# Patient Record
Sex: Female | Born: 1935 | Race: White | Hispanic: No | Smoking: Former smoker
Health system: Southern US, Community
[De-identification: ages and names within clinical notes are randomized; demographics above are authoritative.]

## PROBLEM LIST (undated history)

## (undated) DIAGNOSIS — R410 Disorientation, unspecified: Secondary | ICD-10-CM

## (undated) DIAGNOSIS — F410 Panic disorder [episodic paroxysmal anxiety] without agoraphobia: Secondary | ICD-10-CM

## (undated) DIAGNOSIS — F32A Depression, unspecified: Secondary | ICD-10-CM

## (undated) DIAGNOSIS — J449 Chronic obstructive pulmonary disease, unspecified: Secondary | ICD-10-CM

## (undated) DIAGNOSIS — M1990 Unspecified osteoarthritis, unspecified site: Secondary | ICD-10-CM

## (undated) DIAGNOSIS — N289 Disorder of kidney and ureter, unspecified: Secondary | ICD-10-CM

## (undated) DIAGNOSIS — I509 Heart failure, unspecified: Secondary | ICD-10-CM

## (undated) DIAGNOSIS — E873 Alkalosis: Secondary | ICD-10-CM

## (undated) DIAGNOSIS — F419 Anxiety disorder, unspecified: Secondary | ICD-10-CM

## (undated) DIAGNOSIS — K529 Noninfective gastroenteritis and colitis, unspecified: Secondary | ICD-10-CM

## (undated) DIAGNOSIS — M109 Gout, unspecified: Secondary | ICD-10-CM

## (undated) DIAGNOSIS — F329 Major depressive disorder, single episode, unspecified: Secondary | ICD-10-CM

## (undated) DIAGNOSIS — F0390 Unspecified dementia without behavioral disturbance: Secondary | ICD-10-CM

## (undated) DIAGNOSIS — I10 Essential (primary) hypertension: Secondary | ICD-10-CM

## (undated) HISTORY — PX: INGUINAL HERNIA REPAIR: SHX194

---

## 2003-03-04 ENCOUNTER — Emergency Department (HOSPITAL_COMMUNITY): Admission: EM | Admit: 2003-03-04 | Discharge: 2003-03-04 | Payer: Self-pay | Admitting: Emergency Medicine

## 2006-03-23 ENCOUNTER — Ambulatory Visit (HOSPITAL_COMMUNITY): Admission: RE | Admit: 2006-03-23 | Discharge: 2006-03-23 | Payer: Self-pay | Admitting: Pulmonary Disease

## 2008-06-05 ENCOUNTER — Emergency Department (HOSPITAL_COMMUNITY): Admission: EM | Admit: 2008-06-05 | Discharge: 2008-06-05 | Payer: Self-pay | Admitting: Emergency Medicine

## 2008-08-04 ENCOUNTER — Ambulatory Visit (HOSPITAL_COMMUNITY): Admission: RE | Admit: 2008-08-04 | Discharge: 2008-08-04 | Payer: Self-pay | Admitting: Pulmonary Disease

## 2008-09-04 ENCOUNTER — Ambulatory Visit: Payer: Self-pay | Admitting: Surgery

## 2008-11-06 ENCOUNTER — Ambulatory Visit (HOSPITAL_COMMUNITY): Admission: RE | Admit: 2008-11-06 | Discharge: 2008-11-06 | Payer: Self-pay | Admitting: Pulmonary Disease

## 2009-03-05 ENCOUNTER — Ambulatory Visit: Payer: Self-pay | Admitting: Surgery

## 2009-06-22 ENCOUNTER — Ambulatory Visit (HOSPITAL_COMMUNITY): Admission: RE | Admit: 2009-06-22 | Discharge: 2009-06-22 | Payer: Self-pay | Admitting: Orthopaedic Surgery

## 2009-08-30 ENCOUNTER — Emergency Department (HOSPITAL_COMMUNITY)
Admission: EM | Admit: 2009-08-30 | Discharge: 2009-08-30 | Payer: Self-pay | Source: Home / Self Care | Admitting: Emergency Medicine

## 2009-09-24 ENCOUNTER — Ambulatory Visit (HOSPITAL_COMMUNITY)
Admission: RE | Admit: 2009-09-24 | Discharge: 2009-09-24 | Payer: Self-pay | Source: Home / Self Care | Admitting: Orthopaedic Surgery

## 2010-01-17 ENCOUNTER — Ambulatory Visit (HOSPITAL_COMMUNITY)
Admission: RE | Admit: 2010-01-17 | Discharge: 2010-01-17 | Payer: Self-pay | Source: Home / Self Care | Attending: Pulmonary Disease | Admitting: Pulmonary Disease

## 2010-01-21 ENCOUNTER — Inpatient Hospital Stay (HOSPITAL_COMMUNITY)
Admission: AD | Admit: 2010-01-21 | Discharge: 2010-01-24 | Payer: Self-pay | Source: Home / Self Care | Attending: Pulmonary Disease | Admitting: Pulmonary Disease

## 2010-01-22 ENCOUNTER — Encounter (INDEPENDENT_AMBULATORY_CARE_PROVIDER_SITE_OTHER): Payer: Self-pay | Admitting: Pulmonary Disease

## 2010-01-23 LAB — COMPREHENSIVE METABOLIC PANEL
ALT: 67 U/L — ABNORMAL HIGH (ref 0–35)
AST: 54 U/L — ABNORMAL HIGH (ref 0–37)
Albumin: 3.7 g/dL (ref 3.5–5.2)
Alkaline Phosphatase: 80 U/L (ref 39–117)
BUN: 33 mg/dL — ABNORMAL HIGH (ref 6–23)
CO2: 30 mEq/L (ref 19–32)
Calcium: 9.3 mg/dL (ref 8.4–10.5)
Chloride: 87 mEq/L — ABNORMAL LOW (ref 96–112)
Creatinine, Ser: 1.11 mg/dL (ref 0.4–1.2)
GFR calc Af Amer: 57 mL/min — ABNORMAL LOW (ref >60)
GFR calc non Af Amer: 47 mL/min — ABNORMAL LOW (ref >60)
Glucose, Bld: 134 mg/dL — ABNORMAL HIGH (ref 70–99)
Potassium: 2.7 mEq/L — CL (ref 3.5–5.1)
Sodium: 130 mEq/L — ABNORMAL LOW (ref 135–145)
Total Bilirubin: 0.8 mg/dL (ref 0.3–1.2)
Total Protein: 6.6 g/dL (ref 6.0–8.3)

## 2010-01-23 LAB — BLOOD GAS, ARTERIAL
Acid-Base Excess: 3.6 mmol/L — ABNORMAL HIGH (ref 0.0–2.0)
Bicarbonate: 26.7 mEq/L — ABNORMAL HIGH (ref 20.0–24.0)
FIO2: 0.21 %
O2 Content: 21 L/min
O2 Saturation: 95.2 %
Patient temperature: 37
TCO2: 23.1 mmol/L (ref 0–100)
pCO2 arterial: 34 mmHg — ABNORMAL LOW (ref 35.0–45.0)
pH, Arterial: 7.506 — ABNORMAL HIGH (ref 7.350–7.400)
pO2, Arterial: 67.5 mmHg — ABNORMAL LOW (ref 80.0–100.0)

## 2010-01-23 LAB — URINALYSIS, ROUTINE W REFLEX MICROSCOPIC
Bilirubin Urine: NEGATIVE
Ketones, ur: NEGATIVE mg/dL
Leukocytes, UA: NEGATIVE
Nitrite: NEGATIVE
Protein, ur: 100 mg/dL — AB
Specific Gravity, Urine: 1.02 (ref 1.005–1.030)
Urine Glucose, Fasting: NEGATIVE mg/dL
Urobilinogen, UA: 0.2 mg/dL (ref 0.0–1.0)
pH: 7.5 (ref 5.0–8.0)

## 2010-01-23 LAB — BASIC METABOLIC PANEL
BUN: 30 mg/dL — ABNORMAL HIGH (ref 6–23)
CO2: 33 mEq/L — ABNORMAL HIGH (ref 19–32)
Calcium: 9.2 mg/dL (ref 8.4–10.5)
Chloride: 92 mEq/L — ABNORMAL LOW (ref 96–112)
Creatinine, Ser: 1.21 mg/dL — ABNORMAL HIGH (ref 0.4–1.2)
GFR calc Af Amer: 52 mL/min — ABNORMAL LOW (ref >60)
GFR calc non Af Amer: 43 mL/min — ABNORMAL LOW (ref >60)
Glucose, Bld: 87 mg/dL (ref 70–99)
Potassium: 2.9 mEq/L — ABNORMAL LOW (ref 3.5–5.1)
Sodium: 134 mEq/L — ABNORMAL LOW (ref 135–145)

## 2010-01-23 LAB — GLUCOSE, CAPILLARY
Glucose-Capillary: 140 mg/dL — ABNORMAL HIGH (ref 70–99)
Glucose-Capillary: 114 mg/dL — ABNORMAL HIGH (ref 70–99)
Glucose-Capillary: 96 mg/dL (ref 70–99)
Glucose-Capillary: 100 mg/dL — ABNORMAL HIGH (ref 70–99)
Glucose-Capillary: 131 mg/dL — ABNORMAL HIGH (ref 70–99)
Glucose-Capillary: 110 mg/dL — ABNORMAL HIGH (ref 70–99)

## 2010-01-23 LAB — CBC
HCT: 37 % (ref 36.0–46.0)
Hemoglobin: 12.9 g/dL (ref 12.0–15.0)
MCH: 30.1 pg (ref 26.0–34.0)
MCHC: 34.9 g/dL (ref 30.0–36.0)
MCV: 86.4 fL (ref 78.0–100.0)
Platelets: 181 10*3/uL (ref 150–400)
RBC: 4.28 MIL/uL (ref 3.87–5.11)
RDW: 14.3 % (ref 11.5–15.5)
WBC: 12.9 10*3/uL — ABNORMAL HIGH (ref 4.0–10.5)

## 2010-01-23 LAB — AMMONIA: Ammonia: 25 umol/L (ref 11–35)

## 2010-01-23 LAB — DIFFERENTIAL
Basophils Absolute: 0 10*3/uL (ref 0.0–0.1)
Basophils Relative: 0 % (ref 0–1)
Eosinophils Absolute: 0 10*3/uL (ref 0.0–0.7)
Eosinophils Relative: 0 % (ref 0–5)
Lymphocytes Relative: 6 % — ABNORMAL LOW (ref 12–46)
Lymphs Abs: 0.7 10*3/uL (ref 0.7–4.0)
Monocytes Absolute: 1.1 10*3/uL — ABNORMAL HIGH (ref 0.1–1.0)
Monocytes Relative: 8 % (ref 3–12)
Neutro Abs: 11.1 10*3/uL — ABNORMAL HIGH (ref 1.7–7.7)
Neutrophils Relative %: 86 % — ABNORMAL HIGH (ref 43–77)

## 2010-01-23 LAB — RPR: RPR Ser Ql: NONREACTIVE

## 2010-01-23 LAB — URINE MICROSCOPIC-ADD ON

## 2010-01-23 LAB — MAGNESIUM: Magnesium: 1.9 mg/dL (ref 1.5–2.5)

## 2010-01-23 LAB — TSH: TSH: 2.161 u[IU]/mL (ref 0.350–4.500)

## 2010-01-23 LAB — BRAIN NATRIURETIC PEPTIDE: Pro B Natriuretic peptide (BNP): 2350 pg/mL — ABNORMAL HIGH (ref 0.0–100.0)

## 2010-01-23 LAB — VITAMIN B12: Vitamin B-12: 934 pg/mL — ABNORMAL HIGH (ref 211–911)

## 2010-01-24 ENCOUNTER — Inpatient Hospital Stay
Admission: AD | Admit: 2010-01-24 | Discharge: 2010-03-11 | Disposition: A | Discharge status: Other Acute Inpatient Hospital | Payer: Medicare Other | Attending: Internal Medicine | Admitting: Internal Medicine

## 2010-01-28 LAB — GLUCOSE, CAPILLARY
Glucose-Capillary: 97 mg/dL (ref 70–99)
Glucose-Capillary: 128 mg/dL — ABNORMAL HIGH (ref 70–99)
Glucose-Capillary: 160 mg/dL — ABNORMAL HIGH (ref 70–99)
Glucose-Capillary: 105 mg/dL — ABNORMAL HIGH (ref 70–99)
Glucose-Capillary: 137 mg/dL — ABNORMAL HIGH (ref 70–99)

## 2010-01-28 LAB — HOMOCYSTEINE: Homocysteine: 15.4 umol/L (ref 4.0–15.4)

## 2010-01-28 LAB — BASIC METABOLIC PANEL
BUN: 22 mg/dL (ref 6–23)
BUN: 20 mg/dL (ref 6–23)
Chloride: 94 mEq/L — ABNORMAL LOW (ref 96–112)
Chloride: 96 mEq/L (ref 96–112)
GFR calc non Af Amer: 51 mL/min — ABNORMAL LOW (ref >60)
GFR calc non Af Amer: 60 mL/min — ABNORMAL LOW (ref >60)
Glucose, Bld: 86 mg/dL (ref 70–99)
Potassium: 4.2 mEq/L (ref 3.5–5.1)
Potassium: 4.5 mEq/L (ref 3.5–5.1)
Sodium: 133 mEq/L — ABNORMAL LOW (ref 135–145)
Sodium: 131 mEq/L — ABNORMAL LOW (ref 135–145)

## 2010-01-28 LAB — URINE CULTURE
Culture  Setup Time: 201201171215
Special Requests: POSITIVE

## 2010-01-28 LAB — BRAIN NATRIURETIC PEPTIDE: Pro B Natriuretic peptide (BNP): 851 pg/mL — ABNORMAL HIGH (ref 0.0–100.0)

## 2010-01-29 LAB — GLUCOSE, CAPILLARY
Glucose-Capillary: 115 mg/dL — ABNORMAL HIGH (ref 70–99)
Glucose-Capillary: 113 mg/dL — ABNORMAL HIGH (ref 70–99)
Glucose-Capillary: 107 mg/dL — ABNORMAL HIGH (ref 70–99)
Glucose-Capillary: 109 mg/dL — ABNORMAL HIGH (ref 70–99)
Glucose-Capillary: 117 mg/dL — ABNORMAL HIGH (ref 70–99)
Glucose-Capillary: 184 mg/dL — ABNORMAL HIGH (ref 70–99)
Glucose-Capillary: 109 mg/dL — ABNORMAL HIGH (ref 70–99)
Glucose-Capillary: 107 mg/dL — ABNORMAL HIGH (ref 70–99)

## 2010-01-30 LAB — GLUCOSE, CAPILLARY
Glucose-Capillary: 107 mg/dL — ABNORMAL HIGH (ref 70–99)
Glucose-Capillary: 117 mg/dL — ABNORMAL HIGH (ref 70–99)
Glucose-Capillary: 98 mg/dL (ref 70–99)
Glucose-Capillary: 106 mg/dL — ABNORMAL HIGH (ref 70–99)
Glucose-Capillary: 91 mg/dL (ref 70–99)

## 2010-01-31 LAB — GLUCOSE, CAPILLARY: Glucose-Capillary: 95 mg/dL (ref 70–99)

## 2010-02-01 LAB — GLUCOSE, CAPILLARY
Glucose-Capillary: 133 mg/dL — ABNORMAL HIGH (ref 70–99)
Glucose-Capillary: 108 mg/dL — ABNORMAL HIGH (ref 70–99)
Glucose-Capillary: 95 mg/dL (ref 70–99)

## 2010-02-02 LAB — GLUCOSE, CAPILLARY: Glucose-Capillary: 107 mg/dL — ABNORMAL HIGH (ref 70–99)
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
## 2010-02-02 NOTE — Progress Notes (Signed)
  NAMESHERITTA, Melissa [name]                ACCOUNT NO.:  [id number]  MEDICAL RECORD NO.:  [id number]          PATIENT TYPE:  INP  LOCATION:  A338                          FACILITY:  [facility]  PHYSICIAN:  Edward L. Juanetta Gosling, M.D.DATE OF BIRTH:  12-18-1925  DATE OF PROCEDURE: DATE OF DISCHARGE:                                PROGRESS NOTE   Melissa [name] was admitted yesterday with congestive heart failure.  She has got Cardiology consultation pending.  She has got a echocardiogram pending.  Dr. Gerilyn Pilgrim has seen her and his initial impression is that she may have a post-concussive confusion and that unfortunately may not resolve.  PHYSICAL EXAMINATION:  VITAL SIGNS:  Her exam now shows temperature is 98.7, pulse 84, respirations 20, blood pressure 148/84, O2 sats 96%.  She is having some difficulty with urination, so I will get a urine C and S and urinalysis and continue with all of her other treatments and follow.  I am not sure if she will be able to go home, unless she has more help than she currently does.     Edward L. Juanetta Gosling, M.D.     ELH/MEDQ  D:  01/22/2010  T:  01/22/2010  Job:  [id number]  Electronically Signed by Kari Baars M.D. on 02/02/2010 10:37:16 AM
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
## 2010-02-02 NOTE — Progress Notes (Signed)
  NAMECEYLIN, DREIBELBIS                ACCOUNT NO.:  [id number]  MEDICAL RECORD NO.:  [id number]          PATIENT TYPE:  INP  LOCATION:  A338                          FACILITY:  [facility]  PHYSICIAN:  Edward L. Juanetta Gosling, M.D.DATE OF BIRTH:  [birth year]  DATE OF PROCEDURE: DATE OF DISCHARGE:                                PROGRESS NOTE   Ms. Dandridge is admitted with confusion, congestive heart failure, and multiple other medical problems.  The help from the Cardiology and Neurology consultants is noted and appreciated.  She became more confused last night.  PHYSICAL EXAMINATION:  VITAL SIGNS:  Her exam this morning shows her temperature is 98.9, pulse 84, respirations 16, blood pressure 160/89, O2 sat 91%, weight 49.2. CHEST:  Clear. HEART:  Regular. ABDOMEN:  Soft.  She has a Foley catheter in now so that we can get accurate I and O and because she has difficulty in getting up and going to the bathroom.  My assessment then is that she is better I think.  Plan is to continue with treatments.  Her MRI did not show a stroke. She is going to have an EEG.  She is going to have continued followup. Her echocardiogram yesterday shows ejection fraction 40-45% with left ventricular hypertrophy.  This is actually somewhat better than I expected.     Edward L. Juanetta Gosling, M.D.     ELH/MEDQ  D:  01/23/2010  T:  01/24/2010  Job:  [id number]  Electronically Signed by Kari Baars M.D. on 02/02/2010 10:37:20 AM

## 2010-02-02 NOTE — Progress Notes (Signed)
  NAMESYNCERE, KAMINSKI                ACCOUNT NO.:  [id number]  MEDICAL RECORD NO.:  [id number]          PATIENT TYPE:  INP  LOCATION:  A338                          FACILITY:  [facility]  PHYSICIAN:  Niel Peretti L. Juanetta Gosling, M.D.DATE OF BIRTH:  1925-08-12  DATE OF PROCEDURE: DATE OF DISCHARGE:  01/24/2010                                PROGRESS NOTE   Ms. Laufer seems to be doing about the same.  She has remained quite confused.  She is much better as far as her congestive heart failure is concerned and her echocardiogram showed better left ventricular function than I expected.  She has no other new complaints or problems.  She says she feels some better.  She has had a EEG and we do not have the results of that back yet, but Dr. Gerilyn Pilgrim plans to read that soon.  PHYSICAL EXAMINATION:  VITAL SIGNS:  Exam today shows that her temperature is 97.4, pulse 90, respirations 20, blood pressure 152/80, O2 sats 94% on room air. CHEST:  Clearer. GENERAL:  She looks better except for her confusion.  Assessment then she has confusion which I think is multifactorial and my plan is to transfer her to skilled care facility today.  Please see discharge summary for details.     Sarea Fyfe L. Juanetta Gosling, M.D.     ELH/MEDQ  D:  01/24/2010  T:  01/25/2010  Job:  [id number]  Electronically Signed by Kari Baars M.D. on 02/02/2010 10:37:28 AM

## 2010-02-03 LAB — GLUCOSE, CAPILLARY
Glucose-Capillary: 115 mg/dL — ABNORMAL HIGH (ref 70–99)
Glucose-Capillary: 135 mg/dL — ABNORMAL HIGH (ref 70–99)

## 2010-02-04 LAB — GLUCOSE, CAPILLARY
Glucose-Capillary: 111 mg/dL — ABNORMAL HIGH (ref 70–99)
Glucose-Capillary: 120 mg/dL — ABNORMAL HIGH (ref 70–99)

## 2010-02-05 LAB — GLUCOSE, CAPILLARY
Glucose-Capillary: 95 mg/dL (ref 70–99)
Glucose-Capillary: 191 mg/dL — ABNORMAL HIGH (ref 70–99)

## 2010-02-06 LAB — GLUCOSE, CAPILLARY
Glucose-Capillary: 112 mg/dL — ABNORMAL HIGH (ref 70–99)
Glucose-Capillary: 101 mg/dL — ABNORMAL HIGH (ref 70–99)

## 2010-02-07 LAB — GLUCOSE, CAPILLARY: Glucose-Capillary: 93 mg/dL (ref 70–99)

## 2010-02-08 ENCOUNTER — Ambulatory Visit (HOSPITAL_COMMUNITY)
Admission: AD | Admit: 2010-02-08 | Discharge: 2010-02-08 | DRG: 951 | Disposition: A | Discharge status: Home / Self Care | Payer: Medicare Other | Source: Ambulatory Visit | Attending: Emergency Medicine | Admitting: Emergency Medicine

## 2010-02-08 ENCOUNTER — Emergency Department (HOSPITAL_COMMUNITY): Admission: EM | Admit: 2010-02-08 | Payer: Medicare Other | Source: Home / Self Care

## 2010-02-08 ENCOUNTER — Inpatient Hospital Stay (HOSPITAL_COMMUNITY)
Admission: AD | Admit: 2010-02-08 | Discharge: 2010-02-08 | Disposition: A | Discharge status: Home / Self Care | Payer: Medicare Other | Source: Ambulatory Visit | Attending: Emergency Medicine | Admitting: Emergency Medicine

## 2010-02-08 ENCOUNTER — Ambulatory Visit (HOSPITAL_COMMUNITY)
Admission: AD | Admit: 2010-02-08 | Discharge: 2010-02-08 | Disposition: A | Discharge status: Home / Self Care | Payer: Medicare Other | Source: Ambulatory Visit | Attending: Emergency Medicine | Admitting: Emergency Medicine

## 2010-02-08 DIAGNOSIS — S0990XA Unspecified injury of head, initial encounter: Secondary | ICD-10-CM

## 2010-02-08 DIAGNOSIS — X58XXXA Exposure to other specified factors, initial encounter: Secondary | ICD-10-CM | POA: Insufficient documentation

## 2010-02-08 DIAGNOSIS — F0390 Unspecified dementia without behavioral disturbance: Secondary | ICD-10-CM | POA: Insufficient documentation

## 2010-02-12 LAB — GLUCOSE, CAPILLARY: Glucose-Capillary: 98 mg/dL (ref 70–99)
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
## 2010-02-25 LAB — GLUCOSE, CAPILLARY: Glucose-Capillary: 115 mg/dL — ABNORMAL HIGH (ref 70–99)

## 2010-03-21 LAB — CBC
HCT: 42.8 % (ref 36.0–46.0)
Hemoglobin: 14.5 g/dL (ref 12.0–15.0)
MCHC: 34 g/dL (ref 30.0–36.0)
RBC: 4.71 MIL/uL (ref 3.87–5.11)
WBC: 7.6 10*3/uL (ref 4.0–10.5)

## 2010-03-21 LAB — DIFFERENTIAL
Basophils Relative: 1 % (ref 0–1)
Lymphocytes Relative: 16 % (ref 12–46)
Monocytes Absolute: 0.6 10*3/uL (ref 0.1–1.0)
Monocytes Relative: 8 % (ref 3–12)
Neutro Abs: 5.6 10*3/uL (ref 1.7–7.7)
Neutrophils Relative %: 74 % (ref 43–77)

## 2010-03-21 LAB — BASIC METABOLIC PANEL
Calcium: 9.1 mg/dL (ref 8.4–10.5)
GFR calc Af Amer: >60 mL/min (ref >60)
GFR calc non Af Amer: >60 mL/min (ref >60)
Glucose, Bld: 96 mg/dL (ref 70–99)
Potassium: 3.1 mEq/L — ABNORMAL LOW (ref 3.5–5.1)
Sodium: 131 mEq/L — ABNORMAL LOW (ref 135–145)
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
## 2010-05-21 NOTE — Assessment & Plan Note (Signed)
OFFICE VISIT   Melissa [name], Melissa [name]  DOB:  03-16-1925                                       09/04/2008  CHART#:[id number]   REASON FOR VISIT:  Leg pain.   HISTORY:  This is an 77 year old female seen at the request of Dr.  Juanetta Gosling for evaluation of leg pain.  The patient states that she has  been having pain in her legs for several months. She notices it when she  walks up an incline because her legs tend to give out.  The left leg is  worse than the right.  Also when she walks distances and uphill, her  legs would get tired.  These are improved with rest.  She denies  ulceration.  She denies rest pain.   The patient does have diabetes which is diet controlled.  She also  suffers from hypercholesterolemia.  Most recent LDL was 101.  She has  hypertension.  She is a smoker and continues to smoke a half pack a day.   REVIEW OF SYSTEMS:  GENERAL:  Positive for weight loss.  CARDIAC:  Negative for chest pain or chest tightness.  PULMONARY:  Negative for coughing, bronchitis or asthma.  GI:  Negative.  GU:  Positive for frequent urination.  VASCULAR:  Positive for pain in legs as well as weakness.  NEURO:  Negative.  ORTHO:  Positive for low back arthritis.  PSYCH:  Negative.  ENT:  Negative.  HEME:  Negative.   PAST MEDICAL HISTORY:  Diabetes, hypertension, hypercholesterolemia,  anxiety/depression.   PAST SURGICAL HISTORY:  Right inguinal hernia.   FAMILY HISTORY:  Negative for cardiovascular disease at an early age.   SOCIAL HISTORY:  She is a widow, retired.  She currently smokes a half  pack a day.  Does not drink alcohol.   MEDICATIONS:  Avalide, Effexor, Lopressor, Xanax and Zocor.   ALLERGIES:  None.   PHYSICAL EXAMINATION:  Vital signs:  Blood pressure 147/70, pulse is 71.  General:  She is well-appearing, in no acute distress.  HEENT:  She is  normocephalic, atraumatic.  Pupils equal.  Sclerae anicteric.  Neck:  Supple.  No JVD.  Positive  right carotid bruit.  Cardiovascular:  Regular rate and rhythm.  No murmurs.  Pulmonary:  Lungs are clear  bilaterally.  Extremities:  Warm and well-perfused.  Pedal pulses are  not palpable.  She has no tissue loss; however, there is  bruising/blistering on the left fifth, fourth and third toes, likely  secondary to her shoes.  Neurologic:  She is intact.  Psych:  She is  alert and oriented x3.  Skin:  Without rash.   DIAGNOSTIC STUDIES:  The patient comes with an ultrasound which reveals  an ankle-brachial index of 0.65 on the right and 0.44 on the left.   ASSESSMENT:  Peripheral artery disease.   PLAN:  I had a lengthy conversation with the patient regarding risk  factor modification.  We discussed the importance of foot care as well  as proper fitting shoes.  I am concerned that she already has blistering  on her left foot from poorly fitting shoes.  I have recommended she have  this addressed.  We also discussed extensively about smoking cessation  and how that will be vital to stabilizing her disease.  Her blood  pressure and cholesterol are well-controlled at  this time.  We discussed  the initiation of a walking program and what that entails and what the  activity should be like.  She understands all this.  We are going to see  how she does with smoking cessation and a formal walking program, and I  am going to see her back in 6 months.  At that time, we would consider  adding cilostazol if she is not significantly improved.  I told her that  if she developed a nonhealing wound, that she should contact me  immediately.  Since I heard a bruit on the right carotid, I am going to  send her for a carotid ultrasound today.   Jorge Ny, MD  Electronically Signed   VWB/MEDQ  D:  09/04/2008  T:  09/05/2008  Job:  [id number]   cc:   Oneal Deputy. Juanetta Gosling, M.D.

## 2010-05-21 NOTE — Procedures (Signed)
CAROTID DUPLEX EXAM   INDICATION:  Carotid bruit.   HISTORY:  Diabetes:  Yes  Cardiac:  No  Hypertension:  Yes  Smoking:  Yes  Previous Surgery:  No  CV History:  No  Amaurosis Fugax No, Paresthesias No, Hemiparesis No                                       RIGHT             LEFT  Brachial systolic pressure:         174               172  Brachial Doppler waveforms:         WNL               WNL  Vertebral direction of flow:        Antegrade         Antegrade  DUPLEX VELOCITIES (cm/sec)  CCA peak systolic                   49                64  ECA peak systolic                   175               186  ICA peak systolic                   363               102  ICA end diastolic                   91                20  PLAQUE MORPHOLOGY:                  Mixed             Calcific  PLAQUE AMOUNT:                      Moderate/severe   Mild  PLAQUE LOCATION:                    ICA/ECA/CCA       CCA/ECA/ICA   IMPRESSION:  1. Right ICA shows evidence of 60% to 79% stenosis (high end of range)      with primarily focalized homogenous stenosis in the proximal ICA.  2. Left ICA shows evidence of 20% to 39%.  3. Bilateral ECA stenosis.   ___________________________________________  V. Charlena Cross, MD   AS/MEDQ  D:  09/04/2008  T:  09/04/2008  Job:  [id number]

## 2010-05-21 NOTE — Procedures (Signed)
CAROTID DUPLEX EXAM   INDICATION:  Follow up of carotid disease.   HISTORY:  Diabetes:  Yes.  Cardiac:  No.  Hypertension:  Yes.  Smoking:  Yes.  Previous Surgery:  No.  CV History:  Asymptomatic.  Amaurosis Fugax , Paresthesias , Hemiparesis                                       RIGHT             LEFT  Brachial systolic pressure:         160               162  Brachial Doppler waveforms:         Triphasic         Triphasic  Vertebral direction of flow:        Antegrade         Antegrade  DUPLEX VELOCITIES (cm/sec)  CCA peak systolic                   59                77  ECA peak systolic                   87                123  ICA peak systolic                   312               113  ICA end diastolic                   62                33  PLAQUE MORPHOLOGY:                  Mixed             Mixed  PLAQUE AMOUNT:                      Moderate-to-severe                  Moderate  PLAQUE LOCATION:                    CCA, ICA, ECA     ICA, ECA   IMPRESSION:  1. High-grade 60-79% stenosis on the right internal carotid artery.  2. 40-59% stenosis noted in the left internal carotid artery.    ___________________________________________  V. Charlena Cross, MD   CJ/MEDQ  D:  03/05/2009  T:  03/06/2009  Job:  [id number]

## 2010-08-17 ENCOUNTER — Encounter: Payer: Self-pay | Admitting: *Deleted

## 2010-08-17 ENCOUNTER — Emergency Department (HOSPITAL_COMMUNITY)
Admission: EM | Admit: 2010-08-17 | Discharge: 2010-08-18 | Disposition: A | Discharge status: Home / Self Care | Payer: Medicare Other | Attending: Emergency Medicine | Admitting: Emergency Medicine

## 2010-08-17 DIAGNOSIS — J4489 Other specified chronic obstructive pulmonary disease: Secondary | ICD-10-CM | POA: Insufficient documentation

## 2010-08-17 DIAGNOSIS — I509 Heart failure, unspecified: Secondary | ICD-10-CM | POA: Insufficient documentation

## 2010-08-17 DIAGNOSIS — J449 Chronic obstructive pulmonary disease, unspecified: Secondary | ICD-10-CM | POA: Insufficient documentation

## 2010-08-17 DIAGNOSIS — K5289 Other specified noninfective gastroenteritis and colitis: Secondary | ICD-10-CM | POA: Insufficient documentation

## 2010-08-17 HISTORY — DX: Chronic obstructive pulmonary disease, unspecified: J44.9

## 2010-08-17 HISTORY — DX: Heart failure, unspecified: I50.9

## 2010-08-17 NOTE — ED Notes (Signed)
Diarrhea today.

## 2010-08-18 ENCOUNTER — Emergency Department (HOSPITAL_COMMUNITY): Payer: Medicare Other

## 2010-08-18 LAB — DIFFERENTIAL
Eosinophils Absolute: 0.3 10*3/uL (ref 0.0–0.7)
Lymphs Abs: 2 10*3/uL (ref 0.7–4.0)
Monocytes Absolute: 0.7 10*3/uL (ref 0.1–1.0)
Monocytes Relative: 8 % (ref 3–12)
Neutro Abs: 5.6 10*3/uL (ref 1.7–7.7)
Neutrophils Relative %: 65 % (ref 43–77)

## 2010-08-18 LAB — BASIC METABOLIC PANEL
BUN: 45 mg/dL — ABNORMAL HIGH (ref 6–23)
Chloride: 98 mEq/L (ref 96–112)
Creatinine, Ser: 1.86 mg/dL — ABNORMAL HIGH (ref 0.50–1.10)
Glucose, Bld: 100 mg/dL — ABNORMAL HIGH (ref 70–99)
Potassium: 4.8 mEq/L (ref 3.5–5.1)

## 2010-08-18 LAB — CBC
HCT: 35.6 % — ABNORMAL LOW (ref 36.0–46.0)
Hemoglobin: 11.9 g/dL — ABNORMAL LOW (ref 12.0–15.0)
MCH: 30.3 pg (ref 26.0–34.0)
RBC: 3.93 MIL/uL (ref 3.87–5.11)

## 2010-08-18 MED ORDER — SODIUM CHLORIDE 0.9 % IV BOLUS (SEPSIS)
1000.0000 mL | Freq: Once | INTRAVENOUS | Status: AC
  Administered 2010-08-18: 1000 mL via INTRAVENOUS

## 2010-08-18 MED ORDER — DIPHENOXYLATE-ATROPINE 2.5-0.025 MG PO TABS
2.0000 | ORAL_TABLET | Freq: Once | ORAL | Status: AC
  Administered 2010-08-18: 2 via ORAL
  Filled 2010-08-18: qty 2

## 2010-08-18 MED ORDER — DIPHENOXYLATE-ATROPINE 2.5-0.025 MG PO TABS: 1.0000 | ORAL_TABLET | Freq: Four times a day (QID) | ORAL | Status: AC | PRN

## 2010-08-18 NOTE — ED Provider Notes (Signed)
History     CSN: [id number] Arrival date & time: 08/17/2010 11:16 PM  Chief Complaint  Patient presents with  . Diarrhea   Patient is a 77 y.o. female presenting with diarrhea. The history is provided by the patient (The patient states she's had diarrhea all day some nausea no blood in her diarrhea she also has had abdominal cramping).  Diarrhea The primary symptoms include nausea and diarrhea. Primary symptoms do not include fever, fatigue, abdominal pain, jaundice or rash. The illness began today. The problem has not changed since onset. The illness does not include chills or back pain. Associated medical issues do not include GERD or gastric bypass.    Past Medical History  Diagnosis Date  . COPD (chronic obstructive pulmonary disease)   . CHF (congestive heart failure)     History reviewed. No pertinent past surgical history.  History reviewed. No pertinent family history.  History  Substance Use Topics  . Smoking status: Never Smoker   . Smokeless tobacco: Not on file  . Alcohol Use: No    OB History    Grav Para Term Preterm Abortions TAB SAB Ect Mult Living                  Review of Systems  Constitutional: Negative for fever, chills and fatigue.  HENT: Negative for congestion, sinus pressure and ear discharge.   Eyes: Negative for discharge.  Respiratory: Negative for cough.   Cardiovascular: Negative for chest pain.  Gastrointestinal: Positive for nausea and diarrhea. Negative for abdominal pain and jaundice.  Genitourinary: Negative for frequency and hematuria.  Musculoskeletal: Negative for back pain.  Skin: Negative for rash.  Neurological: Negative for seizures and headaches.  Hematological: Negative.   Psychiatric/Behavioral: Negative for hallucinations.    Physical Exam  BP 137/64  Pulse 61  Temp(Src) 97.8 F (36.6 C) (Oral)  Resp 18  SpO2 100%  Physical Exam  Constitutional: She is oriented to person, place, and time. She appears  well-developed.  HENT:  Head: Normocephalic and atraumatic.  Eyes: Conjunctivae and EOM are normal. No scleral icterus.  Neck: Neck supple. No thyromegaly present.  Cardiovascular: Normal rate and regular rhythm.  Exam reveals no gallop and no friction rub.   No murmur heard. Pulmonary/Chest: No stridor. She has no wheezes. She has no rales. She exhibits no tenderness.  Abdominal: She exhibits no distension. There is tenderness. There is no rebound.  Musculoskeletal: Normal range of motion. She exhibits no edema.  Lymphadenopathy:    She has no cervical adenopathy.  Neurological: She is oriented to person, place, and time. Coordination normal.  Skin: No rash noted. No erythema.  Psychiatric: She has a normal mood and affect. Her behavior is normal.    ED Course  Procedures I spoke with her doctor Dr. Juanetta [name] he agrees with followup on Monday if not improved MDM Gastroenteritis with dehydration     Results for orders placed during the hospital encounter of 08/17/10  CBC      Component Value Range   WBC 8.5  4.0 - 10.5 (K/uL)   RBC 3.93  3.87 - 5.11 (MIL/uL)   Hemoglobin 11.9 (*) 12.0 - 15.0 (g/dL)   HCT 96.2 (*) 95.2 - 46.0 (%)   MCV 90.6  78.0 - 100.0 (fL)   MCH 30.3  26.0 - 34.0 (pg)   MCHC 33.4  30.0 - 36.0 (g/dL)   RDW 84.1  32.4 - 40.1 (%)   Platelets 183  150 - 400 (K/uL)  DIFFERENTIAL      Component Value Range   Neutrophils Relative 65  43 - 77 (%)   Neutro Abs 5.6  1.7 - 7.7 (K/uL)   Lymphocytes Relative 23  12 - 46 (%)   Lymphs Abs 2.0  0.7 - 4.0 (K/uL)   Monocytes Relative 8  3 - 12 (%)   Monocytes Absolute 0.7  0.1 - 1.0 (K/uL)   Eosinophils Relative 3  0 - 5 (%)   Eosinophils Absolute 0.3  0.0 - 0.7 (K/uL)   Basophils Relative 1  0 - 1 (%)   Basophils Absolute 0.1  0.0 - 0.1 (K/uL)  BASIC METABOLIC PANEL      Component Value Range   Sodium 133 (*) 135 - 145 (mEq/L)   Potassium 4.8  3.5 - 5.1 (mEq/L)   Chloride 98  96 - 112 (mEq/L)   CO2 20  19 - 32  (mEq/L)   Glucose, Bld 100 (*) 70 - 99 (mg/dL)   BUN 45 (*) 6 - 23 (mg/dL)   Creatinine, Ser 1.61 (*) 0.50 - 1.10 (mg/dL)   Calcium 09.6  8.4 - 10.5 (mg/dL)   GFR calc non Af Amer 26 (*) >60 (mL/min)   GFR calc Af Amer 31 (*) >60 (mL/min)   Dg Abd Acute W/chest  08/18/2010  *RADIOLOGY REPORT*  Clinical Data: Pain.  Diarrhea, cramping, gas  ACUTE ABDOMEN SERIES (ABDOMEN 2 VIEW & CHEST 1 VIEW)  Comparison: Chest 01/21/2010  Findings: Emphysematous changes and scattered fibrosis in the lungs.  Normal heart size and pulmonary vascularity.  No focal airspace consolidation.  Calcified aorta.  Normal bowel gas pattern with scattered gas in nondistended small and large bowel loops.  No bowel dilatation.  No free air.  No abnormal air fluid levels.  Degenerative change and scoliosis of the lumbar spine.  Calcification and torsion of the abdominal aorta.  IMPRESSION: Emphysematous changes in the lungs.  No evidence of active pulmonary disease.  Nonobstructive bowel gas pattern.  Original Report Authenticated By: Melissa [name], M.D.          Melissa Lennert, MD 08/18/10 [phone]

## 2011-04-14 ENCOUNTER — Other Ambulatory Visit: Payer: Self-pay | Admitting: Vascular Surgery

## 2011-05-08 ENCOUNTER — Emergency Department (HOSPITAL_COMMUNITY)
Admission: EM | Admit: 2011-05-08 | Discharge: 2011-05-08 | Disposition: A | Discharge status: Home / Self Care | Payer: Medicare Other | Attending: Emergency Medicine | Admitting: Emergency Medicine

## 2011-05-08 ENCOUNTER — Encounter (HOSPITAL_COMMUNITY): Payer: Self-pay | Admitting: Emergency Medicine

## 2011-05-08 DIAGNOSIS — J449 Chronic obstructive pulmonary disease, unspecified: Secondary | ICD-10-CM | POA: Insufficient documentation

## 2011-05-08 DIAGNOSIS — I10 Essential (primary) hypertension: Secondary | ICD-10-CM | POA: Insufficient documentation

## 2011-05-08 DIAGNOSIS — F341 Dysthymic disorder: Secondary | ICD-10-CM | POA: Insufficient documentation

## 2011-05-08 DIAGNOSIS — K529 Noninfective gastroenteritis and colitis, unspecified: Secondary | ICD-10-CM

## 2011-05-08 DIAGNOSIS — I509 Heart failure, unspecified: Secondary | ICD-10-CM | POA: Insufficient documentation

## 2011-05-08 DIAGNOSIS — Z139 Encounter for screening, unspecified: Secondary | ICD-10-CM

## 2011-05-08 DIAGNOSIS — Z79899 Other long term (current) drug therapy: Secondary | ICD-10-CM | POA: Insufficient documentation

## 2011-05-08 DIAGNOSIS — E119 Type 2 diabetes mellitus without complications: Secondary | ICD-10-CM | POA: Insufficient documentation

## 2011-05-08 DIAGNOSIS — R197 Diarrhea, unspecified: Secondary | ICD-10-CM | POA: Insufficient documentation

## 2011-05-08 DIAGNOSIS — D649 Anemia, unspecified: Secondary | ICD-10-CM

## 2011-05-08 DIAGNOSIS — N289 Disorder of kidney and ureter, unspecified: Secondary | ICD-10-CM

## 2011-05-08 DIAGNOSIS — F0390 Unspecified dementia without behavioral disturbance: Secondary | ICD-10-CM | POA: Insufficient documentation

## 2011-05-08 DIAGNOSIS — J4489 Other specified chronic obstructive pulmonary disease: Secondary | ICD-10-CM | POA: Insufficient documentation

## 2011-05-08 HISTORY — DX: Anxiety disorder, unspecified: F41.9

## 2011-05-08 HISTORY — DX: Essential (primary) hypertension: I10

## 2011-05-08 HISTORY — DX: Alkalosis: E87.3

## 2011-05-08 HISTORY — DX: Noninfective gastroenteritis and colitis, unspecified: K52.9

## 2011-05-08 HISTORY — DX: Panic disorder (episodic paroxysmal anxiety): F41.0

## 2011-05-08 HISTORY — DX: Disorientation, unspecified: R41.0

## 2011-05-08 HISTORY — DX: Major depressive disorder, single episode, unspecified: F32.9

## 2011-05-08 HISTORY — DX: Depression, unspecified: F32.A

## 2011-05-08 LAB — CBC
HCT: 32.6 % — ABNORMAL LOW (ref 36.0–46.0)
Hemoglobin: 10.9 g/dL — ABNORMAL LOW (ref 12.0–15.0)
MCH: 29.8 pg (ref 26.0–34.0)
MCHC: 33.4 g/dL (ref 30.0–36.0)

## 2011-05-08 LAB — BASIC METABOLIC PANEL
CO2: 19 mEq/L (ref 19–32)
Chloride: 103 mEq/L (ref 96–112)
GFR calc non Af Amer: 17 mL/min — ABNORMAL LOW (ref >90)
Glucose, Bld: 126 mg/dL — ABNORMAL HIGH (ref 70–99)
Potassium: 5 mEq/L (ref 3.5–5.1)
Sodium: 134 mEq/L — ABNORMAL LOW (ref 135–145)

## 2011-05-08 LAB — DIFFERENTIAL
Basophils Relative: 1 % (ref 0–1)
Eosinophils Absolute: 0.4 10*3/uL (ref 0.0–0.7)
Eosinophils Relative: 5 % (ref 0–5)
Monocytes Absolute: 0.4 10*3/uL (ref 0.1–1.0)
Monocytes Relative: 6 % (ref 3–12)

## 2011-05-08 NOTE — ED Notes (Signed)
Fluids offered to assure patient tolerates.

## 2011-05-08 NOTE — ED Notes (Signed)
MD at bedside.  Rectal exam and occult blood test performed

## 2011-05-08 NOTE — ED Notes (Signed)
1610  Small amount of semiformed stool collected with sterile applicator and placed in sterile specimen container - sent to lab

## 2011-05-08 NOTE — ED Provider Notes (Signed)
History     CSN: [id number]  Arrival date & time 05/08/11  0007   First MD Initiated Contact with Patient 05/08/11 0221      Chief Complaint  Patient presents with  . Diarrhea     The history is provided by the nursing home, the EMS personnel, a relative and the patient. History Limited By: Hx dementia, poor historian.   Pt was seen at 0300.   Per NH report, family and pt, pt has hx of intermittent chronic loose stools/diarrhea, had the same symptoms per her usual yesterday at the [facility].   Pt was given Kayexalate yesterday evening for "high potassium" per PMD's order, which was followed by stooling.  Pt was given lomotil per NH orders before transport to the ED.  NH staff did not request to transport pt to the ED, pt herself requested to come to the ED to "get my diarrhea checked out."  No reported black or blood in stools, no reported N/V, no fevers.  Pt denies abd pain, no back pain, no CP/SOB.     Past Medical History  Diagnosis Date  . COPD (chronic obstructive pulmonary disease)   . CHF (congestive heart failure)   . Delirium   . Metabolic alkalosis   . Hypertension   . Diabetes mellitus   . Anxiety   . Depression   . Panic attacks   . Chronic diarrhea of unknown origin     Past Surgical History  Procedure Date  . Inguinal hernia repair     right      History  Substance Use Topics  . Smoking status: Former Games developer  . Smokeless tobacco: Not on file  . Alcohol Use: No    Review of Systems  Unable to perform ROS: Dementia     Allergies  Review of patient's allergies indicates no known allergies.  Home Medications   Current Outpatient Rx  Name Route Sig Dispense Refill  . DIPHENOXYLATE-ATROPINE 2.5-0.025 MG PO TABS Oral Take 1 tablet by mouth 4 (four) times daily as needed.    Marland Kitchen MAGNESIUM OXIDE 400 MG PO TABS Oral Take 400 mg by mouth 2 (two) times daily.    Marland Kitchen MEMANTINE HCL 5 MG PO TABS Oral Take 5 mg by mouth 2 (two) times daily.    Marland Kitchen ZOLPIDEM TARTRATE 5 MG  PO TABS Oral Take 5 mg by mouth at bedtime as needed.    Marland Kitchen AMLODIPINE BESYLATE 10 MG PO TABS Oral Take 10 mg by mouth daily.      . DONEPEZIL HCL 10 MG PO TABS Oral Take 10 mg by mouth at bedtime.      Marland Kitchen ESCITALOPRAM OXALATE 10 MG PO TABS Oral Take 10 mg by mouth daily.      . FUROSEMIDE 40 MG PO TABS Oral Take 20 mg by mouth 2 (two) times daily.     Marland Kitchen LEVETIRACETAM 250 MG PO TABS Oral Take 250 mg by mouth 2 (two) times daily.      Marland Kitchen METOPROLOL TARTRATE 50 MG PO TABS Oral Take 50 mg by mouth.      . OLMESARTAN MEDOXOMIL 40 MG PO TABS Oral Take 40 mg by mouth daily.      Marland Kitchen POTASSIUM CHLORIDE CRYS ER 20 MEQ PO TBCR Oral Take 20 mEq by mouth 2 (two) times daily.      Marland Kitchen SPIRONOLACTONE 25 MG PO TABS Oral Take 25 mg by mouth daily.      . VENLAFAXINE HCL ER 37.5 MG PO  CP24 Oral Take 37.5 mg by mouth daily.        BP 116/43  Pulse 66  Temp(Src) 97.9 F (36.6 C) (Oral)  Resp 18  Ht 5\' 1"  (1.549 m)  Wt 119 lb (53.978 kg)  BMI 22.48 kg/m2  SpO2 96%  Physical Exam 0305: Physical examination:  Nursing notes reviewed; Vital signs and O2 SAT reviewed;  Constitutional: Well developed, Well nourished, Well hydrated, In no acute distress; Head:  Normocephalic, atraumatic; Eyes: EOMI, PERRL, No scleral icterus; ENMT: Mouth and pharynx normal, Mucous membranes moist; Neck: Supple, Full range of motion, No lymphadenopathy; Cardiovascular: Regular rate and rhythm, No murmur, rub, or gallop; Respiratory: Breath sounds clear & equal bilaterally, No rales, rhonchi, wheezes, or rub, Normal respiratory effort/excursion; Chest: Nontender, Movement normal; Abdomen: Soft, Nontender, Nondistended, Normal bowel sounds; Rectal exam performed w/permission of pt and ED RN chaparone present.  Anal tone normal.  Non-tender, small amount of soft brown stool in rectal vault, heme neg.  No fissures, no external hemorrhoids, no palp masses.; Extremities: Pulses normal, No tenderness, No edema, No calf edema or asymmetry.; Neuro:  Awake, alert, poor historian, Major CN grossly intact. No facial droop, speech clear.  Moves all ext well without apparent gross focal motor deficits.; Skin: Color normal, Warm, Dry   ED Course  Procedures   0310:  ED RN spoke with staff at NH:  Pt apparently demanded NH staff transport her to the ED because she wanted to be "checked out," the NH staff stated they were not going to send pt to the ED as she was at her baseline chronic loose stooling, and there is a plan in place by PMD to recheck her labs on 5/6 at the [facility].  Pt's family at bedside, spoke with her regarding this information:  Is agreeable that we will recheck labs today and likely send back to NH.      MDM  MDM Reviewed: nursing note, previous chart and vitals Reviewed previous: labs Interpretation: labs     Results for orders placed during the hospital encounter of 05/08/11  CBC      Component Value Range   WBC 7.3  4.0 - 10.5 (K/uL)   RBC 3.66 (*) 3.87 - 5.11 (MIL/uL)   Hemoglobin 10.9 (*) 12.0 - 15.0 (g/dL)   HCT 78.2 (*) 95.6 - 46.0 (%)   MCV 89.1  78.0 - 100.0 (fL)   MCH 29.8  26.0 - 34.0 (pg)   MCHC 33.4  30.0 - 36.0 (g/dL)   RDW 21.3  08.6 - 57.8 (%)   Platelets 218  150 - 400 (K/uL)  DIFFERENTIAL      Component Value Range   Neutrophils Relative 73  43 - 77 (%)   Neutro Abs 5.3  1.7 - 7.7 (K/uL)   Lymphocytes Relative 15  12 - 46 (%)   Lymphs Abs 1.1  0.7 - 4.0 (K/uL)   Monocytes Relative 6  3 - 12 (%)   Monocytes Absolute 0.4  0.1 - 1.0 (K/uL)   Eosinophils Relative 5  0 - 5 (%)   Eosinophils Absolute 0.4  0.0 - 0.7 (K/uL)   Basophils Relative 1  0 - 1 (%)   Basophils Absolute 0.0  0.0 - 0.1 (K/uL)  BASIC METABOLIC PANEL      Component Value Range   Sodium 134 (*) 135 - 145 (mEq/L)   Potassium 5.0  3.5 - 5.1 (mEq/L)   Chloride 103  96 - 112 (mEq/L)   CO2 19  19 - 32 (mEq/L)   Glucose, Bld 126 (*) 70 - 99 (mg/dL)   BUN 54 (*) 6 - 23 (mg/dL)   Creatinine, Ser 1.47 (*) 0.50 - 1.10 (mg/dL)    Calcium 9.2  8.4 - 10.5 (mg/dL)   GFR calc non Af Amer 17 (*) >90 (mL/min)   GFR calc Af Amer 19 (*) >90 (mL/min)    Results for MARQUERITE, FORSMAN (MRN [id number]) as of 05/08/2011 05:58  Ref. Range 01/22/2010 05:32 01/23/2010 04:45 01/24/2010 04:45 08/18/2010 00:04 05/08/2011 03:31  BUN Latest Range: 6-23 mg/dL 30 (H) 22 20 45 (H) 54 (H)  Creat Latest Range: 0.50-1.10 mg/dL 8.65 (H) 7.84 6.96 2.95 (H) 2.51 (H)    Results for JALAYNA, JOSTEN (MRN [id number]) as of 05/08/2011 05:58  Ref. Range 01/21/2010 13:13 08/18/2010 00:04 05/08/2011 03:31  Hemoglobin Latest Range: 12.0-15.0 g/dL 10.2 72.5 (L) 36.6 (L)  HCT Latest Range: 36.0-46.0 % 37.0 35.6 (L) 32.6 (L)     6:18 AM:  Pt has slept most of her ED visit overnight tonight.  NAD, resps easy, abd exam remains benign.  Pt has not had BM while in the ED for the last 6 hours (only small smear and that was sent to lab for cdiff testing).  Has tol PO well without N/V, has stood and ambulated with steady gait.  Potassium normal today.  No previous labs from NH sent with pt today to know what pt's "high" potassium was or what her baseline BUN/Cr, H/H, etc is.  CO2 slightly lower today compared to previous hospital testing (previous in the ED was 20) but AG 12; likely due to chronic diarrhea and renal insuff.  BUN/Cr slightly elevated from previous, H/H also slightly lower than previous but is heme negative and without obvious outward signs of active bleeding today.  Family would like her to go back to the NH now and pt wants to go back.  Dx testing d/w pt and family.  Questions answered.  Verb understanding, agreeable to d/c home with outpt f/u.           Laray Anger, DO 05/10/11 1511

## 2011-05-08 NOTE — ED Notes (Addendum)
Discussed with Pam - nurse at [facility]. History: Saw Dr Juanetta Gosling 5/1 and labs done.  Given Kayexalate 30 gms (120 ml) at 19:00 hours 5/1 for elevated Potassium as ordered by her MD.    K-Dur RX stopped and to have K+ checked again on 5/6 per notes from Dr. Juanetta Gosling. History of chronic loose stools - has had them for more than one year.  Nurse states she did not send patient to ED tonight - the patient requested to be sent to the ED.    Given Lomotil at 11:00pm prior to transferring to the ED.

## 2011-05-08 NOTE — ED Notes (Addendum)
Patient from [facility], complaining of diarrhea off and on today. Per EMS; patient given LOMOTIL approximately 30 minutes before arrival to ED. Also complaining of lower abdominal pain.

## 2011-05-08 NOTE — ED Notes (Addendum)
Patient placed on bedpan and attempted to defecate - obvious straining in attempt to provide stool specimen - unable to provide any stool.  No diarrhea since arrival to ED.  Voided very small amount when attempting to have BM.

## 2011-05-08 NOTE — Discharge Instructions (Signed)
RESOURCE GUIDE  Dental Problems  Patients with Medicaid: [facility]                     [facility] [address]                                           [address] Phone:  [phone]                                                  Phone:  [phone]  If unable to pay or uninsured, contact:  [facility] or [facility] to become qualified for the adult dental clinic.  Chronic Pain Problems Contact [facility]  [phone] Patients need to be referred by their primary care doctor.  Insufficient Money for Medicine Contact [employer]:  call "211" or [facility] 271-5999.  No Primary Care Doctor Call [facility]  832-8000 Other agencies that provide inexpensive medical care    [facility]  [phone]    [facility]  [phone]    [facility]  [phone]    Women's Clinic  [phone]    [facility]  [phone]    [facility]  [phone]  Psychological Services [facility]  [phone] [facility]  [phone] [facility]   800 853-5163 (emergency services 641-4993)  Substance Abuse Resources [facility]  336-882-2125 [facility] 336-784-9470 [facility] 336-285-9073 [facility] 336-845-3988 Residential & Outpatient Substance Abuse Program  800-659-3381  Abuse/Neglect [place] Child Abuse Hotline (336) 641-3795 [place] Child Abuse Hotline 800-378-5315 (After Hours)  Emergency Shelter [facility] (336) 271-5985  Maternity Homes [facility] (336) 275-9566 [facility] (704) 372-4663  MRSA Hotline #:   832-7006    [place] [facility]  [facility] [place]     [employer]                          [facility] [address] [place]                       [address]      [address]  [place]                                                [place]                            [place] Phone:  [phone]                                   Phone:  [phone]                 Phone:  [phone]  [facility] Phone:  [phone]    [facility] Child Abuse Hotline 229-651-4599 213-176-6843 (After Hours)   Take your usual prescriptions as previously directed.  Your potassium level was normal today.  Call your regular medical doctor today to schedule a follow up appointment within the next 2 days to recheck your kidney functions and your hemoglobin level.  Return to the Emergency Department immediately sooner if worsening.

## 2011-05-08 NOTE — ED Notes (Addendum)
Patient had a pad in place with small amount of dried stool per tech as if perhaps had not been completely cleaned after her last BM prior to arrival in ED - dried stool on pajama's.  Cleaned and adult diaper placed on the patient.  Daughter-in-law Rafael Bihari sitting with patient.   Patient appears to be sleeping at present.

## 2011-05-08 NOTE — ED Notes (Signed)
Hemocult test NEGATIVE

## 2011-05-12 ENCOUNTER — Other Ambulatory Visit (HOSPITAL_COMMUNITY): Payer: Self-pay | Admitting: Pulmonary Disease

## 2011-05-12 DIAGNOSIS — N19 Unspecified kidney failure: Secondary | ICD-10-CM

## 2011-05-15 ENCOUNTER — Ambulatory Visit (HOSPITAL_COMMUNITY)
Admission: RE | Admit: 2011-05-15 | Discharge: 2011-05-15 | Disposition: A | Discharge status: Home / Self Care | Payer: Medicare Other | Source: Ambulatory Visit | Attending: Pulmonary Disease | Admitting: Pulmonary Disease

## 2011-05-15 DIAGNOSIS — J4489 Other specified chronic obstructive pulmonary disease: Secondary | ICD-10-CM | POA: Insufficient documentation

## 2011-05-15 DIAGNOSIS — I129 Hypertensive chronic kidney disease with stage 1 through stage 4 chronic kidney disease, or unspecified chronic kidney disease: Secondary | ICD-10-CM | POA: Insufficient documentation

## 2011-05-15 DIAGNOSIS — I714 Abdominal aortic aneurysm, without rupture, unspecified: Secondary | ICD-10-CM | POA: Insufficient documentation

## 2011-05-15 DIAGNOSIS — E119 Type 2 diabetes mellitus without complications: Secondary | ICD-10-CM | POA: Insufficient documentation

## 2011-05-15 DIAGNOSIS — N189 Chronic kidney disease, unspecified: Secondary | ICD-10-CM | POA: Insufficient documentation

## 2011-05-15 DIAGNOSIS — J449 Chronic obstructive pulmonary disease, unspecified: Secondary | ICD-10-CM | POA: Insufficient documentation

## 2011-05-15 DIAGNOSIS — I509 Heart failure, unspecified: Secondary | ICD-10-CM | POA: Insufficient documentation

## 2011-05-15 DIAGNOSIS — N19 Unspecified kidney failure: Secondary | ICD-10-CM | POA: Insufficient documentation

## 2011-06-03 ENCOUNTER — Inpatient Hospital Stay (HOSPITAL_COMMUNITY)
Admission: EM | Admit: 2011-06-03 | Discharge: 2011-06-07 | DRG: 194 | Disposition: A | Discharge status: Nursing Home (Includes ICF) | Payer: Medicare Other | Attending: Pulmonary Disease | Admitting: Pulmonary Disease

## 2011-06-03 ENCOUNTER — Emergency Department (HOSPITAL_COMMUNITY): Payer: Medicare Other

## 2011-06-03 ENCOUNTER — Encounter (HOSPITAL_COMMUNITY): Payer: Self-pay

## 2011-06-03 DIAGNOSIS — F0390 Unspecified dementia without behavioral disturbance: Secondary | ICD-10-CM | POA: Diagnosis present

## 2011-06-03 DIAGNOSIS — E119 Type 2 diabetes mellitus without complications: Secondary | ICD-10-CM | POA: Diagnosis present

## 2011-06-03 DIAGNOSIS — E876 Hypokalemia: Secondary | ICD-10-CM | POA: Diagnosis present

## 2011-06-03 DIAGNOSIS — J189 Pneumonia, unspecified organism: Principal | ICD-10-CM | POA: Diagnosis present

## 2011-06-03 DIAGNOSIS — F068 Other specified mental disorders due to known physiological condition: Secondary | ICD-10-CM | POA: Diagnosis present

## 2011-06-03 DIAGNOSIS — I5022 Chronic systolic (congestive) heart failure: Secondary | ICD-10-CM | POA: Diagnosis present

## 2011-06-03 DIAGNOSIS — I129 Hypertensive chronic kidney disease with stage 1 through stage 4 chronic kidney disease, or unspecified chronic kidney disease: Secondary | ICD-10-CM | POA: Diagnosis present

## 2011-06-03 DIAGNOSIS — I509 Heart failure, unspecified: Secondary | ICD-10-CM | POA: Diagnosis present

## 2011-06-03 DIAGNOSIS — R0902 Hypoxemia: Secondary | ICD-10-CM

## 2011-06-03 DIAGNOSIS — Z87891 Personal history of nicotine dependence: Secondary | ICD-10-CM

## 2011-06-03 DIAGNOSIS — I739 Peripheral vascular disease, unspecified: Secondary | ICD-10-CM | POA: Diagnosis present

## 2011-06-03 DIAGNOSIS — R0602 Shortness of breath: Secondary | ICD-10-CM | POA: Diagnosis present

## 2011-06-03 DIAGNOSIS — N189 Chronic kidney disease, unspecified: Secondary | ICD-10-CM | POA: Diagnosis present

## 2011-06-03 DIAGNOSIS — J4489 Other specified chronic obstructive pulmonary disease: Secondary | ICD-10-CM | POA: Diagnosis present

## 2011-06-03 DIAGNOSIS — J449 Chronic obstructive pulmonary disease, unspecified: Secondary | ICD-10-CM | POA: Diagnosis present

## 2011-06-03 DIAGNOSIS — F29 Unspecified psychosis not due to a substance or known physiological condition: Secondary | ICD-10-CM | POA: Diagnosis not present

## 2011-06-03 LAB — COMPREHENSIVE METABOLIC PANEL
Albumin: 3.7 g/dL (ref 3.5–5.2)
Alkaline Phosphatase: 163 U/L — ABNORMAL HIGH (ref 39–117)
BUN: 19 mg/dL (ref 6–23)
CO2: 24 mEq/L (ref 19–32)
Chloride: 100 mEq/L (ref 96–112)
Potassium: 3.3 mEq/L — ABNORMAL LOW (ref 3.5–5.1)
Total Bilirubin: 0.3 mg/dL (ref 0.3–1.2)

## 2011-06-03 LAB — LACTIC ACID, PLASMA: Lactic Acid, Venous: 1.2 mmol/L (ref 0.5–2.2)

## 2011-06-03 LAB — BLOOD GAS, ARTERIAL
Acid-Base Excess: 0.8 mmol/L (ref 0.0–2.0)
O2 Saturation: 92.7 %
Patient temperature: 37

## 2011-06-03 LAB — DIFFERENTIAL
Basophils Relative: 1 % (ref 0–1)
Lymphocytes Relative: 10 % — ABNORMAL LOW (ref 12–46)
Lymphs Abs: 1.2 10*3/uL (ref 0.7–4.0)
Monocytes Relative: 6 % (ref 3–12)
Neutro Abs: 10.7 10*3/uL — ABNORMAL HIGH (ref 1.7–7.7)
Neutrophils Relative %: 82 % — ABNORMAL HIGH (ref 43–77)

## 2011-06-03 LAB — URINALYSIS, ROUTINE W REFLEX MICROSCOPIC
Ketones, ur: NEGATIVE mg/dL
Leukocytes, UA: NEGATIVE
Nitrite: NEGATIVE
Protein, ur: 100 mg/dL — AB
pH: 7 (ref 5.0–8.0)

## 2011-06-03 LAB — CBC
Hemoglobin: 11.7 g/dL — ABNORMAL LOW (ref 12.0–15.0)
MCHC: 32.6 g/dL (ref 30.0–36.0)
RBC: 4.08 MIL/uL (ref 3.87–5.11)
WBC: 13 10*3/uL — ABNORMAL HIGH (ref 4.0–10.5)

## 2011-06-03 LAB — CARDIAC PANEL(CRET KIN+CKTOT+MB+TROPI)
Relative Index: 3.1 — ABNORMAL HIGH (ref 0.0–2.5)
Troponin I: <0.3 ng/mL (ref <0.30)

## 2011-06-03 LAB — D-DIMER, QUANTITATIVE: D-Dimer, Quant: 2.21 ug/mL-FEU — ABNORMAL HIGH (ref 0.00–0.48)

## 2011-06-03 LAB — URINE MICROSCOPIC-ADD ON

## 2011-06-03 LAB — PROTIME-INR: Prothrombin Time: 13.1 seconds (ref 11.6–15.2)

## 2011-06-03 MED ORDER — DIPHENOXYLATE-ATROPINE 2.5-0.025 MG PO TABS
1.0000 | ORAL_TABLET | Freq: Four times a day (QID) | ORAL | Status: DC | PRN
  Administered 2011-06-05: 1 via ORAL
  Filled 2011-06-03: qty 1

## 2011-06-03 MED ORDER — VANCOMYCIN HCL IN DEXTROSE 1-5 GM/200ML-% IV SOLN
1000.0000 mg | Freq: Once | INTRAVENOUS | Status: AC
  Administered 2011-06-03: 1000 mg via INTRAVENOUS
  Filled 2011-06-03: qty 200

## 2011-06-03 MED ORDER — LORAZEPAM 0.5 MG PO TABS
0.5000 mg | ORAL_TABLET | Freq: Four times a day (QID) | ORAL | Status: DC | PRN
  Administered 2011-06-03 – 2011-06-06 (×4): 0.5 mg via ORAL
  Filled 2011-06-03 (×4): qty 1

## 2011-06-03 MED ORDER — DONEPEZIL HCL 5 MG PO TABS
10.0000 mg | ORAL_TABLET | Freq: Every day | ORAL | Status: DC
  Administered 2011-06-03 – 2011-06-06 (×4): 10 mg via ORAL
  Filled 2011-06-03 (×4): qty 2

## 2011-06-03 MED ORDER — VANCOMYCIN HCL 500 MG IV SOLR
500.0000 mg | INTRAVENOUS | Status: DC
  Administered 2011-06-04 – 2011-06-06 (×3): 500 mg via INTRAVENOUS
  Filled 2011-06-03 (×6): qty 500

## 2011-06-03 MED ORDER — HYDROCODONE-ACETAMINOPHEN 5-325 MG PO TABS: 1.0000 | ORAL_TABLET | ORAL | Status: DC | PRN

## 2011-06-03 MED ORDER — PIPERACILLIN-TAZOBACTAM 3.375 G IVPB
3.3750 g | Freq: Once | INTRAVENOUS | Status: AC
  Administered 2011-06-03: 3.375 g via INTRAVENOUS
  Filled 2011-06-03: qty 50

## 2011-06-03 MED ORDER — IRBESARTAN 300 MG PO TABS
300.0000 mg | ORAL_TABLET | Freq: Every day | ORAL | Status: DC
  Administered 2011-06-04 – 2011-06-07 (×4): 300 mg via ORAL
  Filled 2011-06-03 (×4): qty 1

## 2011-06-03 MED ORDER — MEMANTINE HCL 10 MG PO TABS
5.0000 mg | ORAL_TABLET | Freq: Two times a day (BID) | ORAL | Status: DC
  Administered 2011-06-03 – 2011-06-07 (×9): 5 mg via ORAL
  Filled 2011-06-03 (×9): qty 1

## 2011-06-03 MED ORDER — ZOLPIDEM TARTRATE 5 MG PO TABS: 5.0000 mg | ORAL_TABLET | Freq: Every evening | ORAL | Status: DC | PRN

## 2011-06-03 MED ORDER — ACETAMINOPHEN 325 MG PO TABS: 650.0000 mg | ORAL_TABLET | Freq: Four times a day (QID) | ORAL | Status: DC | PRN

## 2011-06-03 MED ORDER — DOCUSATE SODIUM 100 MG PO CAPS
100.0000 mg | ORAL_CAPSULE | Freq: Two times a day (BID) | ORAL | Status: DC
  Administered 2011-06-03 – 2011-06-07 (×8): 100 mg via ORAL
  Filled 2011-06-03 (×8): qty 1

## 2011-06-03 MED ORDER — LEVETIRACETAM 500 MG PO TABS
250.0000 mg | ORAL_TABLET | Freq: Two times a day (BID) | ORAL | Status: DC
  Administered 2011-06-03 – 2011-06-07 (×9): 250 mg via ORAL
  Filled 2011-06-03 (×9): qty 1
  Filled 2011-06-03: qty 2

## 2011-06-03 MED ORDER — PIPERACILLIN-TAZOBACTAM 3.375 G IVPB
3.3750 g | Freq: Three times a day (TID) | INTRAVENOUS | Status: DC
  Administered 2011-06-03 – 2011-06-07 (×12): 3.375 g via INTRAVENOUS
  Filled 2011-06-03 (×18): qty 50

## 2011-06-03 MED ORDER — LEVOFLOXACIN IN D5W 500 MG/100ML IV SOLN
500.0000 mg | Freq: Once | INTRAVENOUS | Status: AC
  Administered 2011-06-03: 500 mg via INTRAVENOUS
  Filled 2011-06-03: qty 100

## 2011-06-03 MED ORDER — IPRATROPIUM BROMIDE 0.02 % IN SOLN
0.5000 mg | Freq: Four times a day (QID) | RESPIRATORY_TRACT | Status: DC
  Administered 2011-06-03 – 2011-06-07 (×15): 0.5 mg via RESPIRATORY_TRACT
  Filled 2011-06-03 (×15): qty 2.5

## 2011-06-03 MED ORDER — FUROSEMIDE 20 MG PO TABS
20.0000 mg | ORAL_TABLET | Freq: Two times a day (BID) | ORAL | Status: DC
  Administered 2011-06-03 – 2011-06-07 (×9): 20 mg via ORAL
  Filled 2011-06-03 (×9): qty 1

## 2011-06-03 MED ORDER — ENOXAPARIN SODIUM 30 MG/0.3ML ~~LOC~~ SOLN
30.0000 mg | SUBCUTANEOUS | Status: DC
  Administered 2011-06-03 – 2011-06-07 (×5): 30 mg via SUBCUTANEOUS
  Filled 2011-06-03 (×5): qty 0.3

## 2011-06-03 MED ORDER — ALBUTEROL SULFATE (5 MG/ML) 0.5% IN NEBU
2.5000 mg | INHALATION_SOLUTION | Freq: Four times a day (QID) | RESPIRATORY_TRACT | Status: DC
  Administered 2011-06-03 – 2011-06-07 (×15): 2.5 mg via RESPIRATORY_TRACT
  Filled 2011-06-03 (×15): qty 0.5

## 2011-06-03 MED ORDER — ALBUTEROL SULFATE (5 MG/ML) 0.5% IN NEBU
2.5000 mg | INHALATION_SOLUTION | Freq: Once | RESPIRATORY_TRACT | Status: AC
  Administered 2011-06-03: 2.5 mg via RESPIRATORY_TRACT
  Filled 2011-06-03: qty 0.5

## 2011-06-03 MED ORDER — METOPROLOL TARTRATE 50 MG PO TABS
50.0000 mg | ORAL_TABLET | Freq: Two times a day (BID) | ORAL | Status: DC
  Administered 2011-06-03 – 2011-06-07 (×8): 50 mg via ORAL
  Filled 2011-06-03 (×8): qty 1

## 2011-06-03 MED ORDER — ESCITALOPRAM OXALATE 10 MG PO TABS
10.0000 mg | ORAL_TABLET | Freq: Every day | ORAL | Status: DC
  Administered 2011-06-03 – 2011-06-07 (×5): 10 mg via ORAL
  Filled 2011-06-03 (×5): qty 1

## 2011-06-03 MED ORDER — ONDANSETRON HCL 4 MG PO TABS: 4.0000 mg | ORAL_TABLET | Freq: Four times a day (QID) | ORAL | Status: DC | PRN

## 2011-06-03 MED ORDER — SPIRONOLACTONE 25 MG PO TABS
25.0000 mg | ORAL_TABLET | Freq: Every day | ORAL | Status: DC
  Administered 2011-06-03 – 2011-06-04 (×2): 25 mg via ORAL
  Filled 2011-06-03 (×2): qty 1

## 2011-06-03 MED ORDER — LEVOFLOXACIN IN D5W 250 MG/50ML IV SOLN
250.0000 mg | INTRAVENOUS | Status: DC
  Administered 2011-06-04 – 2011-06-05 (×2): 250 mg via INTRAVENOUS
  Filled 2011-06-03 (×6): qty 50

## 2011-06-03 MED ORDER — ACETAMINOPHEN 650 MG RE SUPP: 650.0000 mg | Freq: Four times a day (QID) | RECTAL | Status: DC | PRN

## 2011-06-03 MED ORDER — ASPIRIN 81 MG PO CHEW
324.0000 mg | CHEWABLE_TABLET | Freq: Once | ORAL | Status: DC
  Filled 2011-06-03: qty 4

## 2011-06-03 MED ORDER — SODIUM CHLORIDE 0.9 % IV SOLN
INTRAVENOUS | Status: DC
  Administered 2011-06-03: 75 mL/h via INTRAVENOUS

## 2011-06-03 MED ORDER — ONDANSETRON HCL 4 MG/2ML IJ SOLN: 4.0000 mg | Freq: Four times a day (QID) | INTRAMUSCULAR | Status: DC | PRN

## 2011-06-03 MED ORDER — AMLODIPINE BESYLATE 5 MG PO TABS
10.0000 mg | ORAL_TABLET | Freq: Every day | ORAL | Status: DC
  Administered 2011-06-03 – 2011-06-07 (×5): 10 mg via ORAL
  Filled 2011-06-03 (×5): qty 2

## 2011-06-03 NOTE — ED Provider Notes (Signed)
History  This chart was scribed for Melissa Octave, MD by Bennett Scrape. This patient was seen in room APA01/APA01 and the patient's care was started at 7:02AM.  CSN: [id number]  Arrival date & time 06/03/11  0630   First MD Initiated Contact with Patient 06/03/11 [phone]      Chief Complaint  Patient presents with  . Chest Pain    The history is provided by the patient. No language interpreter was used.    Melissa [name] is a 78 y.o. female brought in by ambulance from [facility], who presents to the Emergency Department complaining of 2 hours of SOB with associated chest pressure and tightness that she woke up with this morning. She reports that the chest pressure is gone currently. She also states that she has had a cough and fever for a few days. Pt was treated by EMS with 1 nitro, 324 mg ASA and was given oxygen. Pt states that the oxygen improved her symptoms. She denies using oxygen at home. She denies having similar episodes of chest pressure. She denies abdominal pain, emesis and nausea as associated symptoms. She denies having sick contacts with similar symptoms. At baseline, she walks with a walker. She has a h/o COPD, CHF, HTN and diabetes. She is a former smoker but denies alcohol use.  Dr. Juanetta Gosling is PCP.    Past Medical History  Diagnosis Date  . COPD (chronic obstructive pulmonary disease)   . CHF (congestive heart failure)   . Delirium   . Metabolic alkalosis   . Hypertension   . Diabetes mellitus   . Anxiety   . Depression   . Panic attacks   . Chronic diarrhea of unknown origin     Past Surgical History  Procedure Date  . Inguinal hernia repair     right    No family history on file.  History  Substance Use Topics  . Smoking status: Former Games developer  . Smokeless tobacco: Not on file  . Alcohol Use: No     Review of Systems  A complete 10 system review of systems was obtained and all systems are negative except as noted in the HPI and PMH.     Allergies  Review of patient's allergies indicates no known allergies.  Home Medications   Current Outpatient Rx  Name Route Sig Dispense Refill  . ALUM & MAG HYDROXIDE-SIMETH 200-200-20 MG/5ML PO SUSP Oral Take 10 mLs by mouth every 4 (four) hours as needed. For indigestion    . AMLODIPINE BESYLATE 10 MG PO TABS Oral Take 10 mg by mouth daily.      Marland Kitchen DIPHENOXYLATE-ATROPINE 2.5-0.025 MG PO TABS Oral Take 1 tablet by mouth 4 (four) times daily as needed.    . DONEPEZIL HCL 10 MG PO TABS Oral Take 10 mg by mouth at bedtime.      Marland Kitchen ESCITALOPRAM OXALATE 10 MG PO TABS Oral Take 10 mg by mouth daily.      . FUROSEMIDE 20 MG PO TABS Oral Take 20 mg by mouth 2 (two) times daily.    Marland Kitchen LEVETIRACETAM 250 MG PO TABS Oral Take 250 mg by mouth 2 (two) times daily.      Marland Kitchen MAGNESIUM OXIDE 400 MG PO TABS Oral Take 400 mg by mouth 2 (two) times daily.    Marland Kitchen MEMANTINE HCL 5 MG PO TABS Oral Take 5 mg by mouth 2 (two) times daily.    Marland Kitchen METOPROLOL TARTRATE 50 MG PO TABS Oral Take 50 mg  by mouth.      . OLMESARTAN MEDOXOMIL 40 MG PO TABS Oral Take 40 mg by mouth daily.      Marland Kitchen POTASSIUM CHLORIDE CRYS ER 10 MEQ PO TBCR Oral Take 10 mEq by mouth every other day.    . SPIRONOLACTONE 25 MG PO TABS Oral Take 25 mg by mouth daily.      . VENLAFAXINE HCL ER 37.5 MG PO CP24 Oral Take 37.5 mg by mouth daily.      Marland Kitchen ZOLPIDEM TARTRATE 5 MG PO TABS Oral Take 5 mg by mouth at bedtime as needed.      Triage Vitals: BP 144/75  Temp(Src) 98.4 F (36.9 C) (Oral)  Resp 16  Ht 5\' 3"  (1.6 m)  Wt 120 lb (54.432 kg)  BMI 21.26 kg/m2  SpO2 100%  Physical Exam  Nursing note and vitals reviewed. Constitutional: She is oriented to person, place, and time. She appears well-developed and well-nourished. No distress.       Speaking in full sentences  HENT:  Head: Normocephalic and atraumatic.  Mouth/Throat: Oropharynx is clear and moist.  Eyes: Conjunctivae and EOM are normal.  Neck: Normal range of motion. Neck supple. No  tracheal deviation present.  Cardiovascular: Normal rate, regular rhythm and normal heart sounds.   Pulmonary/Chest: Effort normal. No respiratory distress. She has no wheezes.       Bibasilar rhonchi   Abdominal: Soft. She exhibits no distension.  Musculoskeletal: Normal range of motion. She exhibits no edema.  Neurological: She is alert and oriented to person, place, and time.  Skin: Skin is warm and dry.  Psychiatric: She has a normal mood and affect. Her behavior is normal.    ED Course  Procedures (including critical care time)  DIAGNOSTIC STUDIES: Oxygen Saturation is 100% on oxygen, normal by my interpretation.    COORDINATION OF CARE: 7:10AM-Discussed treatment plan which includes chest x-ray with pt and pt agreed to plan. 8:20AM-Informed pt of radiology results showing pneumonia and pt acknowledged findings.   Labs Reviewed  CBC - Abnormal; Notable for the following:    WBC 13.0 (*)    Hemoglobin 11.7 (*)    HCT 35.9 (*)    All other components within normal limits  DIFFERENTIAL - Abnormal; Notable for the following:    Neutrophils Relative 82 (*)    Neutro Abs 10.7 (*)    Lymphocytes Relative 10 (*)    All other components within normal limits  COMPREHENSIVE METABOLIC PANEL - Abnormal; Notable for the following:    Potassium 3.3 (*)    Glucose, Bld 113 (*)    Creatinine, Ser 1.29 (*)    Alkaline Phosphatase 163 (*)    GFR calc non Af Amer 37 (*)    GFR calc Af Amer 43 (*)    All other components within normal limits  CARDIAC PANEL(CRET KIN+CKTOT+MB+TROPI) - Abnormal; Notable for the following:    Relative Index 3.1 (*)    All other components within normal limits  PRO B NATRIURETIC PEPTIDE - Abnormal; Notable for the following:    Pro B Natriuretic peptide (BNP) 8515.0 (*)    All other components within normal limits  D-DIMER, QUANTITATIVE - Abnormal; Notable for the following:    D-Dimer, Quant 2.21 (*)    All other components within normal limits    URINALYSIS, ROUTINE W REFLEX MICROSCOPIC - Abnormal; Notable for the following:    Hgb urine dipstick SMALL (*)    Protein, ur 100 (*)    All  other components within normal limits  BLOOD GAS, ARTERIAL - Abnormal; Notable for the following:    pH, Arterial 7.465 (*)    pCO2 arterial 34.1 (*)    pO2, Arterial 63.2 (*)    Bicarbonate 24.2 (*)    All other components within normal limits  PROTIME-INR  CULTURE, BLOOD (ROUTINE X 2)  CULTURE, BLOOD (ROUTINE X 2)  LACTIC ACID, PLASMA  URINE MICROSCOPIC-ADD ON   Dg Chest 2 View  06/03/2011  *RADIOLOGY REPORT*  Clinical Data: Shortness of breath, chest pressure, cough and fever.  History of COPD and CHF.  CHEST - 2 VIEW  Comparison: 01/21/2010  Findings: New, asymmetric airspace opacity primarily in the right middle lobe but also likely with extension into the upper and lower lobes is suspicious for acute pneumonia.  Superimposed on this is probable mild interstitial edema with small bilateral pleural effusions.  The heart size is stable.  IMPRESSION: New right-sided pulmonary airspace disease suspicious for acute pneumonia.  Mild superimposed interstitial edema.  Original Report Authenticated By: Reola Calkins, M.D.     1. HCAP (healthcare-associated pneumonia)   2. Hypoxia       MDM  Chest pressure and shortness of breath since waking up this morning for the past 2 hours. Patient denies any heart or lung history on record review his COPD, CHF. She is not wear oxygen at home but now requires it.  HCAP with hypoxia.  Cultures obtained and broad spectrum antibiotics started.  Leukocytosis with normal lactate.  Admission discussed with Dr. Juanetta Gosling.    Date: 06/03/2011  Rate: 90  Rhythm: normal sinus rhythm and premature atrial contractions (PAC)  QRS Axis: normal  Intervals: QT prolonged  ST/T Wave abnormalities: normal  Conduction Disutrbances:none  Narrative Interpretation:   Old EKG Reviewed: unchanged     I personally  performed the services described in this documentation, which was scribed in my presence.  The recorded information has been reviewed and considered.    Melissa Octave, MD 06/03/11 [phone]

## 2011-06-03 NOTE — Progress Notes (Signed)
Patient still anxious and unable to rest. Patient is still upset about "not being able to breath this morning". Call Dr. Juanetta Gosling who ordered Ativan 0.5mg  Q6HRS PRN. Order entered. Will continue to monitor.

## 2011-06-03 NOTE — Clinical Social Work Note (Signed)
ED Mickie Kay [phone]) and Marchelle Folks from [facility] visited w patient this AM.  York Spaniel her baseline function at [facility] is up and walking w walker, occasionally using a wheelchair.  Requires assistance w showering, but can dress and eat independently.  Are willing to have patient return to their facility at discharge.   Clovis Cao Clinical Social Worker [phone])

## 2011-06-03 NOTE — ED Notes (Signed)
Attempted to call report nurse was unable to take it at this time.

## 2011-06-03 NOTE — Progress Notes (Signed)
Report Received. Patient in bed and has some anxiety. Will continue to monitor.

## 2011-06-03 NOTE — Progress Notes (Signed)
ANTIBIOTIC CONSULT NOTE - INITIAL  Pharmacy Consult for Vancomycin, Zosyn, Levaquin Indication: pneumonia  No Known Allergies  Patient Measurements: Height: 5\' 3"  (160 cm) Weight: 121 lb (54.885 kg) IBW/kg (Calculated) : 52.4   Vital Signs: Temp: 98.9 F (37.2 C) (05/28 1021) Temp src: Oral (05/28 1021) BP: 134/77 mmHg (05/28 1021) Pulse Rate: 88  (05/28 1021) Intake/Output from previous day:   Intake/Output from this shift:    Labs:  [facility] 06/03/11 0742  WBC 13.0*  HGB 11.7*  PLT 255  LABCREA --  CREATININE 1.29*   Estimated Creatinine Clearance: 27.3 ml/min (by C-G formula based on Cr of 1.29). No results found for this basename: VANCOTROUGH:2,VANCOPEAK:2,VANCORANDOM:2,GENTTROUGH:2,GENTPEAK:2,GENTRANDOM:2,TOBRATROUGH:2,TOBRAPEAK:2,TOBRARND:2,AMIKACINPEAK:2,AMIKACINTROU:2,AMIKACIN:2, in the last 72 hours   Microbiology: Recent Results (from the past 720 hour(s))  CLOSTRIDIUM DIFFICILE BY PCR     Status: Normal   Collection Time   05/08/11  6:28 AM      Component Value Range Status Comment   C difficile by pcr NEGATIVE  NEGATIVE  Final   STOOL CULTURE     Status: Normal   Collection Time   05/08/11  6:28 AM      Component Value Range Status Comment   Specimen Description STOOL   Final    Special Requests NONE   Final    Culture     Final    Value: NO SALMONELLA, SHIGELLA, CAMPYLOBACTER, OR YERSINIA ISOLATED   Report Status 05/12/2011 FINAL   Final   CULTURE, BLOOD (ROUTINE X 2)     Status: Normal (Preliminary result)   Collection Time   06/03/11  7:45 AM      Component Value Range Status Comment   Specimen Description Blood RIGHT ARM   Final    Special Requests NONE 5CC   Final    Culture NO GROWTH <24 HRS   Final    Report Status PENDING   Incomplete   CULTURE, BLOOD (ROUTINE X 2)     Status: Normal (Preliminary result)   Collection Time   06/03/11  7:46 AM      Component Value Range Status Comment   Specimen Description Blood LEFT ARM   Final    Special  Requests NONE 5CC   Final    Culture NO GROWTH <24 HRS   Final    Report Status PENDING   Incomplete     Medical History: Past Medical History  Diagnosis Date  . COPD (chronic obstructive pulmonary disease)   . CHF (congestive heart failure)   . Delirium   . Metabolic alkalosis   . Hypertension   . Diabetes mellitus   . Anxiety   . Depression   . Panic attacks   . Chronic diarrhea of unknown origin     Medications:  Scheduled:    . albuterol  2.5 mg Nebulization Once  . albuterol  2.5 mg Nebulization Q6H  . amLODipine  10 mg Oral Daily  . docusate sodium  100 mg Oral BID  . donepezil  10 mg Oral QHS  . enoxaparin  30 mg Subcutaneous Q24H  . escitalopram  10 mg Oral Daily  . furosemide  20 mg Oral BID  . ipratropium  0.5 mg Nebulization Q6H  . irbesartan  300 mg Oral Daily  . levETIRAcetam  250 mg Oral BID  . levofloxacin (LEVAQUIN) IV  250 mg Intravenous Q24H  . levofloxacin  500 mg Intravenous Once  . memantine  5 mg Oral BID  . metoprolol  50 mg Oral BID  .  piperacillin-tazobactam (ZOSYN)  IV  3.375 g Intravenous Once  . piperacillin-tazobactam (ZOSYN)  IV  3.375 g Intravenous Q8H  . spironolactone  25 mg Oral Daily  . vancomycin  500 mg Intravenous Q24H  . vancomycin  1,000 mg Intravenous Once  . DISCONTD: aspirin  324 mg Oral Once   Assessment: 78 yo F admitted from NH for HCAP. She has received initial doses of Vanc and Zosyn in ED.  Adjust medications for renal function.  Goal of Therapy:  Vancomycin trough level 15-20 mcg/ml  Plan:  1) Zosyn 3.375gm IV Q8h to be infused over 4hrs 2) Levaquin 500mg  IV x1 now, then 250mg  IV Q24h 3) Vancomycin 500mg  IV q24h 4) Check Vancomycin trough at steady state 5) Monitor renal function and cx data   Elson Clan 06/03/2011,11:14 AM

## 2011-06-03 NOTE — Plan of Care (Signed)
Problem: Phase I Progression Outcomes Goal: O2 sats > or equal 90% or at baseline Outcome: Progressing O2 sat 100% on 2L Goal: Progress activity as tolerated unless otherwise ordered Outcome: Progressing Uses walker and wheelchair at AL  Goal: Discharge plan established Outcome: Progressing Plan to d/c back to [facility]

## 2011-06-03 NOTE — ED Notes (Signed)
Pt via EMS from [facility] with complaints of chest pressure since the previous night. EMS gave 1 nitro, 324mg  ASA, and IV started 18ga left AC.
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
## 2011-06-03 NOTE — ED Notes (Signed)
Pt states EMS gave her aspirin in the ambulance on the way to the hospital. Aspirin not given.

## 2011-06-04 LAB — BASIC METABOLIC PANEL
CO2: 22 mEq/L (ref 19–32)
Calcium: 9 mg/dL (ref 8.4–10.5)
Chloride: 100 mEq/L (ref 96–112)
Creatinine, Ser: 1.43 mg/dL — ABNORMAL HIGH (ref 0.50–1.10)
Glucose, Bld: 113 mg/dL — ABNORMAL HIGH (ref 70–99)
Sodium: 136 mEq/L (ref 135–145)

## 2011-06-04 LAB — CBC
Platelets: 228 10*3/uL (ref 150–400)
RBC: 3.72 MIL/uL — ABNORMAL LOW (ref 3.87–5.11)
RDW: 13.3 % (ref 11.5–15.5)
WBC: 8.2 10*3/uL (ref 4.0–10.5)

## 2011-06-04 MED ORDER — SODIUM CHLORIDE 0.9 % IJ SOLN
INTRAMUSCULAR | Status: AC
  Administered 2011-06-04: 10 mL
  Filled 2011-06-04: qty 3

## 2011-06-04 MED ORDER — POTASSIUM CHLORIDE CRYS ER 20 MEQ PO TBCR
20.0000 meq | EXTENDED_RELEASE_TABLET | Freq: Two times a day (BID) | ORAL | Status: DC
  Administered 2011-06-04 – 2011-06-07 (×7): 20 meq via ORAL
  Filled 2011-06-04 (×7): qty 1

## 2011-06-04 MED ORDER — SODIUM CHLORIDE 0.9 % IJ SOLN
INTRAMUSCULAR | Status: AC
  Administered 2011-06-04: 13:00:00
  Filled 2011-06-04: qty 3

## 2011-06-04 MED ORDER — BIOTENE DRY MOUTH MT LIQD
15.0000 mL | Freq: Two times a day (BID) | OROMUCOSAL | Status: DC
  Administered 2011-06-04 – 2011-06-07 (×6): 15 mL via OROMUCOSAL

## 2011-06-04 NOTE — Progress Notes (Signed)
UR chart review completed.  

## 2011-06-04 NOTE — Progress Notes (Signed)
06/04/11 1216 Notified Dr Juanetta Gosling of K+ 2.8, orders received and placed in chart. Nursing to monitor.

## 2011-06-04 NOTE — Progress Notes (Signed)
Patient sats at beginning   of the shift was 83% on 2lpm [place] she c/o sob so I gave her nebulizer treatments, sats through the breathing treatment only came up to 88% so placed pt on 50% venti mask and with deep breathimg and cough her sats are now 94% Nurse was notifed of patient and changes.

## 2011-06-04 NOTE — Progress Notes (Signed)
Melissa, [name]                ACCOUNT NO.:  [id number]  MEDICAL RECORD NO.:  [id number]  LOCATION:  A304                          FACILITY:  [facility]  PHYSICIAN:  Samiya Mervin L. Juanetta Gosling, M.D.DATE OF BIRTH:  January 07, 1926  DATE OF PROCEDURE: DATE OF DISCHARGE:                                PROGRESS NOTE   SUBJECTIVE:  [name] is an 78 year old who is admitted with pneumonia.  She has dementia, congestive heart failure, chronic renal failure.  PHYSICAL EXAMINATION:  GENERAL:  She is awake and alert.  She is still mildly short of breath. HEART:  Regular. ABDOMEN:  Soft. EXTREMITIES:  No edema. CENTRAL NERVOUS SYSTEM:  She is confused, but otherwise okay. VITAL SIGNS:  Per the E-chart.  ASSESSMENT:  She has pneumonia and multiple other medical problems.  PLAN:  To continue with her current treatments including antibiotics, IV fluids, inhaled bronchodilators, etc. and see how she does.     Saiya Crist L. Juanetta Gosling, M.D.     ELH/MEDQ  D:  06/04/2011  T:  06/04/2011  Job:  [id number]

## 2011-06-04 NOTE — H&P (Signed)
Melissa [name] MRN: [id number] DOB/AGE: 02-25-1925 78 y.o. Primary Care Physician:ROBSON,MICHAEL [name], MD, MD Admit date: 06/03/2011 Chief Complaint: Shortness of breath HPI: This 78 year old Caucasian female came to the emergency because of shortness of breath. She has a long known history of COPD. She also has congestive heart failure. When she was seen in the emergency room she was hypoxic and chest x-ray revealed a pneumonia. She has been coughing some but denies any fever or chills.  Past Medical History  Diagnosis Date  . COPD (chronic obstructive pulmonary disease)   . CHF (congestive heart failure)   . Delirium   . Metabolic alkalosis   . Hypertension   . Diabetes mellitus   . Anxiety   . Depression   . Panic attacks   . Chronic diarrhea of unknown origin    Past Surgical History  Procedure Date  . Inguinal hernia repair     right        No family history on file.  Social History:  reports that she has quit smoking. She does not have any smokeless tobacco history on file. She reports that she does not drink alcohol or use illicit drugs.   Allergies: No Known Allergies  Medications Prior to Admission  Medication Sig Dispense Refill  . alum & mag hydroxide-simeth (MAALOX/MYLANTA) 200-200-20 MG/5ML suspension Take 10 mLs by mouth every 4 (four) hours as needed. For indigestion      . amLODipine (NORVASC) 10 MG tablet Take 10 mg by mouth daily.        . diphenoxylate-atropine (LOMOTIL) 2.5-0.025 MG per tablet Take 1 tablet by mouth 4 (four) times daily as needed.      . donepezil (ARICEPT) 10 MG tablet Take 10 mg by mouth at bedtime.        Marland Kitchen escitalopram (LEXAPRO) 10 MG tablet Take 10 mg by mouth daily.        . furosemide (LASIX) 20 MG tablet Take 20 mg by mouth 2 (two) times daily.      Marland Kitchen levETIRAcetam (KEPPRA) 250 MG tablet Take 250 mg by mouth 2 (two) times daily.        . magnesium oxide (MAG-OX) 400 MG tablet Take 400 mg by mouth 2 (two) times daily.      .  memantine (NAMENDA) 5 MG tablet Take 5 mg by mouth 2 (two) times daily.      . metoprolol (LOPRESSOR) 50 MG tablet Take 50 mg by mouth.        . olmesartan (BENICAR) 40 MG tablet Take 40 mg by mouth daily.        . potassium chloride (K-DUR,KLOR-CON) 10 MEQ tablet Take 10 mEq by mouth every other day.      . spironolactone (ALDACTONE) 25 MG tablet Take 25 mg by mouth daily.        Marland Kitchen venlafaxine (EFFEXOR-XR) 37.5 MG 24 hr capsule Take 37.5 mg by mouth daily.        Marland Kitchen zolpidem (AMBIEN) 5 MG tablet Take 5 mg by mouth at bedtime as needed.           XBM:WUXLK from the symptoms mentioned above,there are no other symptoms referable to all systems reviewed.  Physical Exam: Blood pressure 132/70, pulse 79, temperature 97.6 F (36.4 C), temperature source Oral, resp. rate 20, height 5\' 3"  (1.6 m), weight 61 kg (134 lb 7.7 oz), SpO2 92.00%. She is awake and alert and mildly confused. Her pupils are reactive. Her nose and throat are clear. Her  neck is supple. Her chest is with rales bilaterally more on the right than on the left. Her heart is regular without gallop. Her abdomen is soft bowel sounds are present and active she has no tenderness. Extremities showed no edema. Central nervous system examination is grossly intact but she is mildly confused. She is in mild respiratory discomfort and looks dyspneic at rest    [facility] 06/04/11 0526 06/03/11 0742  WBC 8.2 13.0*  NEUTROABS -- 10.7*  HGB 10.8* 11.7*  HCT 32.8* 35.9*  MCV 88.2 88.0  PLT 228 255    Basename 06/03/11 0742  NA 135  K 3.3*  CL 100  CO2 24  GLUCOSE 113*  BUN 19  CREATININE 1.29*  CALCIUM 9.8  MG --  lablast2(ast:2,ALT:2,alkphos:2,bilitot:2,prot:2,albumin:2)@    Recent Results (from the past 240 hour(s))  CULTURE, BLOOD (ROUTINE X 2)     Status: Normal (Preliminary result)   Collection Time   06/03/11  7:45 AM      Component Value Range Status Comment   Specimen Description Blood RIGHT ARM   Final    Special  Requests NONE 5CC   Final    Culture NO GROWTH <24 HRS   Final    Report Status PENDING   Incomplete   CULTURE, BLOOD (ROUTINE X 2)     Status: Normal (Preliminary result)   Collection Time   06/03/11  7:46 AM      Component Value Range Status Comment   Specimen Description Blood LEFT ARM   Final    Special Requests NONE 5CC   Final    Culture NO GROWTH <24 HRS   Final    Report Status PENDING   Incomplete      Dg Chest 2 View  06/03/2011  *RADIOLOGY REPORT*  Clinical Data: Shortness of breath, chest pressure, cough and fever.  History of COPD and CHF.  CHEST - 2 VIEW  Comparison: 01/21/2010  Findings: New, asymmetric airspace opacity primarily in the right middle lobe but also likely with extension into the upper and lower lobes is suspicious for acute pneumonia.  Superimposed on this is probable mild interstitial edema with small bilateral pleural effusions.  The heart size is stable.  IMPRESSION: New right-sided pulmonary airspace disease suspicious for acute pneumonia.  Mild superimposed interstitial edema.  Original Report Authenticated By: Reola Calkins, M.D.   US Renal  05/15/2011  *RADIOLOGY REPORT*  Clinical Data:  Renal failure, history diabetes, hypertension, CHF, COPD  RENAL/URINARY TRACT ULTRASOUND COMPLETE  Comparison:  None Correlation:  MRI lumbar spine 06/22/2009  Findings:  Right Kidney:  9.1 cm length.  Normal cortical thickness. Increased cortical echogenicity.  No mass, hydronephrosis, or shadowing calcification.  No perinephric fluid.  Left Kidney:  9.8 cm length.  Cortical thinning.  Increased cortical echogenicity.  No gross mass, hydronephrosis or shadowing calcification.  Left kidney is less well visualized due to its increased echogenicity, similar to that of perinephric fat.  Bladder:  Decompressed, inadequately evaluated  Other findings:  Abdominal aortic aneurysm at mid abdominal aorta, 4.1 x 4.5 cm in greatest axial dimensions extending 4.7 cm length. An additional  distal focal area of aneurysmal dilatation is seen 2.7 x 2.9 cm just above the bifurcation.  IMPRESSION: Medical renal disease changes of both kidneys. Abdominal aortic aneurysm 4.1 x 4.5 cm in size extending 4.7 cm length, with additional focal dilatation of the distal aorta 2.9 x 2.7 cm greatest diameter just above the aortic bifurcation. Abdominal aortic aneurysm has increased in  size since the prior MRI of 06/22/2009 when it measured 3.5 cm greatest diameter.  Findings called to Dr. Juanetta Gosling on 05/15/2011 at 0952 hours.  Original Report Authenticated By: Lollie Marrow, M.D.   Impression: She has pneumonia. She has COPD. She has multiple other medical problems. She has recently developed some problems with chronic renal failure and hyperkalemia her potassium level is slightly low Active Problems:  * No active hospital problems. *      Plan: She will be admitted and treated with IV antibiotics.      Dede Dobesh L Pager [phone]  06/04/2011, 8:37 AM

## 2011-06-04 NOTE — Progress Notes (Signed)
ANTIBIOTIC CONSULT NOTE   Pharmacy Consult for Vancomycin, Zosyn, Levaquin Indication: pneumonia  No Known Allergies  Patient Measurements: Height: 5\' 3"  (160 cm) Weight: 134 lb 7.7 oz (61 kg) IBW/kg (Calculated) : 52.4   Vital Signs: Temp: 97.6 F (36.4 C) (05/29 0442) Temp src: Oral (05/29 0442) BP: 132/70 mmHg (05/29 0442) Pulse Rate: 79  (05/29 0442) Intake/Output from previous day: 05/28 0701 - 05/29 0700 In: 50 [P.O.:50] Out: 351 [Urine:350; Stool:1] Intake/Output from this shift: Total I/O In: 320 [P.O.:320] Out: 200 [Urine:200]  Labs:  [facility] 06/04/11 0526 06/03/11 0742  WBC 8.2 13.0*  HGB 10.8* 11.7*  PLT 228 255  LABCREA -- --  CREATININE -- 1.29*   Estimated Creatinine Clearance: 27.3 ml/min (by C-G formula based on Cr of 1.29). No results found for this basename: VANCOTROUGH:2,VANCOPEAK:2,VANCORANDOM:2,GENTTROUGH:2,GENTPEAK:2,GENTRANDOM:2,TOBRATROUGH:2,TOBRAPEAK:2,TOBRARND:2,AMIKACINPEAK:2,AMIKACINTROU:2,AMIKACIN:2, in the last 72 hours   Microbiology: Recent Results (from the past 720 hour(s))  CLOSTRIDIUM DIFFICILE BY PCR     Status: Normal   Collection Time   05/08/11  6:28 AM      Component Value Range Status Comment   C difficile by pcr NEGATIVE  NEGATIVE  Final   STOOL CULTURE     Status: Normal   Collection Time   05/08/11  6:28 AM      Component Value Range Status Comment   Specimen Description STOOL   Final    Special Requests NONE   Final    Culture     Final    Value: NO SALMONELLA, SHIGELLA, CAMPYLOBACTER, OR YERSINIA ISOLATED   Report Status 05/12/2011 FINAL   Final   CULTURE, BLOOD (ROUTINE X 2)     Status: Normal (Preliminary result)   Collection Time   06/03/11  7:45 AM      Component Value Range Status Comment   Specimen Description BLOOD RIGHT ARM   Final    Special Requests BOTTLES DRAWN AEROBIC AND ANAEROBIC 5CC   Final    Culture NO GROWTH 1 DAY   Final    Report Status PENDING   Incomplete   CULTURE, BLOOD (ROUTINE X 2)      Status: Normal (Preliminary result)   Collection Time   06/03/11  7:46 AM      Component Value Range Status Comment   Specimen Description BLOOD LEFT ARM   Final    Special Requests BOTTLES DRAWN AEROBIC AND ANAEROBIC 5CC   Final    Culture NO GROWTH 1 DAY   Final    Report Status PENDING   Incomplete    Medical History: Past Medical History  Diagnosis Date  . COPD (chronic obstructive pulmonary disease)   . CHF (congestive heart failure)   . Delirium   . Metabolic alkalosis   . Hypertension   . Diabetes mellitus   . Anxiety   . Depression   . Panic attacks   . Chronic diarrhea of unknown origin    Medications:  Scheduled:     . albuterol  2.5 mg Nebulization Q6H  . amLODipine  10 mg Oral Daily  . antiseptic oral rinse  15 mL Mouth Rinse BID  . docusate sodium  100 mg Oral BID  . donepezil  10 mg Oral QHS  . enoxaparin  30 mg Subcutaneous Q24H  . escitalopram  10 mg Oral Daily  . furosemide  20 mg Oral BID  . ipratropium  0.5 mg Nebulization Q6H  . irbesartan  300 mg Oral Daily  . levETIRAcetam  250 mg Oral BID  .  levofloxacin (LEVAQUIN) IV  250 mg Intravenous Q24H  . levofloxacin  500 mg Intravenous Once  . memantine  5 mg Oral BID  . metoprolol  50 mg Oral BID  . piperacillin-tazobactam (ZOSYN)  IV  3.375 g Intravenous Q8H  . sodium chloride      . spironolactone  25 mg Oral Daily  . vancomycin  500 mg Intravenous Q24H   Assessment: 78 yo F admitted from NH for HCAP. She has received initial doses of Vanc and Zosyn in ED.  Adjust medications for renal function.  Goal of Therapy:  Vancomycin trough level 15-20 mcg/ml  Plan:  1) Zosyn 3.375gm IV Q8h to be infused over 4hrs 2) Levaquin 500mg  IV x1 initially, then 250mg  IV Q24h 3) Vancomycin 500mg  IV q24h 4) Check Vancomycin trough at steady state 5) Monitor renal function and cx data   Valrie Hart A 06/04/2011,10:45 AM

## 2011-06-04 NOTE — H&P (Signed)
Melissa [name], Melissa [name]                ACCOUNT NO.:  [id number]  MEDICAL RECORD NO.:  [id number]  LOCATION:                                 FACILITY:  PHYSICIAN:  Dillan Candela L. Juanetta Gosling, M.D.DATE OF BIRTH:  February 26, 1925  DATE OF ADMISSION:  06/03/2011 DATE OF DISCHARGE:  LH                             HISTORY & PHYSICAL   REASON FOR ADMISSION:  Pneumonia.  HISTORY:  [name] is an 78 year old, who came to the emergency room because of shortness of breath.  This had been going on for about 2 hours.  She has had some chest discomfort as well.  She has been very anxious.  She has been coughing some.  Her past medical history is positive for COPD, CHF, hypertension, diabetes, chronic renal failure, and peripheral vascular disease.  Her social history, she has had about an 80-pack year smoking history, but stopped about a year ago.  She lives at an assisted living center.  Her family history is negative for any sort of lung disease.  Her review of systems except as mentioned is negative.  PHYSICAL EXAMINATION:  GENERAL:  Shows a well-developed, well-nourished female who is in no acute distress. HEENT:  Her pupils are reactive.  Nose and throat are clear.  Mucous membranes are moist. NECK:  Supple without masses. CHEST:  Shows rhonchi bilaterally. HEART:  Regular. ABDOMEN:  Soft. EXTREMITIES:  Showed no edema. CENTRAL NERVOUS SYSTEM:  Grossly intact except she does have some dementia.  Assessment is that she has multiple medical problems, has pneumonia by chest x-ray.  She has COPD.  She has some dementia.  She has chronic renal failure, peripheral arterial disease, and has had some problems with her potassium level.  She has diabetes.  Plan is for her to be admitted, started on IV antibiotics.  Continue with all of the other treatments and follow.     Greta Yung L. Juanetta Gosling, M.D.     ELH/MEDQ  D:  06/03/2011  T:  06/03/2011  Job:  [id number]

## 2011-06-04 NOTE — Care Management Note (Unsigned)
    Page 1 of 1   06/04/2011     11:01:21 AM   CARE MANAGEMENT NOTE 06/04/2011  Patient:  RYLEI, MASELLA   Account Number:  [id number]  Date Initiated:  06/04/2011  Documentation initiated by:  Sharrie Rothman  Subjective/Objective Assessment:   Pt admitted from [facility] with pneumonia and will return at discharge. Pt has a son who is very active in pts daily life.     Action/Plan:   No CM needs noted at this time. Will continue to monitor. CSW is aware of pts admission and will arrange discharge to facility when pt is medically stable.   Anticipated DC Date:  06/10/2011   Anticipated DC Plan:  ASSISTED LIVING / REST HOME  In-house referral  Clinical Social Worker      DC Planning Services  CM consult      Choice offered to / List presented to:             Status of service:  In process, will continue to follow Medicare Important Message given?   (If response is "NO", the following Medicare IM given date fields will be blank) Date Medicare IM given:   Date Additional Medicare IM given:    Discharge Disposition:    Per UR Regulation:    If discussed at Long Length of Stay Meetings, dates discussed:    Comments:  06/04/11 1100 Arlyss Queen, RN BSN CM

## 2011-06-05 DIAGNOSIS — J189 Pneumonia, unspecified organism: Secondary | ICD-10-CM | POA: Diagnosis present

## 2011-06-05 DIAGNOSIS — J449 Chronic obstructive pulmonary disease, unspecified: Secondary | ICD-10-CM | POA: Diagnosis present

## 2011-06-05 DIAGNOSIS — I5022 Chronic systolic (congestive) heart failure: Secondary | ICD-10-CM | POA: Diagnosis present

## 2011-06-05 DIAGNOSIS — F0390 Unspecified dementia without behavioral disturbance: Secondary | ICD-10-CM | POA: Diagnosis present

## 2011-06-05 DIAGNOSIS — E876 Hypokalemia: Secondary | ICD-10-CM | POA: Diagnosis present

## 2011-06-05 DIAGNOSIS — N189 Chronic kidney disease, unspecified: Secondary | ICD-10-CM | POA: Diagnosis present

## 2011-06-05 DIAGNOSIS — I739 Peripheral vascular disease, unspecified: Secondary | ICD-10-CM | POA: Diagnosis present

## 2011-06-05 LAB — BASIC METABOLIC PANEL
CO2: 23 mEq/L (ref 19–32)
Chloride: 103 mEq/L (ref 96–112)
GFR calc Af Amer: 37 mL/min — ABNORMAL LOW (ref >90)
Sodium: 135 mEq/L (ref 135–145)

## 2011-06-05 LAB — CLOSTRIDIUM DIFFICILE BY PCR: Toxigenic C. Difficile by PCR: NEGATIVE

## 2011-06-05 NOTE — Progress Notes (Signed)
06/05/11 1526 Patient having some soft stool frequently with urination, stated discussed with dr Juanetta Gosling. Colace held this morning. Patient states continues to have frequent stool with urination this afternoon, no foul odor and not loose at this time. Notified Dr Juanetta Gosling, orders received to check c-diff PCR. Nursing to monitor.

## 2011-06-05 NOTE — Evaluation (Signed)
Physical Therapy Evaluation Patient Details Name: Melissa [name] MRN: [id number] DOB: 01-11-1925 Today's Date: 06/05/2011 Time: 1110-1201 PT Time Calculation (min): 51 min  PT Assessment / Plan / Recommendation Clinical Impression  Pt is extremely pleasant and cooperative, but easily agitated.  Although her respiratory status is quite compromised, functionally she is probably close to her baseline.  I am sure that she should be able to transition beck to ACLF at d.c    PT Assessment  Patient needs continued PT services    Follow Up Recommendations  Home health PT    Barriers to Discharge None      lEquipment Recommendations  None recommended by PT    Recommendations for Other Services     Frequency Min 3X/week    Precautions / Restrictions Precautions Precautions: Fall Restrictions Weight Bearing Restrictions: No   Pertinent Vitals/Pain       Mobility  Bed Mobility Bed Mobility: Supine to Sit;Sit to Supine Supine to Sit: 6: Modified independent (Device/Increase time) Sit to Supine: 6: Modified independent (Device/Increase time) Transfers Transfers: Sit to Stand;Stand to [employer] Transfers Sit to Stand: 5: Supervision;With upper extremity assist;From bed;From chair/3-in-1 Stand to Sit: 5: Supervision;With upper extremity assist;To chair/3-in-1 Stand Pivot Transfers: 5: Supervision;With armrests Ambulation/Gait Ambulation/Gait Assistance: 5: Supervision Ambulation Distance (Feet): 150 Feet Assistive device: Rolling walker Gait Pattern: Trunk flexed Gait velocity: WNL, but needed to stop and rest 3 times General Gait Details: posture tends to be kyphotic, but is able to correct for this with v.c. Stairs: No Wheelchair Mobility Wheelchair Mobility: No    Exercises     PT Diagnosis: Difficulty walking  PT Problem List: Decreased activity tolerance;Decreased mobility;Cardiopulmonary status limiting activity PT Treatment Interventions: Gait  training;Therapeutic activities   PT Goals Acute Rehab PT Goals PT Goal Formulation: With patient Time For Goal Achievement: 06/12/11 Potential to Achieve Goals: Good Pt will Ambulate: >150 feet;with modified independence;with rolling walker PT Goal: Ambulate - Progress: Goal set today  Visit Information  Last PT Received On: 06/05/11    Subjective Data  Subjective: I don't know how I got this pneumonia Patient Stated Goal: none stated   Prior Functioning  Home Living Available Help at Discharge: Personal care attendant Type of Home: Assisted living Home Access: Level entry Home Layout: One level Home Adaptive Equipment: Walker - rolling;Wheelchair - manual Additional Comments: uses the w/c frequently, per pt Prior Function Level of Independence: Needs assistance Able to Take Stairs?: No Driving: No Vocation: Retired Musician: No difficulties    Cognition  Overall Cognitive Status: History of cognitive impairments - at baseline Arousal/Alertness: Awake/alert Orientation Level: Appears intact for tasks assessed Behavior During Session: [facility] for tasks performed    Extremity/Trunk Assessment Right Upper Extremity Assessment RUE ROM/Strength/Tone: WFL for tasks assessed RUE Sensation: WFL - Light Touch;WFL - Proprioception RUE Coordination: WFL - gross motor Left Upper Extremity Assessment LUE ROM/Strength/Tone: WFL for tasks assessed LUE Sensation: WFL - Light Touch;WFL - Proprioception LUE Coordination: WFL - gross motor Right Lower Extremity Assessment RLE ROM/Strength/Tone: WFL for tasks assessed RLE Sensation: WFL - Proprioception;WFL - Light Touch RLE Coordination: WFL - gross motor Left Lower Extremity Assessment LLE ROM/Strength/Tone: WFL for tasks assessed LLE Sensation: WFL - Light Touch;WFL - Proprioception LLE Coordination: WFL - gross motor Trunk Assessment Trunk Assessment: Normal   Balance Balance Balance Assessed: No  End of  Session PT - End of Session Equipment Utilized During Treatment: Gait belt Activity Tolerance: Patient tolerated treatment well Patient left: in chair;with call  bell/phone within reach;with chair alarm set Nurse Communication: Mobility status   Konrad Penta 06/05/2011, 12:10 PM

## 2011-06-05 NOTE — Progress Notes (Signed)
ANTIBIOTIC CONSULT NOTE   Pharmacy Consult for Vancomycin, Zosyn, Levaquin Indication: pneumonia  No Known Allergies  Patient Measurements: Height: 5\' 3"  (160 cm) Weight: 139 lb 8.8 oz (63.3 kg) IBW/kg (Calculated) : 52.4   Vital Signs: Temp: 98.1 F (36.7 C) (05/30 8657) Temp src: Oral (05/30 0637) BP: 129/75 mmHg (05/30 0637) Pulse Rate: 100  (05/30 0637) Intake/Output from previous day: 05/29 0701 - 05/30 0700 In: 1035 [P.O.:560; I.V.:225; IV Piggyback:250] Out: 1350 [Urine:1350] Intake/Output from this shift: Total I/O In: 450 [P.O.:200; I.V.:100; IV Piggyback:150] Out: 450 [Urine:450]  Labs:  Basename 06/05/11 0535 06/04/11 0526 06/03/11 0742  WBC -- 8.2 13.0*  HGB -- 10.8* 11.7*  PLT -- 228 255  LABCREA -- -- --  CREATININE 1.46* 1.43* 1.29*   Estimated Creatinine Clearance: 26.2 ml/min (by C-G formula based on Cr of 1.46). No results found for this basename: VANCOTROUGH:2,VANCOPEAK:2,VANCORANDOM:2,GENTTROUGH:2,GENTPEAK:2,GENTRANDOM:2,TOBRATROUGH:2,TOBRAPEAK:2,TOBRARND:2,AMIKACINPEAK:2,AMIKACINTROU:2,AMIKACIN:2, in the last 72 hours   Microbiology: Recent Results (from the past 720 hour(s))  CLOSTRIDIUM DIFFICILE BY PCR     Status: Normal   Collection Time   05/08/11  6:28 AM      Component Value Range Status Comment   C difficile by pcr NEGATIVE  NEGATIVE  Final   STOOL CULTURE     Status: Normal   Collection Time   05/08/11  6:28 AM      Component Value Range Status Comment   Specimen Description STOOL   Final    Special Requests NONE   Final    Culture     Final    Value: NO SALMONELLA, SHIGELLA, CAMPYLOBACTER, OR YERSINIA ISOLATED   Report Status 05/12/2011 FINAL   Final   CULTURE, BLOOD (ROUTINE X 2)     Status: Normal (Preliminary result)   Collection Time   06/03/11  7:45 AM      Component Value Range Status Comment   Specimen Description BLOOD RIGHT ARM   Final    Special Requests BOTTLES DRAWN AEROBIC AND ANAEROBIC 5CC   Final    Culture NO  GROWTH 1 DAY   Final    Report Status PENDING   Incomplete   CULTURE, BLOOD (ROUTINE X 2)     Status: Normal (Preliminary result)   Collection Time   06/03/11  7:46 AM      Component Value Range Status Comment   Specimen Description BLOOD LEFT ARM   Final    Special Requests BOTTLES DRAWN AEROBIC AND ANAEROBIC 5CC   Final    Culture NO GROWTH 1 DAY   Final    Report Status PENDING   Incomplete    Medical History: Past Medical History  Diagnosis Date  . COPD (chronic obstructive pulmonary disease)   . CHF (congestive heart failure)   . Delirium   . Metabolic alkalosis   . Hypertension   . Diabetes mellitus   . Anxiety   . Depression   . Panic attacks   . Chronic diarrhea of unknown origin    Medications:  Scheduled:     . albuterol  2.5 mg Nebulization Q6H  . amLODipine  10 mg Oral Daily  . antiseptic oral rinse  15 mL Mouth Rinse BID  . docusate sodium  100 mg Oral BID  . donepezil  10 mg Oral QHS  . enoxaparin  30 mg Subcutaneous Q24H  . escitalopram  10 mg Oral Daily  . furosemide  20 mg Oral BID  . ipratropium  0.5 mg Nebulization Q6H  . irbesartan  300 mg  Oral Daily  . levETIRAcetam  250 mg Oral BID  . levofloxacin (LEVAQUIN) IV  250 mg Intravenous Q24H  . memantine  5 mg Oral BID  . metoprolol  50 mg Oral BID  . piperacillin-tazobactam (ZOSYN)  IV  3.375 g Intravenous Q8H  . potassium chloride  20 mEq Oral BID  . sodium chloride      . vancomycin  500 mg Intravenous Q24H  . DISCONTD: spironolactone  25 mg Oral Daily   Assessment: 78 yo F admitted from NH for HCAP. She has received initial doses of Vanc and Zosyn in ED.  Adjust medications for renal function.  Goal of Therapy:  Vancomycin trough level 15-20 mcg/ml  Plan:  1) Zosyn 3.375gm IV Q8h to be infused over 4hrs 2) Levaquin 500mg  IV x1 initially, then 250mg  IV Q24h 3) Vancomycin 500mg  IV q24h 4) Check Vancomycin trough tomorrow 5) Monitor renal function and cx data   Valrie Hart  A 06/05/2011,10:22 AM

## 2011-06-05 NOTE — Progress Notes (Signed)
06/05/11 1845 C-diff PCR results negative, notified Dr Juanetta Gosling this evening, stated okay to d/c enteric precautions.

## 2011-06-05 NOTE — Progress Notes (Signed)
Subjective: She is overall about the same. She is still somewhat short of breath. She says she's urinating too much. She has no other new complaints. She's mildly confused.  Objective: Vital signs in last 24 hours: Temp:  [97.8 F (36.6 C)-98.9 F (37.2 C)] 98.1 F (36.7 C) (05/30 0637) Pulse Rate:  [77-103] 100  (05/30 0637) Resp:  [18-24] 20  (05/30 0637) BP: (91-129)/(54-75) 129/75 mmHg (05/30 0637) SpO2:  [84 %-91 %] 90 % (05/30 0745) Weight:  [63.3 kg (139 lb 8.8 oz)] 63.3 kg (139 lb 8.8 oz) (05/30 5621) Weight change: 8.415 kg (18 lb 8.8 oz) Last BM Date: 06/04/11  Intake/Output from previous day: 05/29 0701 - 05/30 0700 In: 1035 [P.O.:560; I.V.:225; IV Piggyback:250] Out: 1350 [Urine:1350]  PHYSICAL EXAM General appearance: alert, cooperative and mild distress Resp: rhonchi bilaterally Cardio: regular rate and rhythm, S1, S2 normal, no murmur, click, rub or gallop GI: soft, non-tender; bowel sounds normal; no masses,  no organomegaly Extremities: extremities normal, atraumatic, no cyanosis or edema  Lab Results:    Basic Metabolic Panel:  Basename 06/05/11 0535 06/04/11 0526  NA 135 136  K 3.3* 2.8*  CL 103 100  CO2 23 22  GLUCOSE 108* 113*  BUN 18 16  CREATININE 1.46* 1.43*  CALCIUM 9.0 9.0  MG -- --  PHOS -- --   Liver Function Tests:  Basename 06/03/11 0742  AST 26  ALT 22  ALKPHOS 163*  BILITOT 0.3  PROT 8.0  ALBUMIN 3.7   No results found for this basename: LIPASE:2,AMYLASE:2 in the last 72 hours No results found for this basename: AMMONIA:2 in the last 72 hours CBC:  Basename 06/04/11 0526 06/03/11 0742  WBC 8.2 13.0*  NEUTROABS -- 10.7*  HGB 10.8* 11.7*  HCT 32.8* 35.9*  MCV 88.2 88.0  PLT 228 255   Cardiac Enzymes:  Basename 06/03/11 0742  CKTOTAL 102  CKMB 3.2  CKMBINDEX --  TROPONINI <0.30   BNP:  Basename 06/03/11 0742  PROBNP 8515.0*   D-Dimer:  Alvira Philips 06/03/11 0742  DDIMER 2.21*   CBG: No results found for  this basename: GLUCAP:6 in the last 72 hours Hemoglobin A1C: No results found for this basename: HGBA1C in the last 72 hours Fasting Lipid Panel: No results found for this basename: CHOL,HDL,LDLCALC,TRIG,CHOLHDL,LDLDIRECT in the last 72 hours Thyroid Function Tests: No results found for this basename: TSH,T4TOTAL,FREET4,T3FREE,THYROIDAB in the last 72 hours Anemia Panel: No results found for this basename: VITAMINB12,FOLATE,FERRITIN,TIBC,IRON,RETICCTPCT in the last 72 hours Coagulation:  Basename 06/03/11 0742  LABPROT 13.1  INR 0.97   Urine Drug Screen: Drugs of Abuse  No results found for this basename: labopia, cocainscrnur, labbenz, amphetmu, thcu, labbarb    Alcohol Level: No results found for this basename: ETH:2 in the last 72 hours Urinalysis:  Basename 06/03/11 0747  COLORURINE YELLOW  LABSPEC 1.020  PHURINE 7.0  GLUCOSEU NEGATIVE  HGBUR SMALL*  BILIRUBINUR NEGATIVE  KETONESUR NEGATIVE  PROTEINUR 100*  UROBILINOGEN 0.2  NITRITE NEGATIVE  LEUKOCYTESUR NEGATIVE   Misc. Labs:  ABGS  Basename 06/03/11 0721  PHART 7.465*  PO2ART 63.2*  TCO2 20.3  HCO3 24.2*   CULTURES Recent Results (from the past 240 hour(s))  CULTURE, BLOOD (ROUTINE X 2)     Status: Normal (Preliminary result)   Collection Time   06/03/11  7:45 AM      Component Value Range Status Comment   Specimen Description BLOOD RIGHT ARM   Final    Special Requests BOTTLES DRAWN AEROBIC AND  ANAEROBIC 5CC   Final    Culture NO GROWTH 1 DAY   Final    Report Status PENDING   Incomplete   CULTURE, BLOOD (ROUTINE X 2)     Status: Normal (Preliminary result)   Collection Time   06/03/11  7:46 AM      Component Value Range Status Comment   Specimen Description BLOOD LEFT ARM   Final    Special Requests BOTTLES DRAWN AEROBIC AND ANAEROBIC 5CC   Final    Culture NO GROWTH 1 DAY   Final    Report Status PENDING   Incomplete    Studies/Results: No results found.  Medications:  Prior to Admission:    Prescriptions prior to admission  Medication Sig Dispense Refill  . alum & mag hydroxide-simeth (MAALOX/MYLANTA) 200-200-20 MG/5ML suspension Take 10 mLs by mouth every 4 (four) hours as needed. For indigestion      . amLODipine (NORVASC) 10 MG tablet Take 10 mg by mouth daily.        . diphenoxylate-atropine (LOMOTIL) 2.5-0.025 MG per tablet Take 1 tablet by mouth 4 (four) times daily as needed.      . donepezil (ARICEPT) 10 MG tablet Take 10 mg by mouth at bedtime.        Marland Kitchen escitalopram (LEXAPRO) 10 MG tablet Take 10 mg by mouth daily.        . furosemide (LASIX) 20 MG tablet Take 20 mg by mouth 2 (two) times daily.      Marland Kitchen levETIRAcetam (KEPPRA) 250 MG tablet Take 250 mg by mouth 2 (two) times daily.        . magnesium oxide (MAG-OX) 400 MG tablet Take 400 mg by mouth 2 (two) times daily.      . memantine (NAMENDA) 5 MG tablet Take 5 mg by mouth 2 (two) times daily.      . metoprolol (LOPRESSOR) 50 MG tablet Take 50 mg by mouth.        . olmesartan (BENICAR) 40 MG tablet Take 40 mg by mouth daily.        . potassium chloride (K-DUR,KLOR-CON) 10 MEQ tablet Take 10 mEq by mouth every other day.      . spironolactone (ALDACTONE) 25 MG tablet Take 25 mg by mouth daily.        Marland Kitchen venlafaxine (EFFEXOR-XR) 37.5 MG 24 hr capsule Take 37.5 mg by mouth daily.        Marland Kitchen zolpidem (AMBIEN) 5 MG tablet Take 5 mg by mouth at bedtime as needed.       Scheduled:   . albuterol  2.5 mg Nebulization Q6H  . amLODipine  10 mg Oral Daily  . antiseptic oral rinse  15 mL Mouth Rinse BID  . docusate sodium  100 mg Oral BID  . donepezil  10 mg Oral QHS  . enoxaparin  30 mg Subcutaneous Q24H  . escitalopram  10 mg Oral Daily  . furosemide  20 mg Oral BID  . ipratropium  0.5 mg Nebulization Q6H  . irbesartan  300 mg Oral Daily  . levETIRAcetam  250 mg Oral BID  . levofloxacin (LEVAQUIN) IV  250 mg Intravenous Q24H  . memantine  5 mg Oral BID  . metoprolol  50 mg Oral BID  . piperacillin-tazobactam (ZOSYN)  IV   3.375 g Intravenous Q8H  . potassium chloride  20 mEq Oral BID  . sodium chloride      . sodium chloride      . vancomycin  500 mg Intravenous Q24H  . DISCONTD: spironolactone  25 mg Oral Daily   Continuous:   . sodium chloride 20 mL/hr at 06/05/11 0830   IRJ:[id number], acetaminophen, diphenoxylate-atropine, HYDROcodone-acetaminophen, LORazepam, ondansetron (ZOFRAN) IV, ondansetron, zolpidem  Assesment: She has multiple medical problems. She was admitted with pneumonia. She has dementia. She has some congestive heart failure. She has chronic renal failure. She's had problems with her and potassium levels. She may have some element of overactive bladder but she has been receiving IV fluids and she is on 2 diuretics. Active Problems:  * No active hospital problems. *     Plan: I decreased her IV fluids and stop Aldactone for the moment.    LOS: 2 days   Zyshonne Malecha L 06/05/2011, 8:33 AM

## 2011-06-06 LAB — CBC
Hemoglobin: 11.4 g/dL — ABNORMAL LOW (ref 12.0–15.0)
MCHC: 32.6 g/dL (ref 30.0–36.0)
Platelets: 265 10*3/uL (ref 150–400)
RBC: 3.98 MIL/uL (ref 3.87–5.11)

## 2011-06-06 LAB — DIFFERENTIAL
Basophils Relative: 1 % (ref 0–1)
Eosinophils Absolute: 0.5 10*3/uL (ref 0.0–0.7)
Lymphs Abs: 1.3 10*3/uL (ref 0.7–4.0)
Monocytes Relative: 12 % (ref 3–12)
Neutro Abs: 5 10*3/uL (ref 1.7–7.7)
Neutrophils Relative %: 65 % (ref 43–77)

## 2011-06-06 LAB — VANCOMYCIN, TROUGH: Vancomycin Tr: 9.8 ug/mL — ABNORMAL LOW (ref 10.0–20.0)

## 2011-06-06 MED ORDER — VANCOMYCIN HCL 1000 MG IV SOLR
750.0000 mg | INTRAVENOUS | Status: DC
  Administered 2011-06-07: 750 mg via INTRAVENOUS
  Filled 2011-06-06 (×3): qty 750

## 2011-06-06 MED ORDER — LEVOFLOXACIN 250 MG PO TABS
250.0000 mg | ORAL_TABLET | Freq: Every day | ORAL | Status: DC
  Administered 2011-06-06: 250 mg via ORAL
  Filled 2011-06-06: qty 1

## 2011-06-06 NOTE — Progress Notes (Signed)
Subjective: She seems about the same. She still very concerned about the fact that she's urinating frequently. She also had frequent bowel movements so I had her get a C. difficile PCR but it was negative. She seems to be breathing better. She is still somewhat agitated which I think is a combination of her dementia and being out of her normal environment  Objective: Vital signs in last 24 hours: Temp:  [98.1 F (36.7 C)-98.9 F (37.2 C)] 98.7 F (37.1 C) (05/31 0605) Pulse Rate:  [76-94] 91  (05/31 0605) Resp:  [18-20] 20  (05/31 0605) BP: (95-130)/(59-70) 130/70 mmHg (05/31 0605) SpO2:  [90 %-94 %] 94 % (05/31 0650) Weight:  [63 kg (138 lb 14.2 oz)] 63 kg (138 lb 14.2 oz) (05/31 0605) Weight change: -0.3 kg (-10.6 oz) Last BM Date: 06/05/11  Intake/Output from previous day: 05/30 0701 - 05/31 0700 In: 730 [P.O.:380; I.V.:100; IV Piggyback:250] Out: 850 [Urine:850]  PHYSICAL EXAM General appearance: alert, cooperative and Confused and somewhat agitated Resp: clear to auscultation bilaterally Cardio: irregularly irregular rhythm GI: soft, non-tender; bowel sounds normal; no masses,  no organomegaly Extremities: extremities normal, atraumatic, no cyanosis or edema  Lab Results:    Basic Metabolic Panel:  Basename 06/05/11 0535 06/04/11 0526  NA 135 136  K 3.3* 2.8*  CL 103 100  CO2 23 22  GLUCOSE 108* 113*  BUN 18 16  CREATININE 1.46* 1.43*  CALCIUM 9.0 9.0  MG -- --  PHOS -- --   Liver Function Tests: No results found for this basename: AST:2,ALT:2,ALKPHOS:2,BILITOT:2,PROT:2,ALBUMIN:2 in the last 72 hours No results found for this basename: LIPASE:2,AMYLASE:2 in the last 72 hours No results found for this basename: AMMONIA:2 in the last 72 hours CBC:  Basename 06/06/11 0627 06/04/11 0526  WBC 7.8 8.2  NEUTROABS 5.0 --  HGB 11.4* 10.8*  HCT 35.0* 32.8*  MCV 87.9 88.2  PLT 265 228   Cardiac Enzymes: No results found for this basename:  CKTOTAL:3,CKMB:3,CKMBINDEX:3,TROPONINI:3 in the last 72 hours BNP: No results found for this basename: PROBNP:3 in the last 72 hours D-Dimer: No results found for this basename: DDIMER:2 in the last 72 hours CBG: No results found for this basename: GLUCAP:6 in the last 72 hours Hemoglobin A1C: No results found for this basename: HGBA1C in the last 72 hours Fasting Lipid Panel: No results found for this basename: CHOL,HDL,LDLCALC,TRIG,CHOLHDL,LDLDIRECT in the last 72 hours Thyroid Function Tests: No results found for this basename: TSH,T4TOTAL,FREET4,T3FREE,THYROIDAB in the last 72 hours Anemia Panel: No results found for this basename: VITAMINB12,FOLATE,FERRITIN,TIBC,IRON,RETICCTPCT in the last 72 hours Coagulation: No results found for this basename: LABPROT:2,INR:2 in the last 72 hours Urine Drug Screen: Drugs of Abuse  No results found for this basename: labopia, cocainscrnur, labbenz, amphetmu, thcu, labbarb    Alcohol Level: No results found for this basename: ETH:2 in the last 72 hours Urinalysis: No results found for this basename: COLORURINE:2,APPERANCEUR:2,LABSPEC:2,PHURINE:2,GLUCOSEU:2,HGBUR:2,BILIRUBINUR:2,KETONESUR:2,PROTEINUR:2,UROBILINOGEN:2,NITRITE:2,LEUKOCYTESUR:2 in the last 72 hours Misc. Labs:  ABGS No results found for this basename: PHART,PCO2,PO2ART,TCO2,HCO3 in the last 72 hours CULTURES Recent Results (from the past 240 hour(s))  CULTURE, BLOOD (ROUTINE X 2)     Status: Normal (Preliminary result)   Collection Time   06/03/11  7:45 AM      Component Value Range Status Comment   Specimen Description BLOOD RIGHT ARM   Final    Special Requests BOTTLES DRAWN AEROBIC AND ANAEROBIC 5CC   Final    Culture NO GROWTH 3 DAYS   Final  Report Status PENDING   Incomplete   CULTURE, BLOOD (ROUTINE X 2)     Status: Normal (Preliminary result)   Collection Time   06/03/11  7:46 AM      Component Value Range Status Comment   Specimen Description BLOOD LEFT ARM    Final    Special Requests BOTTLES DRAWN AEROBIC AND ANAEROBIC 5CC   Final    Culture NO GROWTH 3 DAYS   Final    Report Status PENDING   Incomplete   CLOSTRIDIUM DIFFICILE BY PCR     Status: Normal   Collection Time   06/05/11  4:06 PM      Component Value Range Status Comment   C difficile by pcr NEGATIVE  NEGATIVE  Final    Studies/Results: No results found.  Medications:  Scheduled:   . albuterol  2.5 mg Nebulization Q6H  . amLODipine  10 mg Oral Daily  . antiseptic oral rinse  15 mL Mouth Rinse BID  . docusate sodium  100 mg Oral BID  . donepezil  10 mg Oral QHS  . enoxaparin  30 mg Subcutaneous Q24H  . escitalopram  10 mg Oral Daily  . furosemide  20 mg Oral BID  . ipratropium  0.5 mg Nebulization Q6H  . irbesartan  300 mg Oral Daily  . levETIRAcetam  250 mg Oral BID  . levofloxacin (LEVAQUIN) IV  250 mg Intravenous Q24H  . memantine  5 mg Oral BID  . metoprolol  50 mg Oral BID  . piperacillin-tazobactam (ZOSYN)  IV  3.375 g Intravenous Q8H  . potassium chloride  20 mEq Oral BID  . vancomycin  500 mg Intravenous Q24H   Continuous:   . sodium chloride 20 mL/hr at 06/05/11 0830   WUJ:[id number], acetaminophen, diphenoxylate-atropine, HYDROcodone-acetaminophen, LORazepam, ondansetron (ZOFRAN) IV, ondansetron, zolpidem  Assesment: She has pneumonia and is getting better. She has dementia which is about the same. She's been hypokalemic and that is being replaced. She has had excess urination but I think that's related to her diuretics. Principal Problem:  *Healthcare-associated pneumonia Active Problems:  COPD (chronic obstructive pulmonary disease)  Chronic systolic congestive heart failure  Peripheral arterial disease  Chronic renal failure  Dementia  Hypokalemia    Plan: No change in her medicines. She will have laboratory work in the morning and may be able to be discharged tomorrow    LOS: 3 days   Parlee Amescua L 06/06/2011, 8:43 AM

## 2011-06-06 NOTE — Progress Notes (Addendum)
ANTIBIOTIC CONSULT NOTE   Pharmacy Consult for Vancomycin, Zosyn, Levaquin Indication: pneumonia  No Known Allergies  Patient Measurements: Height: 5\' 3"  (160 cm) Weight: 138 lb 14.2 oz (63 kg) IBW/kg (Calculated) : 52.4   Vital Signs: Temp: 98.7 F (37.1 C) (05/31 0605) Temp src: Oral (05/31 0605) BP: 130/70 mmHg (05/31 0605) Pulse Rate: 91  (05/31 0605) Intake/Output from previous day: 05/30 0701 - 05/31 0700 In: 730 [P.O.:380; I.V.:100; IV Piggyback:250] Out: 850 [Urine:850] Intake/Output from this shift: Total I/O In: 300 [P.O.:300] Out: 301 [Urine:300; Stool:1]  Labs:  [facility] 06/06/11 0627 06/05/11 0535 06/04/11 0526  WBC 7.8 -- 8.2  HGB 11.4* -- 10.8*  PLT 265 -- 228  LABCREA -- -- --  CREATININE -- 1.46* 1.43*   Estimated Creatinine Clearance: 26.1 ml/min (by C-G formula based on Cr of 1.46).  Basename 06/06/11 0627  VANCOTROUGH 9.8*  VANCOPEAK --  Drue Dun --  GENTTROUGH --  GENTPEAK --  GENTRANDOM --  TOBRATROUGH --  TOBRAPEAK --  TOBRARND --  AMIKACINPEAK --  AMIKACINTROU --  AMIKACIN --     Microbiology: Recent Results (from the past 720 hour(s))  CLOSTRIDIUM DIFFICILE BY PCR     Status: Normal   Collection Time   05/08/11  6:28 AM      Component Value Range Status Comment   C difficile by pcr NEGATIVE  NEGATIVE  Final   STOOL CULTURE     Status: Normal   Collection Time   05/08/11  6:28 AM      Component Value Range Status Comment   Specimen Description STOOL   Final    Special Requests NONE   Final    Culture     Final    Value: NO SALMONELLA, SHIGELLA, CAMPYLOBACTER, OR YERSINIA ISOLATED   Report Status 05/12/2011 FINAL   Final   CULTURE, BLOOD (ROUTINE X 2)     Status: Normal (Preliminary result)   Collection Time   06/03/11  7:45 AM      Component Value Range Status Comment   Specimen Description BLOOD RIGHT ARM   Final    Special Requests BOTTLES DRAWN AEROBIC AND ANAEROBIC 5CC   Final    Culture NO GROWTH 3 DAYS   Final    Report Status PENDING   Incomplete   CULTURE, BLOOD (ROUTINE X 2)     Status: Normal (Preliminary result)   Collection Time   06/03/11  7:46 AM      Component Value Range Status Comment   Specimen Description BLOOD LEFT ARM   Final    Special Requests BOTTLES DRAWN AEROBIC AND ANAEROBIC 5CC   Final    Culture NO GROWTH 3 DAYS   Final    Report Status PENDING   Incomplete   CLOSTRIDIUM DIFFICILE BY PCR     Status: Normal   Collection Time   06/05/11  4:06 PM      Component Value Range Status Comment   C difficile by pcr NEGATIVE  NEGATIVE  Final    Medical History: Past Medical History  Diagnosis Date  . COPD (chronic obstructive pulmonary disease)   . CHF (congestive heart failure)   . Delirium   . Metabolic alkalosis   . Hypertension   . Diabetes mellitus   . Anxiety   . Depression   . Panic attacks   . Chronic diarrhea of unknown origin    Medications:  Scheduled:     . albuterol  2.5 mg Nebulization Q6H  . amLODipine  10 mg Oral Daily  . antiseptic oral rinse  15 mL Mouth Rinse BID  . docusate sodium  100 mg Oral BID  . donepezil  10 mg Oral QHS  . enoxaparin  30 mg Subcutaneous Q24H  . escitalopram  10 mg Oral Daily  . furosemide  20 mg Oral BID  . ipratropium  0.5 mg Nebulization Q6H  . irbesartan  300 mg Oral Daily  . levETIRAcetam  250 mg Oral BID  . levofloxacin (LEVAQUIN) IV  250 mg Intravenous Q24H  . memantine  5 mg Oral BID  . metoprolol  50 mg Oral BID  . piperacillin-tazobactam (ZOSYN)  IV  3.375 g Intravenous Q8H  . potassium chloride  20 mEq Oral BID  . vancomycin  750 mg Intravenous Q24H  . DISCONTD: vancomycin  500 mg Intravenous Q24H   Assessment: 78 yo F admitted from NH for HCAP. She is currently on day#4 Vancomcyin, Zosyn, Levaquin.  Vancomycin trough level was below goal range this morning. Increase in Scr since admission. Cx data negative to date.   Goal of Therapy:  Vancomycin trough level 15-20 mcg/ml  Plan:  1) Continue Zosyn  3.375gm IV Q8h to be infused over 4hrs 2) Change Levaquin 250mg  po Q24h since she meets criteria per P&T policy 3) Increase Vancomycin 750mg  IV q24h 4) Weekly Vancomycin trough while on medication 5) Monitor renal function and cx data   Elson Clan 06/06/2011,11:21 AM

## 2011-06-06 NOTE — Clinical Social Work Note (Signed)
Spoke w son, Tequita Marrs, informed him that anticipated discharge date is tomorrow.  Son willing to transport to [place].  [employer] knows she is set to come on Saturday.  RN CM notified.   Clovis Cao Clinical Social Worker [phone])

## 2011-06-06 NOTE — Progress Notes (Signed)
Oxygen off assist pt to the commode had lg bm then assist pt to chair set up for lunch checked oxygen sats ORA after 30 mins sats 91% ORA

## 2011-06-06 NOTE — Progress Notes (Signed)
Physical Therapy Treatment Patient Details Name: Melissa [name] MRN: [id number] DOB: 10-25-1925 Today's Date: 06/06/2011 Time: 4098-1191 PT Time Calculation (min): 39 min  PT Assessment / Plan / Recommendation Comments on Treatment Session  Pt still somewhat anxious.  Functionally is doing well...able to ambulate 150' with walker and no instability.  O2 sat on RA with gait =94%.    Follow Up Recommendations       Barriers to Discharge        Equipment Recommendations       Recommendations for Other Services    Frequency     Plan      Precautions / Restrictions     Pertinent Vitals/Pain     Mobility  Transfers Sit to Stand: 6: Modified independent (Device/Increase time) Stand to Sit: 6: Modified independent (Device/Increase time) Stand Pivot Transfers: 6: Modified independent (Device/Increase time) Ambulation/Gait Ambulation/Gait Assistance: 6: Modified independent (Device/Increase time) Ambulation Distance (Feet): 150 Feet Assistive device: Rolling walker Gait Pattern: Trunk flexed    Exercises     PT Diagnosis:    PT Problem List:   PT Treatment Interventions:     PT Goals Acute Rehab PT Goals PT Goal: Ambulate - Progress: Progressing toward goal  Visit Information  Last PT Received On: 06/06/11    Subjective Data  Subjective: feeling better   Cognition       Balance     End of Session PT - End of Session Equipment Utilized During Treatment: Gait belt Activity Tolerance: Patient tolerated treatment well Patient left: in bed;with call bell/phone within reach;with bed alarm set Nurse Communication: Mobility status    Konrad Penta 06/06/2011, 1:42 PM

## 2011-06-07 LAB — BASIC METABOLIC PANEL
BUN: 17 mg/dL (ref 6–23)
Chloride: 103 mEq/L (ref 96–112)
GFR calc Af Amer: 34 mL/min — ABNORMAL LOW (ref >90)
GFR calc non Af Amer: 29 mL/min — ABNORMAL LOW (ref >90)
Potassium: 4 mEq/L (ref 3.5–5.1)
Sodium: 135 mEq/L (ref 135–145)

## 2011-06-07 MED ORDER — ALBUTEROL SULFATE HFA 108 (90 BASE) MCG/ACT IN AERS: 2.0000 | INHALATION_SPRAY | Freq: Four times a day (QID) | RESPIRATORY_TRACT | Status: DC | PRN

## 2011-06-07 MED ORDER — AMOXICILLIN-POT CLAVULANATE 875-125 MG PO TABS: 1.0000 | ORAL_TABLET | Freq: Two times a day (BID) | ORAL | Status: AC

## 2011-06-07 NOTE — Progress Notes (Signed)
Weekend Clinical Social Worker (CSW) informed that pt is ready for dc back to [facility]. CSW contacted Marchelle Folks [phone] from [facility] and confirmed that pt is okay for dc, and only needs the FL2. CSW prepared dc packet to include FL2, dc summary & AVS. CSW also submitted AVS and DC summary via [facility]. CSW visited pt room and spoke with pt and pt daughter in law Zella Ball who were both aware of pt dc. Zella Ball will provide transportation for pt. CSW provided Robin with dc packet, pt did not have any questions for CSW. CSW signing off.  Theresia Bough, MSW, Theresia Majors [phone]

## 2011-06-07 NOTE — Discharge Summary (Signed)
Physician Discharge Summary  Patient ID: Melissa [name] MRN: [id number] DOB/AGE: 04/23/1925 78 y.o. Primary Care Physician:ROBSON,MICHAEL [name], MD, MD Admit date: 06/03/2011 Discharge date: 06/07/2011    Discharge Diagnoses:   Principal Problem:  *Healthcare-associated pneumonia Active Problems:  COPD (chronic obstructive pulmonary disease)  Chronic systolic congestive heart failure  Peripheral arterial disease  Chronic renal failure  Dementia  Hypokalemia   Medication List  As of 06/07/2011 10:21 AM   TAKE these medications         albuterol 108 (90 BASE) MCG/ACT inhaler   Commonly known as: PROVENTIL HFA;VENTOLIN HFA   Inhale 2 puffs into the lungs every 6 (six) hours as needed for wheezing.      alum & mag hydroxide-simeth 200-200-20 MG/5ML suspension   Commonly known as: MAALOX/MYLANTA   Take 10 mLs by mouth every 4 (four) hours as needed. For indigestion      amLODipine 10 MG tablet   Commonly known as: NORVASC   Take 10 mg by mouth daily.      amoxicillin-clavulanate 875-125 MG per tablet   Commonly known as: AUGMENTIN   Take 1 tablet by mouth 2 (two) times daily.      diphenoxylate-atropine 2.5-0.025 MG per tablet   Commonly known as: LOMOTIL   Take 1 tablet by mouth 4 (four) times daily as needed.      donepezil 10 MG tablet   Commonly known as: ARICEPT   Take 10 mg by mouth at bedtime.      escitalopram 10 MG tablet   Commonly known as: LEXAPRO   Take 10 mg by mouth daily.      furosemide 20 MG tablet   Commonly known as: LASIX   Take 20 mg by mouth 2 (two) times daily.      levETIRAcetam 250 MG tablet   Commonly known as: KEPPRA   Take 250 mg by mouth 2 (two) times daily.      magnesium oxide 400 MG tablet   Commonly known as: MAG-OX   Take 400 mg by mouth 2 (two) times daily.      memantine 5 MG tablet   Commonly known as: NAMENDA   Take 5 mg by mouth 2 (two) times daily.      metoprolol 50 MG tablet   Commonly known as: LOPRESSOR   Take  50 mg by mouth.      olmesartan 40 MG tablet   Commonly known as: BENICAR   Take 40 mg by mouth daily.      potassium chloride 10 MEQ tablet   Commonly known as: K-DUR,KLOR-CON   Take 10 mEq by mouth every other day.      spironolactone 25 MG tablet   Commonly known as: ALDACTONE   Take 25 mg by mouth daily.      venlafaxine XR 37.5 MG 24 hr capsule   Commonly known as: EFFEXOR-XR   Take 37.5 mg by mouth daily.      zolpidem 5 MG tablet   Commonly known as: AMBIEN   Take 5 mg by mouth at bedtime as needed.            Discharged Condition: Improved    Consults: None  Significant Diagnostic Studies: Dg Chest 2 View  06/03/2011  *RADIOLOGY REPORT*  Clinical Data: Shortness of breath, chest pressure, cough and fever.  History of COPD and CHF.  CHEST - 2 VIEW  Comparison: 01/21/2010  Findings: New, asymmetric airspace opacity primarily in the right middle lobe but also  likely with extension into the upper and lower lobes is suspicious for acute pneumonia.  Superimposed on this is probable mild interstitial edema with small bilateral pleural effusions.  The heart size is stable.  IMPRESSION: New right-sided pulmonary airspace disease suspicious for acute pneumonia.  Mild superimposed interstitial edema.  Original Report Authenticated By: Reola Calkins, M.D.   US Renal  05/15/2011  *RADIOLOGY REPORT*  Clinical Data:  Renal failure, history diabetes, hypertension, CHF, COPD  RENAL/URINARY TRACT ULTRASOUND COMPLETE  Comparison:  None Correlation:  MRI lumbar spine 06/22/2009  Findings:  Right Kidney:  9.1 cm length.  Normal cortical thickness. Increased cortical echogenicity.  No mass, hydronephrosis, or shadowing calcification.  No perinephric fluid.  Left Kidney:  9.8 cm length.  Cortical thinning.  Increased cortical echogenicity.  No gross mass, hydronephrosis or shadowing calcification.  Left kidney is less well visualized due to its increased echogenicity, similar to that of  perinephric fat.  Bladder:  Decompressed, inadequately evaluated  Other findings:  Abdominal aortic aneurysm at mid abdominal aorta, 4.1 x 4.5 cm in greatest axial dimensions extending 4.7 cm length. An additional distal focal area of aneurysmal dilatation is seen 2.7 x 2.9 cm just above the bifurcation.  IMPRESSION: Medical renal disease changes of both kidneys. Abdominal aortic aneurysm 4.1 x 4.5 cm in size extending 4.7 cm length, with additional focal dilatation of the distal aorta 2.9 x 2.7 cm greatest diameter just above the aortic bifurcation. Abdominal aortic aneurysm has increased in size since the prior MRI of 06/22/2009 when it measured 3.5 cm greatest diameter.  Findings called to Dr. Juanetta Gosling on 05/15/2011 at 0952 hours.  Original Report Authenticated By: Lollie Marrow, M.D.    Lab Results: Basic Metabolic Panel:  Basename 06/07/11 0615 06/05/11 0535  NA 135 135  K 4.0 3.3*  CL 103 103  CO2 24 23  GLUCOSE 99 108*  BUN 17 18  CREATININE 1.58* 1.46*  CALCIUM 9.4 9.0  MG -- --  PHOS -- --   Liver Function Tests: No results found for this basename: AST:2,ALT:2,ALKPHOS:2,BILITOT:2,PROT:2,ALBUMIN:2 in the last 72 hours   CBC:  Basename 06/06/11 0627  WBC 7.8  NEUTROABS 5.0  HGB 11.4*  HCT 35.0*  MCV 87.9  PLT 265    Recent Results (from the past 240 hour(s))  CULTURE, BLOOD (ROUTINE X 2)     Status: Normal (Preliminary result)   Collection Time   06/03/11  7:45 AM      Component Value Range Status Comment   Specimen Description BLOOD RIGHT ARM   Final    Special Requests BOTTLES DRAWN AEROBIC AND ANAEROBIC 5CC   Final    Culture NO GROWTH 3 DAYS   Final    Report Status PENDING   Incomplete   CULTURE, BLOOD (ROUTINE X 2)     Status: Normal (Preliminary result)   Collection Time   06/03/11  7:46 AM      Component Value Range Status Comment   Specimen Description BLOOD LEFT ARM   Final    Special Requests BOTTLES DRAWN AEROBIC AND ANAEROBIC 5CC   Final    Culture NO  GROWTH 3 DAYS   Final    Report Status PENDING   Incomplete   CLOSTRIDIUM DIFFICILE BY PCR     Status: Normal   Collection Time   06/05/11  4:06 PM      Component Value Range Status Comment   C difficile by pcr NEGATIVE  NEGATIVE  Final  Hospital Course: She was admitted with pneumonia. She was started on IV antibiotics. She also has multiple other medical problems including dementia and she was quite agitated during most of her hospitalization I believe from her dementia. She continued to improve over the next several days to the point that she was ready for discharge and was discharged back to her assisted living facility. She had complaints of increased urination but I think that was related to her diuretics. She was mildly hypokalemic and that was replaced.  Discharge Exam: Blood pressure 116/64, pulse 80, temperature 98.6 F (37 C), temperature source Oral, resp. rate 18, height 5\' 3"  (1.6 m), weight 62 kg (136 lb 11 oz), SpO2 97.00%. She is awake and alert. Her chest is much clearer. Her heart is regular.  Disposition: Discharge to her assisted living facility  Discharge Orders    Future Orders Please Complete By Expires   Discharge patient           Signed: Fredirick Maudlin Pager [phone]  06/07/2011, 10:21 AM

## 2011-06-07 NOTE — Progress Notes (Signed)
Patient discharged  to [facility] in stable condition

## 2011-06-07 NOTE — Progress Notes (Signed)
Subjective: She feels better and has no new complaints. She is anxious. She is able to ambulate well. She does not qualify for oxygen.  Objective: Vital signs in last 24 hours: Temp:  [98.3 F (36.8 C)-98.6 F (37 C)] 98.6 F (37 C) (06/01 0504) Pulse Rate:  [80-95] 80  (06/01 0504) Resp:  [18] 18  (06/01 0504) BP: (93-123)/(53-66) 116/64 mmHg (06/01 0504) SpO2:  [87 %-97 %] 97 % (06/01 0704) Weight:  [62 kg (136 lb 11 oz)] 62 kg (136 lb 11 oz) (06/01 0504) Weight change: -1 kg (-2 lb 3.3 oz) Last BM Date: 06/06/11  Intake/Output from previous day: 05/31 0701 - 06/01 0700 In: 790 [P.O.:790] Out: 1952 [Urine:1950; Stool:2]  PHYSICAL EXAM General appearance: alert, cooperative and mild distress Resp: rhonchi bilaterally Cardio: irregularly irregular rhythm GI: soft, non-tender; bowel sounds normal; no masses,  no organomegaly Extremities: extremities normal, atraumatic, no cyanosis or edema  Lab Results:    Basic Metabolic Panel:  Basename 06/07/11 0615 06/05/11 0535  NA 135 135  K 4.0 3.3*  CL 103 103  CO2 24 23  GLUCOSE 99 108*  BUN 17 18  CREATININE 1.58* 1.46*  CALCIUM 9.4 9.0  MG -- --  PHOS -- --   Liver Function Tests: No results found for this basename: AST:2,ALT:2,ALKPHOS:2,BILITOT:2,PROT:2,ALBUMIN:2 in the last 72 hours No results found for this basename: LIPASE:2,AMYLASE:2 in the last 72 hours No results found for this basename: AMMONIA:2 in the last 72 hours CBC:  Basename 06/06/11 0627  WBC 7.8  NEUTROABS 5.0  HGB 11.4*  HCT 35.0*  MCV 87.9  PLT 265   Cardiac Enzymes: No results found for this basename: CKTOTAL:3,CKMB:3,CKMBINDEX:3,TROPONINI:3 in the last 72 hours BNP: No results found for this basename: PROBNP:3 in the last 72 hours D-Dimer: No results found for this basename: DDIMER:2 in the last 72 hours CBG: No results found for this basename: GLUCAP:6 in the last 72 hours Hemoglobin A1C: No results found for this basename: HGBA1C  in the last 72 hours Fasting Lipid Panel: No results found for this basename: CHOL,HDL,LDLCALC,TRIG,CHOLHDL,LDLDIRECT in the last 72 hours Thyroid Function Tests: No results found for this basename: TSH,T4TOTAL,FREET4,T3FREE,THYROIDAB in the last 72 hours Anemia Panel: No results found for this basename: VITAMINB12,FOLATE,FERRITIN,TIBC,IRON,RETICCTPCT in the last 72 hours Coagulation: No results found for this basename: LABPROT:2,INR:2 in the last 72 hours Urine Drug Screen: Drugs of Abuse  No results found for this basename: labopia, cocainscrnur, labbenz, amphetmu, thcu, labbarb    Alcohol Level: No results found for this basename: ETH:2 in the last 72 hours Urinalysis: No results found for this basename: COLORURINE:2,APPERANCEUR:2,LABSPEC:2,PHURINE:2,GLUCOSEU:2,HGBUR:2,BILIRUBINUR:2,KETONESUR:2,PROTEINUR:2,UROBILINOGEN:2,NITRITE:2,LEUKOCYTESUR:2 in the last 72 hours Misc. Labs:  ABGS No results found for this basename: PHART,PCO2,PO2ART,TCO2,HCO3 in the last 72 hours CULTURES Recent Results (from the past 240 hour(s))  CULTURE, BLOOD (ROUTINE X 2)     Status: Normal (Preliminary result)   Collection Time   06/03/11  7:45 AM      Component Value Range Status Comment   Specimen Description BLOOD RIGHT ARM   Final    Special Requests BOTTLES DRAWN AEROBIC AND ANAEROBIC 5CC   Final    Culture NO GROWTH 3 DAYS   Final    Report Status PENDING   Incomplete   CULTURE, BLOOD (ROUTINE X 2)     Status: Normal (Preliminary result)   Collection Time   06/03/11  7:46 AM      Component Value Range Status Comment   Specimen Description BLOOD LEFT ARM   Final  Special Requests BOTTLES DRAWN AEROBIC AND ANAEROBIC 5CC   Final    Culture NO GROWTH 3 DAYS   Final    Report Status PENDING   Incomplete   CLOSTRIDIUM DIFFICILE BY PCR     Status: Normal   Collection Time   06/05/11  4:06 PM      Component Value Range Status Comment   C difficile by pcr NEGATIVE  NEGATIVE  Final     Studies/Results: No results found.  Medications:  Prior to Admission:  Prescriptions prior to admission  Medication Sig Dispense Refill  . alum & mag hydroxide-simeth (MAALOX/MYLANTA) 200-200-20 MG/5ML suspension Take 10 mLs by mouth every 4 (four) hours as needed. For indigestion      . amLODipine (NORVASC) 10 MG tablet Take 10 mg by mouth daily.        . diphenoxylate-atropine (LOMOTIL) 2.5-0.025 MG per tablet Take 1 tablet by mouth 4 (four) times daily as needed.      . donepezil (ARICEPT) 10 MG tablet Take 10 mg by mouth at bedtime.        Marland Kitchen escitalopram (LEXAPRO) 10 MG tablet Take 10 mg by mouth daily.        . furosemide (LASIX) 20 MG tablet Take 20 mg by mouth 2 (two) times daily.      Marland Kitchen levETIRAcetam (KEPPRA) 250 MG tablet Take 250 mg by mouth 2 (two) times daily.        . magnesium oxide (MAG-OX) 400 MG tablet Take 400 mg by mouth 2 (two) times daily.      . memantine (NAMENDA) 5 MG tablet Take 5 mg by mouth 2 (two) times daily.      . metoprolol (LOPRESSOR) 50 MG tablet Take 50 mg by mouth.        . olmesartan (BENICAR) 40 MG tablet Take 40 mg by mouth daily.        . potassium chloride (K-DUR,KLOR-CON) 10 MEQ tablet Take 10 mEq by mouth every other day.      . spironolactone (ALDACTONE) 25 MG tablet Take 25 mg by mouth daily.        Marland Kitchen venlafaxine (EFFEXOR-XR) 37.5 MG 24 hr capsule Take 37.5 mg by mouth daily.        Marland Kitchen zolpidem (AMBIEN) 5 MG tablet Take 5 mg by mouth at bedtime as needed.       Scheduled:   . albuterol  2.5 mg Nebulization Q6H  . amLODipine  10 mg Oral Daily  . antiseptic oral rinse  15 mL Mouth Rinse BID  . docusate sodium  100 mg Oral BID  . donepezil  10 mg Oral QHS  . enoxaparin  30 mg Subcutaneous Q24H  . escitalopram  10 mg Oral Daily  . furosemide  20 mg Oral BID  . ipratropium  0.5 mg Nebulization Q6H  . irbesartan  300 mg Oral Daily  . levETIRAcetam  250 mg Oral BID  . levofloxacin  250 mg Oral Q1200  . memantine  5 mg Oral BID  .  metoprolol  50 mg Oral BID  . piperacillin-tazobactam (ZOSYN)  IV  3.375 g Intravenous Q8H  . potassium chloride  20 mEq Oral BID  . vancomycin  750 mg Intravenous Q24H  . DISCONTD: levofloxacin (LEVAQUIN) IV  250 mg Intravenous Q24H  . DISCONTD: vancomycin  500 mg Intravenous Q24H   Continuous:   . sodium chloride 20 mL/hr at 06/05/11 0830   ZOX:[id number], acetaminophen, diphenoxylate-atropine, HYDROcodone-acetaminophen, LORazepam, ondansetron (ZOFRAN) IV, ondansetron, zolpidem  Assesment: She was admitted with pneumonia. She has multiple other medical problems including dementia and COPD. She does have chronic systolic congestive heart failure which is been well-controlled during her hospitalization. I believe she is at maximum hospital benefit now. Principal Problem:  *Healthcare-associated pneumonia Active Problems:  COPD (chronic obstructive pulmonary disease)  Chronic systolic congestive heart failure  Peripheral arterial disease  Chronic renal failure  Dementia  Hypokalemia    Plan: She will transfer back to her assisted living facility    LOS: 4 days   Mekai Wilkinson L 06/07/2011, 10:07 AM

## 2011-06-08 LAB — CULTURE, BLOOD (ROUTINE X 2)

## 2011-06-15 ENCOUNTER — Emergency Department (HOSPITAL_COMMUNITY): Payer: Medicare Other

## 2011-06-15 ENCOUNTER — Encounter (HOSPITAL_COMMUNITY): Payer: Self-pay | Admitting: Emergency Medicine

## 2011-06-15 ENCOUNTER — Inpatient Hospital Stay (HOSPITAL_COMMUNITY)
Admission: EM | Admit: 2011-06-15 | Discharge: 2011-06-20 | DRG: 292 | Disposition: A | Discharge status: Skilled Nursing Facility | Payer: Medicare Other | Attending: Pulmonary Disease | Admitting: Pulmonary Disease

## 2011-06-15 DIAGNOSIS — F341 Dysthymic disorder: Secondary | ICD-10-CM | POA: Diagnosis present

## 2011-06-15 DIAGNOSIS — E871 Hypo-osmolality and hyponatremia: Secondary | ICD-10-CM | POA: Diagnosis present

## 2011-06-15 DIAGNOSIS — Z8701 Personal history of pneumonia (recurrent): Secondary | ICD-10-CM

## 2011-06-15 DIAGNOSIS — J4489 Other specified chronic obstructive pulmonary disease: Secondary | ICD-10-CM | POA: Diagnosis present

## 2011-06-15 DIAGNOSIS — D649 Anemia, unspecified: Secondary | ICD-10-CM | POA: Diagnosis present

## 2011-06-15 DIAGNOSIS — I5023 Acute on chronic systolic (congestive) heart failure: Principal | ICD-10-CM | POA: Diagnosis present

## 2011-06-15 DIAGNOSIS — I059 Rheumatic mitral valve disease, unspecified: Secondary | ICD-10-CM | POA: Diagnosis present

## 2011-06-15 DIAGNOSIS — I2510 Atherosclerotic heart disease of native coronary artery without angina pectoris: Secondary | ICD-10-CM | POA: Diagnosis present

## 2011-06-15 DIAGNOSIS — Z87891 Personal history of nicotine dependence: Secondary | ICD-10-CM

## 2011-06-15 DIAGNOSIS — Z79899 Other long term (current) drug therapy: Secondary | ICD-10-CM

## 2011-06-15 DIAGNOSIS — N189 Chronic kidney disease, unspecified: Secondary | ICD-10-CM | POA: Diagnosis present

## 2011-06-15 DIAGNOSIS — I739 Peripheral vascular disease, unspecified: Secondary | ICD-10-CM | POA: Diagnosis present

## 2011-06-15 DIAGNOSIS — I509 Heart failure, unspecified: Secondary | ICD-10-CM | POA: Diagnosis present

## 2011-06-15 DIAGNOSIS — Z91013 Allergy to seafood: Secondary | ICD-10-CM

## 2011-06-15 DIAGNOSIS — I129 Hypertensive chronic kidney disease with stage 1 through stage 4 chronic kidney disease, or unspecified chronic kidney disease: Secondary | ICD-10-CM | POA: Diagnosis present

## 2011-06-15 DIAGNOSIS — J449 Chronic obstructive pulmonary disease, unspecified: Secondary | ICD-10-CM | POA: Diagnosis present

## 2011-06-15 DIAGNOSIS — G40909 Epilepsy, unspecified, not intractable, without status epilepticus: Secondary | ICD-10-CM | POA: Diagnosis present

## 2011-06-15 DIAGNOSIS — E876 Hypokalemia: Secondary | ICD-10-CM | POA: Diagnosis present

## 2011-06-15 DIAGNOSIS — F0390 Unspecified dementia without behavioral disturbance: Secondary | ICD-10-CM | POA: Diagnosis present

## 2011-06-15 DIAGNOSIS — I5022 Chronic systolic (congestive) heart failure: Secondary | ICD-10-CM

## 2011-06-15 DIAGNOSIS — I2589 Other forms of chronic ischemic heart disease: Secondary | ICD-10-CM | POA: Diagnosis present

## 2011-06-15 LAB — BLOOD GAS, ARTERIAL
Acid-Base Excess: 2 mmol/L (ref 0.0–2.0)
Delivery systems: POSITIVE
FIO2: 1 %
Inspiratory PAP: 10
O2 Saturation: 99.3 %
TCO2: 22.1 mmol/L (ref 0–100)

## 2011-06-15 LAB — DIFFERENTIAL
Basophils Relative: 1 % (ref 0–1)
Lymphocytes Relative: 8 % — ABNORMAL LOW (ref 12–46)
Lymphs Abs: 0.9 10*3/uL (ref 0.7–4.0)
Monocytes Absolute: 0.6 10*3/uL (ref 0.1–1.0)
Monocytes Relative: 5 % (ref 3–12)
Neutro Abs: 9.4 10*3/uL — ABNORMAL HIGH (ref 1.7–7.7)
Neutrophils Relative %: 86 % — ABNORMAL HIGH (ref 43–77)

## 2011-06-15 LAB — CBC
HCT: 31.7 % — ABNORMAL LOW (ref 36.0–46.0)
Hemoglobin: 10.4 g/dL — ABNORMAL LOW (ref 12.0–15.0)
MCH: 28.4 pg (ref 26.0–34.0)
MCHC: 32.8 g/dL (ref 30.0–36.0)
MCV: 86.6 fL (ref 78.0–100.0)
Platelets: 280 10*3/uL (ref 150–400)
RBC: 3.66 MIL/uL — ABNORMAL LOW (ref 3.87–5.11)
RDW: 13.8 % (ref 11.5–15.5)
WBC: 11 10*3/uL — ABNORMAL HIGH (ref 4.0–10.5)

## 2011-06-15 LAB — BASIC METABOLIC PANEL
BUN: 21 mg/dL (ref 6–23)
CO2: 21 mEq/L (ref 19–32)
Chloride: 94 mEq/L — ABNORMAL LOW (ref 96–112)
Creatinine, Ser: 1.36 mg/dL — ABNORMAL HIGH (ref 0.50–1.10)
GFR calc Af Amer: 40 mL/min — ABNORMAL LOW (ref >90)
Glucose, Bld: 124 mg/dL — ABNORMAL HIGH (ref 70–99)
Potassium: 3.4 mEq/L — ABNORMAL LOW (ref 3.5–5.1)

## 2011-06-15 LAB — TROPONIN I: Troponin I: <0.3 ng/mL

## 2011-06-15 MED ORDER — FUROSEMIDE 10 MG/ML IJ SOLN
40.0000 mg | Freq: Three times a day (TID) | INTRAMUSCULAR | Status: DC
  Administered 2011-06-15 – 2011-06-18 (×8): 40 mg via INTRAVENOUS
  Filled 2011-06-15 (×9): qty 4

## 2011-06-15 MED ORDER — SODIUM CHLORIDE 0.9 % IJ SOLN
3.0000 mL | INTRAMUSCULAR | Status: DC | PRN
  Filled 2011-06-15: qty 3

## 2011-06-15 MED ORDER — ALBUTEROL SULFATE (5 MG/ML) 0.5% IN NEBU
INHALATION_SOLUTION | RESPIRATORY_TRACT | Status: AC
  Filled 2011-06-15: qty 0.5

## 2011-06-15 MED ORDER — ONDANSETRON HCL 4 MG PO TABS: 4.0000 mg | ORAL_TABLET | Freq: Four times a day (QID) | ORAL | Status: DC | PRN

## 2011-06-15 MED ORDER — ALBUTEROL SULFATE (5 MG/ML) 0.5% IN NEBU: 2.5000 mg | INHALATION_SOLUTION | Freq: Four times a day (QID) | RESPIRATORY_TRACT | Status: DC | PRN

## 2011-06-15 MED ORDER — SODIUM CHLORIDE 0.9 % IJ SOLN
INTRAMUSCULAR | Status: AC
  Filled 2011-06-15: qty 3

## 2011-06-15 MED ORDER — METOPROLOL TARTRATE 50 MG PO TABS
50.0000 mg | ORAL_TABLET | Freq: Two times a day (BID) | ORAL | Status: DC
  Administered 2011-06-15 – 2011-06-16 (×3): 50 mg via ORAL
  Filled 2011-06-15 (×3): qty 1

## 2011-06-15 MED ORDER — FUROSEMIDE 10 MG/ML IJ SOLN
40.0000 mg | Freq: Once | INTRAMUSCULAR | Status: AC
  Administered 2011-06-15: 40 mg via INTRAVENOUS
  Filled 2011-06-15: qty 4

## 2011-06-15 MED ORDER — AMLODIPINE BESYLATE 5 MG PO TABS
10.0000 mg | ORAL_TABLET | Freq: Every day | ORAL | Status: DC
  Administered 2011-06-16: 10 mg via ORAL
  Filled 2011-06-15: qty 2

## 2011-06-15 MED ORDER — ALBUTEROL SULFATE HFA 108 (90 BASE) MCG/ACT IN AERS
2.0000 | INHALATION_SPRAY | Freq: Four times a day (QID) | RESPIRATORY_TRACT | Status: DC | PRN
  Filled 2011-06-15: qty 6.7

## 2011-06-15 MED ORDER — ESCITALOPRAM OXALATE 10 MG PO TABS
10.0000 mg | ORAL_TABLET | Freq: Every day | ORAL | Status: DC
  Administered 2011-06-16 – 2011-06-20 (×5): 10 mg via ORAL
  Filled 2011-06-15 (×7): qty 1

## 2011-06-15 MED ORDER — LORAZEPAM 0.5 MG PO TABS
0.5000 mg | ORAL_TABLET | Freq: Two times a day (BID) | ORAL | Status: DC | PRN
  Administered 2011-06-15 (×2): 0.5 mg via ORAL
  Filled 2011-06-15 (×2): qty 1

## 2011-06-15 MED ORDER — ACETAMINOPHEN 650 MG RE SUPP: 650.0000 mg | Freq: Four times a day (QID) | RECTAL | Status: DC | PRN

## 2011-06-15 MED ORDER — IRBESARTAN 150 MG PO TABS
150.0000 mg | ORAL_TABLET | Freq: Every day | ORAL | Status: DC
  Administered 2011-06-16 – 2011-06-20 (×5): 150 mg via ORAL
  Filled 2011-06-15 (×7): qty 1

## 2011-06-15 MED ORDER — POTASSIUM CHLORIDE CRYS ER 20 MEQ PO TBCR
40.0000 meq | EXTENDED_RELEASE_TABLET | Freq: Three times a day (TID) | ORAL | Status: AC
  Administered 2011-06-16 – 2011-06-17 (×4): 40 meq via ORAL
  Filled 2011-06-15 (×4): qty 2
  Filled 2011-06-15: qty 1
  Filled 2011-06-15 (×2): qty 2

## 2011-06-15 MED ORDER — ENOXAPARIN SODIUM 30 MG/0.3ML ~~LOC~~ SOLN
30.0000 mg | Freq: Every day | SUBCUTANEOUS | Status: DC
  Administered 2011-06-15 – 2011-06-20 (×6): 30 mg via SUBCUTANEOUS
  Filled 2011-06-15 (×6): qty 0.3

## 2011-06-15 MED ORDER — ONDANSETRON HCL 4 MG/2ML IJ SOLN: 4.0000 mg | Freq: Four times a day (QID) | INTRAMUSCULAR | Status: DC | PRN

## 2011-06-15 MED ORDER — VENLAFAXINE HCL ER 37.5 MG PO CP24
37.5000 mg | ORAL_CAPSULE | Freq: Every day | ORAL | Status: DC
  Administered 2011-06-16 – 2011-06-20 (×5): 37.5 mg via ORAL
  Filled 2011-06-15 (×8): qty 1

## 2011-06-15 MED ORDER — AMOXICILLIN-POT CLAVULANATE 875-125 MG PO TABS
1.0000 | ORAL_TABLET | Freq: Two times a day (BID) | ORAL | Status: DC
  Administered 2011-06-15 – 2011-06-20 (×10): 1 via ORAL
  Filled 2011-06-15 (×13): qty 1

## 2011-06-15 MED ORDER — ACETAMINOPHEN 325 MG PO TABS
650.0000 mg | ORAL_TABLET | Freq: Four times a day (QID) | ORAL | Status: DC | PRN
  Administered 2011-06-17: 650 mg via ORAL
  Filled 2011-06-15: qty 2

## 2011-06-15 MED ORDER — ALUM & MAG HYDROXIDE-SIMETH 200-200-20 MG/5ML PO SUSP: 10.0000 mL | ORAL | Status: DC | PRN

## 2011-06-15 MED ORDER — LEVETIRACETAM 500 MG PO TABS
250.0000 mg | ORAL_TABLET | Freq: Two times a day (BID) | ORAL | Status: DC
  Administered 2011-06-15 – 2011-06-20 (×10): 250 mg via ORAL
  Filled 2011-06-15 (×12): qty 1
  Filled 2011-06-15: qty 2

## 2011-06-15 MED ORDER — SODIUM CHLORIDE 0.9 % IJ SOLN
3.0000 mL | Freq: Two times a day (BID) | INTRAMUSCULAR | Status: DC
  Administered 2011-06-15 – 2011-06-17 (×4): 3 mL via INTRAVENOUS
  Filled 2011-06-15 (×3): qty 3

## 2011-06-15 MED ORDER — ALUM & MAG HYDROXIDE-SIMETH 200-200-20 MG/5ML PO SUSP: 30.0000 mL | Freq: Four times a day (QID) | ORAL | Status: DC | PRN

## 2011-06-15 MED ORDER — SODIUM CHLORIDE 0.9 % IV SOLN
250.0000 mL | INTRAVENOUS | Status: DC | PRN
  Administered 2011-06-15: 250 mL via INTRAVENOUS

## 2011-06-15 MED ORDER — MEMANTINE HCL 10 MG PO TABS
10.0000 mg | ORAL_TABLET | Freq: Two times a day (BID) | ORAL | Status: DC
  Administered 2011-06-15 – 2011-06-20 (×10): 10 mg via ORAL
  Filled 2011-06-15 (×13): qty 1

## 2011-06-15 NOTE — ED Notes (Signed)
Pt c/o sob that started yesterday. Worse today. Iv en route. 02 en route. Was 80% ra with EMS. Upper 90s at 5L. No resp distress noted. Took alb inhaler last night with no relief.  Alert/oriented to most. Denies pain. Slight difficulty finishing sentences due to sob. Very little accessory muscle use noted. C/o yellow productive cough x 3 days.

## 2011-06-15 NOTE — Progress Notes (Signed)
Paged Dr. Ouida Sills.  Pt received prn Ativan 0.5 mg but states that she "feels like she could get up and run around the room...she feels jittery."

## 2011-06-15 NOTE — ED Notes (Signed)
Dr Ouida Sills at bedside.

## 2011-06-15 NOTE — ED Provider Notes (Signed)
History   This chart was scribed for Donnetta Hutching, MD by Toya Smothers. The patient was seen in room APA07/APA07. Patient's care was started at [phone].  CSN: [id number]  Arrival date & time 06/15/11  1191   First MD Initiated Contact with Patient 06/15/11 [phone]     Chief Complaint  Patient presents with  . Shortness of Breath   Patient is a 78 y.o. female presenting with shortness of breath. The history is provided by the patient. No language interpreter was used.  Shortness of Breath     Melissa [name] is a 78 y.o. female who presents to the Emergency Department complaining of sudden onset moderate severe constant SOB. denies chest pain. Pt states that she was admitted to the [facility] for pneumonia last Tuesday and has been having constant SOB. Pt also reports that she walks with the assistance of a walker and has not walked lately.level V caveat for urgent need for intervention.  Pulse ox initially in the low 80% range  Pt list PCP as Dr. Juanetta Gosling  Past Medical History  Diagnosis Date  . COPD (chronic obstructive pulmonary disease)   . CHF (congestive heart failure)   . Delirium   . Metabolic alkalosis   . Hypertension   . Diabetes mellitus   . Anxiety   . Depression   . Panic attacks   . Chronic diarrhea of unknown origin     Past Surgical History  Procedure Date  . Inguinal hernia repair     right    History reviewed. No pertinent family history.  History  Substance Use Topics  . Smoking status: Former Games developer  . Smokeless tobacco: Not on file  . Alcohol Use: No   Review of Systems  Unable to perform ROS: Dementia    Allergies  Review of patient's allergies indicates no known allergies.  Home Medications   Current Outpatient Rx  Name Route Sig Dispense Refill  . ALBUTEROL SULFATE HFA 108 (90 BASE) MCG/ACT IN AERS Inhalation Inhale 2 puffs into the lungs every 6 (six) hours as needed for wheezing. 1 Inhaler 2  . AMLODIPINE BESYLATE 10 MG PO TABS Oral  Take 10 mg by mouth daily.      . AMOXICILLIN-POT CLAVULANATE 875-125 MG PO TABS Oral Take 1 tablet by mouth 2 (two) times daily. 10 tablet 1  . ESCITALOPRAM OXALATE 10 MG PO TABS Oral Take 10 mg by mouth daily.      . FUROSEMIDE 20 MG PO TABS Oral Take 20 mg by mouth 2 (two) times daily.    Marland Kitchen LEVETIRACETAM 250 MG PO TABS Oral Take 250 mg by mouth 2 (two) times daily.      Marland Kitchen LORAZEPAM 0.5 MG PO TABS Oral Take 0.5 mg by mouth 2 (two) times daily as needed. For anxiety    . MAGNESIUM OXIDE 400 MG PO TABS Oral Take 400 mg by mouth 2 (two) times daily.    Marland Kitchen MEMANTINE HCL 10 MG PO TABS Oral Take 10 mg by mouth 2 (two) times daily.    Marland Kitchen METOPROLOL TARTRATE 50 MG PO TABS Oral Take 50 mg by mouth 2 (two) times daily.     . VENLAFAXINE HCL ER 37.5 MG PO CP24 Oral Take 37.5 mg by mouth daily.      Marland Kitchen ZOLPIDEM TARTRATE 5 MG PO TABS Oral Take 5 mg by mouth at bedtime as needed. For sleep    . ALUM & MAG HYDROXIDE-SIMETH 200-200-20 MG/5ML PO SUSP Oral Take  10 mLs by mouth every 4 (four) hours as needed. For indigestion    . DIPHENOXYLATE-ATROPINE 2.5-0.025 MG PO TABS Oral Take 1 tablet by mouth 4 (four) times daily as needed. For diarrhea    . DONEPEZIL HCL 10 MG PO TABS Oral Take 10 mg by mouth at bedtime.      Marland Kitchen OLMESARTAN MEDOXOMIL 40 MG PO TABS Oral Take 40 mg by mouth daily.      Marland Kitchen POTASSIUM CHLORIDE CRYS ER 10 MEQ PO TBCR Oral Take 10 mEq by mouth every other day.      BP 134/98  Pulse 89  Temp(Src) 98.6 F (37 C) (Oral)  Resp 22  SpO2 90%  Physical Exam  Nursing note and vitals reviewed. Constitutional: She is oriented to person, place, and time. She appears well-developed and well-nourished.       Slightly dehydrated.  HENT:  Head: Normocephalic and atraumatic.  Eyes: Conjunctivae and EOM are normal. Pupils are equal, round, and reactive to light.  Neck: Normal range of motion. Neck supple.  Cardiovascular: Normal rate and regular rhythm.   Pulmonary/Chest: Effort normal. She has wheezes.   Abdominal: Soft. Bowel sounds are normal.  Musculoskeletal: Normal range of motion.  Neurological: She is alert and oriented to person, place, and time.  Skin: Skin is warm and dry.       No peripheral erythema.  Psychiatric: She has a normal mood and affect.       Slight dementia.    ED Course  Procedures (including critical care time) DIAGNOSTIC STUDIES: Oxygen Saturation is 90% on [place], low by my interpretation.    COORDINATION OF CARE: 10:07AM- Evaluated state of Pt's present illness. Ordered chest x-ray and breathing treatment.     Labs Reviewed  CBC - Abnormal; Notable for the following:    WBC 11.0 (*)    RBC 3.66 (*)    Hemoglobin 10.4 (*)    HCT 31.7 (*)    All other components within normal limits  DIFFERENTIAL - Abnormal; Notable for the following:    Neutrophils Relative 86 (*)    Neutro Abs 9.4 (*)    Lymphocytes Relative 8 (*)    All other components within normal limits  BASIC METABOLIC PANEL - Abnormal; Notable for the following:    Sodium 129 (*)    Potassium 3.4 (*)    Chloride 94 (*)    Glucose, Bld 124 (*)    Creatinine, Ser 1.36 (*)    GFR calc non Af Amer 35 (*)    GFR calc Af Amer 40 (*)    All other components within normal limits  PRO B NATRIURETIC PEPTIDE - Abnormal; Notable for the following:    Pro B Natriuretic peptide (BNP) 19603.0 (*)    All other components within normal limits  TROPONIN I   Dg Chest Portable 1 View  06/15/2011  *RADIOLOGY REPORT*  Clinical Data: Shortness of breath.  PORTABLE CHEST - 1 VIEW  Comparison: 06/03/2011 the  Findings: Cardiomegaly.  Bilateral lower lobe pulmonary opacities with effusion are consistent with congestive heart failure.  Acute worsening since the previous most recent film.  Calcified tortuous aorta.  No pneumothorax.  Skeletal osteopenia.  IMPRESSION: Cardiomegaly with bilateral lower lobe opacities and bilateral effusions consistent with congestive heart failure.  Original Report Authenticated By:  Elsie Stain, M.D.    No diagnosis found.   Date: 06/15/2011  Rate:88  Rhythm: normal sinus rhythm  QRS Axis: normal  Intervals: QT prolonged  ST/T  Wave abnormalities: normal  Conduction Disutrbances:none  Narrative Interpretation:   Old EKG Reviewed: changes noted      MDM  Patient is hypoxemic. Chest x-ray suggests congestive heart failure. iv Lasix. Admit.    I personally performed the services described in this documentation, which was scribed in my presence. The recorded information has been reviewed and considered.        Donnetta Hutching, MD 06/15/11 1357

## 2011-06-16 ENCOUNTER — Encounter (HOSPITAL_COMMUNITY): Payer: Self-pay | Admitting: Adult Health

## 2011-06-16 DIAGNOSIS — I059 Rheumatic mitral valve disease, unspecified: Secondary | ICD-10-CM

## 2011-06-16 DIAGNOSIS — I509 Heart failure, unspecified: Secondary | ICD-10-CM

## 2011-06-16 DIAGNOSIS — N189 Chronic kidney disease, unspecified: Secondary | ICD-10-CM

## 2011-06-16 DIAGNOSIS — I5023 Acute on chronic systolic (congestive) heart failure: Principal | ICD-10-CM

## 2011-06-16 LAB — CBC
HCT: 33.4 % — ABNORMAL LOW (ref 36.0–46.0)
Hemoglobin: 10.6 g/dL — ABNORMAL LOW (ref 12.0–15.0)
MCHC: 31.7 g/dL (ref 30.0–36.0)
MCV: 88.1 fL (ref 78.0–100.0)
RDW: 13.9 % (ref 11.5–15.5)

## 2011-06-16 LAB — BASIC METABOLIC PANEL
GFR calc Af Amer: 38 mL/min — ABNORMAL LOW (ref >90)
GFR calc non Af Amer: 33 mL/min — ABNORMAL LOW (ref >90)
Potassium: 3.4 mEq/L — ABNORMAL LOW (ref 3.5–5.1)
Sodium: 137 mEq/L (ref 135–145)

## 2011-06-16 LAB — DIFFERENTIAL
Basophils Absolute: 0.1 10*3/uL (ref 0.0–0.1)
Eosinophils Relative: 1 % (ref 0–5)
Lymphocytes Relative: 11 % — ABNORMAL LOW (ref 12–46)
Monocytes Absolute: 0.7 10*3/uL (ref 0.1–1.0)
Monocytes Relative: 9 % (ref 3–12)

## 2011-06-16 MED ORDER — SPIRONOLACTONE 25 MG PO TABS
25.0000 mg | ORAL_TABLET | Freq: Every day | ORAL | Status: DC
  Administered 2011-06-17 – 2011-06-20 (×4): 25 mg via ORAL
  Filled 2011-06-16 (×4): qty 1

## 2011-06-16 MED ORDER — AMLODIPINE BESYLATE 5 MG PO TABS
5.0000 mg | ORAL_TABLET | Freq: Every day | ORAL | Status: DC
  Administered 2011-06-17 – 2011-06-18 (×2): 5 mg via ORAL
  Filled 2011-06-16 (×2): qty 1

## 2011-06-16 MED ORDER — NYSTATIN 100000 UNIT/GM EX POWD
Freq: Three times a day (TID) | CUTANEOUS | Status: DC
  Administered 2011-06-16 – 2011-06-20 (×12): via TOPICAL
  Filled 2011-06-16: qty 15

## 2011-06-16 MED ORDER — LORAZEPAM 0.5 MG PO TABS
0.5000 mg | ORAL_TABLET | ORAL | Status: DC | PRN
  Administered 2011-06-16 – 2011-06-19 (×7): 0.5 mg via ORAL
  Filled 2011-06-16 (×7): qty 1
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
## 2011-06-16 NOTE — H&P (Signed)
[name], [name]                ACCOUNT NO.:  [id number]  MEDICAL RECORD NO.:  [id number]  LOCATION:  IC02                          FACILITY:  [facility]  PHYSICIAN:  Melissa [name]. Melissa Sills, MD       DATE OF BIRTH:  Mar 18, 1925  DATE OF ADMISSION:  06/15/2011 DATE OF DISCHARGE:  LH                             HISTORY & PHYSICAL   CHIEF COMPLAINT:  Shortness of breath.  HISTORY OF PRESENT ILLNESS:  This patient is an 78 year old patient of Dr. Juanetta [name], who presented to the emergency room after developing increasing shortness of breath over the last several days.  She has had orthopnea.  She denies leg swelling or weight gain.  She has been too dyspneic to sleep the night before admission.  She has increased dyspnea on exertion.  She is a resident of [address]  She has recently been discharged from the hospital last week after treatment of pneumonia.  She remains on antibiotic therapy in the form of Augmentin. On evaluation in the emergency room, she was found to be in congestive heart failure.  She does have a history of congestive heart failure and COPD.  She was treated with IV Lasix and is diuresed and is felt less short of breath.  PAST MEDICAL HISTORY: 1. CHF. 2. COPD. 3. Dementia. 4. Hypertension. 5. Depression and anxiety. 6. Status post right inguinal hernia repair. 7. Seizure disorder.  MEDICATIONS: 1. Lasix 20 mg b.i.d. 2. Albuterol p.r.n. 3. Amlodipine 10 mg daily. 4. Augmentin 875 b.i.d. 5. Lexapro 10 mg daily. 6. Keppra 250 mg b.i.d. 7. Lorazepam 0.5 mg b.i.d. p.r.n. 8. Magnesium oxide 400 mg b.i.d. 9. Namenda 10 mg b.i.d. 10.Lopressor 50 mg b.i.d. 11.Effexor XR 37.5 mg daily. 12.Ambien 5 mg at bedtime p.r.n. 13.Lomotil p.r.n. 14.Mylanta p.r.n. 15.Aricept 10 mg daily as listed on her medication sheet, but she     tells me she has stopped taking it because of nightmares. 16.Benicar 40 mg daily. 17.Potassium 10 mEq every other day.  ALLERGIES:  SHELL  FISH.  SOCIAL HISTORY:  She is a former smoker.  She does not use alcohol.  FAMILY HISTORY:  Her mother died of an aneurysm in the brain, her father died of a stroke.  REVIEW OF SYSTEMS:  No chest pain, syncope, vomiting, fever, or difficulty voiding.  PHYSICAL EXAM:  VITAL SIGNS: Temperature 97.5, pulse 76, respirations 16, blood pressure 118/63. GENERAL: Alert and dyspneic appearing. HEENT: Eyes appear normal.  Nose and oropharynx are unremarkable. NECK: Supple with no JVD or thyromegaly. LUNGS: Bibasilar rales. HEART: Regular with no murmurs or gallops. ABDOMEN: Soft and nontender with no hepatosplenomegaly. EXTREMITIES: Reveal no cyanosis, clubbing, or edema.  No calf tenderness or swelling. NEURO:  No focal weakness. LYMPH NODES: No cervical or supraclavicular enlargement. SKIN: Warm and dry.  LABORATORY DATA:  White count 11.0, hemoglobin 10.4, platelets 280,000, sodium 129, potassium 3.4, bicarb 21, BUN 21, creatinine 1.36, calcium 9.1, troponin less than 0.30.  BNP 19,603, and glucose 93.  His chest x- ray is consistent with CHF.  IMPRESSION/PLAN: 1. Congestive heart failure.  She will require admission and treatment     with IV Lasix.  She is  already diuresing well in the emergency     room. 2. Recent pneumonia and COPD, continue Augmentin and p.r.n. albuterol. 3. Hypokalemia, will replace orally. 4. Hyponatremia. 5. Renal insufficiency, stable. 6. Anemia. 7. Dementia, will continue Namenda and hold Aricept. 8. Depression and anxiety.     Melissa [name]. Melissa Sills, MD     ROF/MEDQ  D:  06/16/2011  T:  06/16/2011  Job:  [id number]

## 2011-06-16 NOTE — Progress Notes (Addendum)
At ~1910 went into patients room with Shon Hale, RN to get report. Noted patient to be very restless, moving constantly, and stating that she could not breath and was anxious. Repositioned patient. O2 saturation taken was 84% on 50% venturi mask. 100%NRB applied and RT called to bedside stat. Oxygen saturation only up to 90%. BiPap applied per RT with saturations in the high 90's. Dr. Ouida Sills notified at [phone]. Orders given to continue BiPap, transfer patient to ICU, and obtain ABGs. MD did not want to increase anxiety or diuretic medication at this time. Patients son, Aujanae Mccullum, notified of transfer at 1940 with no questions. Thanked nurse for the update. Patient transferred to ICU at approximately 2020 by RN, NT, and RT and noted to be much less restless. Report given to Liborio Nixon, [place].

## 2011-06-16 NOTE — Progress Notes (Signed)
Subjective: She was admitted yesterday with CHF. She got worse as the day went on and then apparently transferred to the intensive care unit and started on BiPAP. This morning she looks pretty comfortable. Her oxygenation has been excellent On BiPAP. She denies any chest pain.  Objective: Vital signs in last 24 hours: Temp:  [97.5 F (36.4 C)-98.6 F (37 C)] 98.5 F (36.9 C) (06/10 0430) Pulse Rate:  [76-106] 82  (06/10 0600) Resp:  [16-28] 23  (06/10 0600) BP: (118-153)/(63-98) 121/64 mmHg (06/10 0600) SpO2:  [82 %-100 %] 97 % (06/10 0657) FiO2 (%):  [50 %-100 %] 100 % (06/09 1929) Weight:  [65.3 kg (143 lb 15.4 oz)] 65.3 kg (143 lb 15.4 oz) (06/10 0430) Weight change:  Last BM Date: 06/15/11  Intake/Output from previous day: 06/09 0701 - 06/10 0700 In: 321.3 [P.O.:240; I.V.:73.3; IV Piggyback:8] Out: 3050 [Urine:3050]  PHYSICAL EXAM General appearance: alert, mild distress and Confused Resp: rales bibasilar Cardio: regular rate and rhythm, S1, S2 normal, no murmur, click, rub or gallop GI: soft, non-tender; bowel sounds normal; no masses,  no organomegaly Extremities: extremities normal, atraumatic, no cyanosis or edema  Lab Results:    Basic Metabolic Panel:  Basename 06/16/11 0532 06/15/11 1008  NA 137 129*  K 3.4* 3.4*  CL 100 94*  CO2 26 21  GLUCOSE 93 124*  BUN 19 21  CREATININE 1.44* 1.36*  CALCIUM 9.2 9.1  MG -- --  PHOS -- --   Liver Function Tests: No results found for this basename: AST:2,ALT:2,ALKPHOS:2,BILITOT:2,PROT:2,ALBUMIN:2 in the last 72 hours No results found for this basename: LIPASE:2,AMYLASE:2 in the last 72 hours No results found for this basename: AMMONIA:2 in the last 72 hours CBC:  Basename 06/15/11 1008  WBC 11.0*  NEUTROABS 9.4*  HGB 10.4*  HCT 31.7*  MCV 86.6  PLT 280   Cardiac Enzymes:  Basename 06/15/11 1000  CKTOTAL --  CKMB --  CKMBINDEX --  TROPONINI <0.30   BNP:  Basename 06/15/11 1008  PROBNP 19603.0*    D-Dimer: No results found for this basename: DDIMER:2 in the last 72 hours CBG:  Basename 06/15/11 1359  GLUCAP 101*   Hemoglobin A1C: No results found for this basename: HGBA1C in the last 72 hours Fasting Lipid Panel: No results found for this basename: CHOL,HDL,LDLCALC,TRIG,CHOLHDL,LDLDIRECT in the last 72 hours Thyroid Function Tests: No results found for this basename: TSH,T4TOTAL,FREET4,T3FREE,THYROIDAB in the last 72 hours Anemia Panel: No results found for this basename: VITAMINB12,FOLATE,FERRITIN,TIBC,IRON,RETICCTPCT in the last 72 hours Coagulation: No results found for this basename: LABPROT:2,INR:2 in the last 72 hours Urine Drug Screen: Drugs of Abuse  No results found for this basename: labopia, cocainscrnur, labbenz, amphetmu, thcu, labbarb    Alcohol Level: No results found for this basename: ETH:2 in the last 72 hours Urinalysis: No results found for this basename: COLORURINE:2,APPERANCEUR:2,LABSPEC:2,PHURINE:2,GLUCOSEU:2,HGBUR:2,BILIRUBINUR:2,KETONESUR:2,PROTEINUR:2,UROBILINOGEN:2,NITRITE:2,LEUKOCYTESUR:2 in the last 72 hours Misc. Labs:  ABGS  Basename 06/15/11 1955  PHART 7.501*  PO2ART 157.0*  TCO2 22.1  HCO3 25.0*   CULTURES Recent Results (from the past 240 hour(s))  MRSA PCR SCREENING     Status: Normal   Collection Time   06/16/11  4:00 AM      Component Value Range Status Comment   MRSA by PCR NEGATIVE  NEGATIVE  Final    Studies/Results: Dg Chest Portable 1 View  06/15/2011  *RADIOLOGY REPORT*  Clinical Data: Shortness of breath.  PORTABLE CHEST - 1 VIEW  Comparison: 06/03/2011 the  Findings: Cardiomegaly.  Bilateral lower lobe pulmonary  opacities with effusion are consistent with congestive heart failure.  Acute worsening since the previous most recent film.  Calcified tortuous aorta.  No pneumothorax.  Skeletal osteopenia.  IMPRESSION: Cardiomegaly with bilateral lower lobe opacities and bilateral effusions consistent with congestive heart  failure.  Original Report Authenticated By: Elsie Stain, M.D.    Medications:  Prior to Admission:  Prescriptions prior to admission  Medication Sig Dispense Refill  . albuterol (PROVENTIL HFA;VENTOLIN HFA) 108 (90 BASE) MCG/ACT inhaler Inhale 2 puffs into the lungs every 6 (six) hours as needed for wheezing.  1 Inhaler  2  . amLODipine (NORVASC) 10 MG tablet Take 10 mg by mouth daily.        Marland Kitchen amoxicillin-clavulanate (AUGMENTIN) 875-125 MG per tablet Take 1 tablet by mouth 2 (two) times daily.  10 tablet  1  . escitalopram (LEXAPRO) 10 MG tablet Take 10 mg by mouth daily.        . furosemide (LASIX) 20 MG tablet Take 20 mg by mouth 2 (two) times daily.      Marland Kitchen levETIRAcetam (KEPPRA) 250 MG tablet Take 250 mg by mouth 2 (two) times daily.        Marland Kitchen LORazepam (ATIVAN) 0.5 MG tablet Take 0.5 mg by mouth 2 (two) times daily as needed. For anxiety      . magnesium oxide (MAG-OX) 400 MG tablet Take 400 mg by mouth 2 (two) times daily.      . memantine (NAMENDA) 10 MG tablet Take 10 mg by mouth 2 (two) times daily.      . metoprolol (LOPRESSOR) 50 MG tablet Take 50 mg by mouth 2 (two) times daily.       Marland Kitchen venlafaxine (EFFEXOR-XR) 37.5 MG 24 hr capsule Take 37.5 mg by mouth daily.        Marland Kitchen zolpidem (AMBIEN) 5 MG tablet Take 5 mg by mouth at bedtime as needed. For sleep      . alum & mag hydroxide-simeth (MAALOX/MYLANTA) 200-200-20 MG/5ML suspension Take 10 mLs by mouth every 4 (four) hours as needed. For indigestion      . diphenoxylate-atropine (LOMOTIL) 2.5-0.025 MG per tablet Take 1 tablet by mouth 4 (four) times daily as needed. For diarrhea      . donepezil (ARICEPT) 10 MG tablet Take 10 mg by mouth at bedtime.        Marland Kitchen olmesartan (BENICAR) 40 MG tablet Take 40 mg by mouth daily.        . potassium chloride (K-DUR,KLOR-CON) 10 MEQ tablet Take 10 mEq by mouth every other day.       Scheduled:   . albuterol      . amLODipine  10 mg Oral Daily  . amoxicillin-clavulanate  1 tablet Oral BID    . enoxaparin  30 mg Subcutaneous Daily  . escitalopram  10 mg Oral Daily  . furosemide  40 mg Intravenous Once  . furosemide  40 mg Intravenous Q8H  . irbesartan  150 mg Oral Daily  . levETIRAcetam  250 mg Oral BID  . memantine  10 mg Oral BID  . metoprolol  50 mg Oral BID  . potassium chloride  40 mEq Oral TID  . sodium chloride  3 mL Intravenous Q12H  . sodium chloride      . venlafaxine XR  37.5 mg Oral Daily   Continuous:  ZOX:WRUEAV chloride, acetaminophen, acetaminophen, albuterol, albuterol, alum & mag hydroxide-simeth, LORazepam, ondansetron (ZOFRAN) IV, ondansetron, sodium chloride, DISCONTD: alum & mag hydroxide-simeth  Assesment:  She has CHF. She's been treated for that is improving. She was recently in the hospital with pneumonia. She has multiple other medical problems including Active Problems:  * No active hospital problems. *     Plan: Continue current treatments. I'm going to see if she can be off BiPAP and use nasal oxygen.    LOS: 1 day   Derika Eckles L 06/16/2011, 7:45 AM

## 2011-06-16 NOTE — Progress Notes (Signed)
UR Chart Review Completed  

## 2011-06-16 NOTE — Progress Notes (Signed)
*  PRELIMINARY RESULTS* Echocardiogram 2D Echocardiogram has been performed.  Melissa [name] 06/16/2011, 1:14 PM

## 2011-06-16 NOTE — Consult Note (Signed)
CARDIOLOGY CONSULT NOTE  Patient ID: Melissa [name] MRN: [id number] DOB/AGE: [birth year] 78 y.o.  Admit date: 06/15/2011 Referring Physician: Juanetta Gosling Primary Jacelyn Grip, MD, MD Primary Cardiologist: Bernadene Bell Reason for Consultation: CHF Principal Problem:  *Acute on chronic systolic congestive heart failure Active Problems:  COPD (chronic obstructive pulmonary disease)  Peripheral arterial disease  Chronic renal failure  Dementia  HPI: Melissa [name] is a 78 y/o woman well known to our practice with history of hypertension, COPD, carotid artery disease, PAD, CHF, and mitral valve prolapse.  She was recently discharged from [facility] 2 weeks ago with admission for pneumonia. She was sent to [facility] on discharge. She began to have increased work of breathing a few days after admission there. She was readmitted with increasing shortness of breath, diagnosed with CHF with pro-BNP of 19,603. She was found to be hyponatremic at 129, hypokalemic 3.4. Chest X-ray demonstrated bilateral effusions consistent with CHF. She has been started on IV lasix, and antibiotics. She began to have restlessness and hypoxia shortly after admisson, and was transferred to ICU. She has diuresed approx 2000 cc since admssion. Review of records has her dry wt of 136 lbs. She is currently at 143 lbs. Currently she is describing weakness and feeling of choking at times. Denies frank chest pain or significant dyspnea presently.   Other history includes:  Most recent carotid Doppler ultrasound in February 07, 2009, with   high-grade stenosis in the internal carotid artery on the right at 60 to   79% with stenosis and the left ICA at 40 to 59%.  .Abnormal ABIs with an ABI completed in August 2010 revealing 0.65 on   the right and 0.44 on the left knee.   AAA with a size of 3.5 x 3.3 at its longest diameter with some   noncalcified left upper lung pulmonary nodule at 5 mm in 2011.  Review of systems complete  and found to be negative unless listed above   Past Medical History  Diagnosis Date  . COPD (chronic obstructive pulmonary disease)   . CHF (congestive heart failure)   . Delirium   . Metabolic alkalosis   . Hypertension   . Diabetes mellitus   . Anxiety   . Depression   . Panic attacks   . Chronic diarrhea of unknown origin     Family History; Patient does not remember on questioning. History   Social History  . Marital Status: Widowed    Spouse Name: N/A    Number of Children: N/A  . Years of Education: N/A   Occupational History  . Not on file.   Social History Main Topics  . Smoking status: Former Games developer  . Smokeless tobacco: Not on file  . Alcohol Use: No  . Drug Use: No  . Sexually Active: Not Currently   Other Topics Concern  . Not on file   Social History Narrative   Lives at [facility], prior to that she lived alone.     Past Surgical History  Procedure Date  . Inguinal hernia repair     right     Prescriptions prior to admission  Medication Sig Dispense Refill  . albuterol (PROVENTIL HFA;VENTOLIN HFA) 108 (90 BASE) MCG/ACT inhaler Inhale 2 puffs into the lungs every 6 (six) hours as needed for wheezing.  1 Inhaler  2  . amLODipine (NORVASC) 10 MG tablet Take 10 mg by mouth daily.        Marland Kitchen amoxicillin-clavulanate (AUGMENTIN) 875-125 MG per tablet Take  1 tablet by mouth 2 (two) times daily.  10 tablet  1  . escitalopram (LEXAPRO) 10 MG tablet Take 10 mg by mouth daily.        . furosemide (LASIX) 20 MG tablet Take 20 mg by mouth 2 (two) times daily.      Marland Kitchen levETIRAcetam (KEPPRA) 250 MG tablet Take 250 mg by mouth 2 (two) times daily.        Marland Kitchen LORazepam (ATIVAN) 0.5 MG tablet Take 0.5 mg by mouth 2 (two) times daily as needed. For anxiety      . magnesium oxide (MAG-OX) 400 MG tablet Take 400 mg by mouth 2 (two) times daily.      . memantine (NAMENDA) 10 MG tablet Take 10 mg by mouth 2 (two) times daily.      . metoprolol (LOPRESSOR) 50 MG tablet  Take 50 mg by mouth 2 (two) times daily.       Marland Kitchen venlafaxine (EFFEXOR-XR) 37.5 MG 24 hr capsule Take 37.5 mg by mouth daily.        Marland Kitchen zolpidem (AMBIEN) 5 MG tablet Take 5 mg by mouth at bedtime as needed. For sleep      . alum & mag hydroxide-simeth (MAALOX/MYLANTA) 200-200-20 MG/5ML suspension Take 10 mLs by mouth every 4 (four) hours as needed. For indigestion      . diphenoxylate-atropine (LOMOTIL) 2.5-0.025 MG per tablet Take 1 tablet by mouth 4 (four) times daily as needed. For diarrhea      . donepezil (ARICEPT) 10 MG tablet Take 10 mg by mouth at bedtime.        Marland Kitchen olmesartan (BENICAR) 40 MG tablet Take 40 mg by mouth daily.        . potassium chloride (K-DUR,KLOR-CON) 10 MEQ tablet Take 10 mEq by mouth every other day.        Physical Exam: Blood pressure 150/61, pulse 102, temperature 98.6 F (37 C), temperature source Oral, resp. rate 26, height 5\' 3"  (1.6 m), weight 143 lb 15.4 oz (65.3 kg), SpO2 90.00%.  weight has increased 25 pounds since 04/2011 General: Well developed, well nourished, in no acute distress, frail. Head: Eyes PERRLA, No xanthomas.   Normal cephalic and atramatic  Lungs: Diminished in the bases, poor inspiratory effort.Some mild inspiratory crackles. Heart: HRRR S1 S2, without MRG.  Pulses are 2+ & equal.  S3 present           Bilateral carotid bruit. No JVD.  No abdominal bruits. No femoral bruits. Abdomen: Bowel sounds are positive, abdomen soft and non-tender without masses or                  Hernia's noted. Msk:  Back normal, normal gait. Normal strength and tone for age. Extremities: No clubbing, cyanosis or edema.diminhsed Neuro: Alert and oriented X 3. Psych:  Good affect, responds appropriately  Total I&O since admission: -0.5 L   Echocardiogram: 01/22/2010 Left ventricle: The cavity size was normal. Wall thickness was increased in a pattern of mild to moderate LVH with disproportionate septal thickening. Systolic function was mildly to moderately  reduced. The estimated ejection fraction was in the range of 40% to 45%. Akinesis of the inferolateral myocardium; consistent with infarction. Mild to moderate hypokinesis of the inferior wall. - Aortic valve: Mildly calcified annulus. - Mitral valve: Moderate regurgitation. - Left atrium: The atrium was moderately dilated. - Right atrium: The atrium was mildly to moderately dilated. - Atrial septum: No defect or patent foramen ovale was identified. -  Tricuspid valve: Moderate regurgitation. - Pulmonary arteries: Systolic pressure was mildly increased. PA peak pressure: 52mm Hg (S). - Pericardium, extracardiac: There was a moderate-sized left pleural Effusion.  Repeat echocardiogram on 6/10: No significant change    Labs:   Lab Results  Component Value Date   WBC 11.0* 06/15/2011   HGB 10.4* 06/15/2011   HCT 31.7* 06/15/2011   MCV 86.6 06/15/2011   PLT 280 06/15/2011     Lab 06/16/11 0532  NA 137  K 3.4*  CL 100  CO2 26  BUN 19  CREATININE 1.44*  CALCIUM 9.2  PROT --  BILITOT --  ALKPHOS --  ALT --  AST --  GLUCOSE 93   Lab Results  Component Value Date   CKTOTAL 102 06/03/2011   CKMB 3.2 06/03/2011   TROPONINI <0.30 06/15/2011        Radiology: Dg Chest Portable 1 View  06/15/2011  *RADIOLOGY REPORT*  Clinical Data: Shortness of breath.  PORTABLE CHEST - 1 VIEW  Comparison: 06/03/2011 the  Findings: Cardiomegaly.  Bilateral lower lobe pulmonary opacities with effusion are consistent with congestive heart failure.  Acute worsening since the previous most recent film.  Calcified tortuous aorta.  No pneumothorax.  Skeletal osteopenia.  IMPRESSION: Cardiomegaly with bilateral lower lobe opacities and bilateral effusions consistent with congestive heart failure.  Original Report Authenticated By: Elsie Stain, M.D.   EKG: Normal sinus rhythm Nonspecific ST abnormality   ASSESSMENT AND PLAN:   1. Acute on Chronic CHF: She had gained approximately 10 lbs since discharge on  06/07/2011 for pneumonia. She is diuresing on lasix 40 mg every 8 hours, blood pressure is stable and moderately elevated. Will repeat echo for LV fx in the setting of recurrent CHF. Continue metoprolol 50 mg BID for heart rate control. May need to manipulate medications if echo demonstrates worsening Lv fx. Hypokalemic. Consider adding low dose spironolactone. Creatinine 1.44.  2. COPD: Recent admission for pneumonia within the last 2 weeks. She continues on antibiotics and breathing treatments.  3. History of AAA: No follow up CT scan has been completed since 2011. Can consider repeating this, but at present, no symptoms to suggest dissection.   4. Anemia: Hbg avg 10.8-11.4 over the last month.   Bettey Mare. Lyman Bishop NP [facility] 06/16/2011, 9:19 AM  Cardiology Attending Patient interviewed and examined. Discussed with Joni Reining, NP.  Above note annotated and modified based upon my findings.  Patient presents with progressive weight gain and bilateral pleural effusions. She has a known ischemic cardiomyopathy with fairly well preserved left ventricular systolic function.  There is some impairment of renal function, which may be contributing to fluid retention. She will require diuresis of at least 15 pounds in hospital, but is dramatically improved after initial dosing with furosemide.  Appropriate ARB and beta blocker therapy is already in progress. Spironolactone will be added to her diuretic regime.  Super[place] Bing, MD 06/16/2011, 5:36 PM

## 2011-06-16 NOTE — Care Management Note (Signed)
    Page 1 of 1   06/20/2011     2:39:15 PM   CARE MANAGEMENT NOTE 06/20/2011  Patient:  BELKIS, NORBECK   Account Number:  [id number]  Date Initiated:  06/16/2011  Documentation initiated by:  Sharrie Rothman  Subjective/Objective Assessment:   Pt admitted from [facility] with CHF. Pt will return to [facility] at discharge.     Action/Plan:   CSw is aware of pts admission and will arrange discharge to facility when pt is medically stable. CM will continue to monitor for any DME needs at discharge.   Anticipated DC Date:  06/20/2011   Anticipated DC Plan:  ASSISTED LIVING / REST HOME  In-house referral  Clinical Social Worker      DC Planning Services  CM consult      Choice offered to / List presented to:             Status of service:  Completed, signed off Medicare Important Message given?   (If response is "NO", the following Medicare IM given date fields will be blank) Date Medicare IM given:   Date Additional Medicare IM given:    Discharge Disposition:  ASSISTED LIVING  Per UR Regulation:    If discussed at Long Length of Stay Meetings, dates discussed:    Comments:  06/20/11 1437 Arlyss Queen, RN BSN CM Pt discharged back to [facility] today. No HH or DME needs noted. CSW will arrange discharge to facility.  06/16/11 1509 Arlyss Queen, RN BSN CM

## 2011-06-17 LAB — BASIC METABOLIC PANEL
BUN: 23 mg/dL (ref 6–23)
CO2: 25 mEq/L (ref 19–32)
Calcium: 9.5 mg/dL (ref 8.4–10.5)
Creatinine, Ser: 1.52 mg/dL — ABNORMAL HIGH (ref 0.50–1.10)

## 2011-06-17 MED ORDER — CARVEDILOL 12.5 MG PO TABS
12.5000 mg | ORAL_TABLET | Freq: Two times a day (BID) | ORAL | Status: DC
  Administered 2011-06-17 – 2011-06-20 (×5): 12.5 mg via ORAL
  Filled 2011-06-17 (×5): qty 1

## 2011-06-17 NOTE — Clinical Social Work Note (Signed)
Called Woodroe Chen, ED at [facility], confirmed that facility is willing to accept patient back when ready for discharge.  Left message w son's office staff asking that he call w any concerns, otherwise plan was to work towards patient return to [facility] when ready for discharge.  Sallee Lange, LCSW Clinical Social Worker

## 2011-06-17 NOTE — Progress Notes (Signed)
Report called to C. Raul Del, RN on Dept 300.  Pt will transfer to room 332 via wheelchair and be placed on telemetry.  Pt is A&O, VSS.  Pt's belongings taken with pt to new room.

## 2011-06-17 NOTE — Progress Notes (Addendum)
SUBJECTIVE:Feeling about the same. Still some shortness of breath. No chest pain.  Principal Problem:  *Acute on chronic systolic congestive heart failure Active Problems:  COPD (chronic obstructive pulmonary disease)  Peripheral arterial disease  Chronic renal failure  Dementia  LABS: Basic Metabolic Panel:  Basename 06/17/11 0431 06/16/11 0532  NA 139 137  K 4.2 3.4*  CL 102 100  CO2 25 26  GLUCOSE 94 93  BUN 23 19  CREATININE 1.52* 1.44*  CALCIUM 9.5 9.2  MG -- --  PHOS -- --   CBC:  Basename 06/16/11 0920 06/15/11 1008  WBC 8.1 11.0*  NEUTROABS 6.4 9.4*  HGB 10.6* 10.4*  HCT 33.4* 31.7*  MCV 88.1 86.6  PLT 304 280   Cardiac Enzymes:  Basename 06/15/11 1000  CKTOTAL --  CKMB --  CKMBINDEX --  TROPONINI <0.30   RADIOLOGY: Dg Chest Portable 1 View  06/15/2011  *RADIOLOGY REPORT*  Clinical Data: Shortness of breath.  PORTABLE CHEST - 1 VIEW  Comparison: 06/03/2011 the  Findings: Cardiomegaly.  Bilateral lower lobe pulmonary opacities with effusion are consistent with congestive heart failure.  Acute worsening since the previous most recent film.  Calcified tortuous aorta.  No pneumothorax.  Skeletal osteopenia.  IMPRESSION: Cardiomegaly with bilateral lower lobe opacities and bilateral effusions consistent with congestive heart failure.  Original Report Authenticated By: Elsie Stain, M.D.   Echocardiogram 06/16/2011 Left ventricle: The cavity size was normal. There was mild focal basal hypertrophy of the septum. Systolic function was mildly reduced. The estimated ejection fraction was 45%. Akinesis of the basal-midinferolateral, inferior, and inferoseptal myocardium. - Aortic valve: Mildly to moderately calcified annulus. Mildly calcified leaflets. - Aorta: The aorta was moderately calcified. - Mitral valve: Calcified annulus. Mildly thickened, mildly calcified leaflets . Mild to moderate regurgitation. - Left atrium: The atrium was mildly to moderately  dilated. - Atrial septum: No defect or patent foramen ovale was identified. - Pulmonary arteries: Systolic pressure was mildly increased. - Pericardium, extracardiac: There was a moderate-sized right pleural effusion. There was a moderate-sized left pleural effusion.  I&O:  -1.2 L past 36 hrs & -4 L since admit;  Weight:  Decreased 1 kg   PHYSICAL EXAM BP 133/76  Pulse 81  Temp(Src) 98.6 F (37 C) (Oral)  Resp 24  Ht 5\' 3"  (1.6 m)  Wt 141 lb 1.5 oz (64 kg)  BMI 24.99 kg/m2  SpO2 97%Wt loss 2 lbs. General: Well developed, well nourished, in no acute distress Head: Eyes PERRLA, No xanthomas.   Normal cephalic and atramatic  Lungs: Clear bilaterally to auscultation and percussion-exam does not suggest sizable effusions Heart: HRRR S1 S2, No MRG .  Pulses are 2+ & equal.            No carotid bruit. + JVD at 10 cm,  HJR. Marland Kitchen  No abdominal bruits. No femoral bruits. Abdomen: Bowel sounds are positive, abdomen soft and non-tender without masses or                  Hernia's noted. Msk:  Back normal, normal gait.Diminished  strength and tone for age. Extremities: No clubbing, cyanosis or edema.  DP +1 Neuro: Alert and oriented X 3. Psych:  Good affect, responds appropriately  TELEMETRY: Reviewed telemetry pt in SR rate in the 80's.  ASSESSMENT AND PLAN:  1. Acute on Chronic CHF: EF of 45%. Has only diuresed 2 lbs since admission with positive I/O over last 24 hours, after initial diureses of 2,700 cc. Continues  to receive lasix 40 mg IV Q 8 hours. Now has addition of spironolactone 25 mg daily. May need to consider increasing lasix dose, or adding metolazone for better diureses if I/O remains positive. Will change metoprolol to coreg.   2. COPD: Stable, but continues to have dyspnea. Followed by Dr. Juanetta Gosling.  3. Anemia:  Continue to monitor.  Can contribute to CHF if remains low or decreases.   Bettey Mare. Lyman Bishop NP [facility] 06/17/2011, 8:45 AM  Cardiology  Attending Patient interviewed and examined. Discussed with Joni Reining, NP.  Above note annotated and modified based upon my findings.  Continues to diuresis and improve without deterioration in renal function. Exam suggests improvement in pleural effusions-chest x-ray will be obtained.  [place] Bing, MD 06/17/2011, 5:32 PM

## 2011-06-17 NOTE — Progress Notes (Signed)
4am cbg 47. Hypoglycemia protocol followed amp of D50 given. Recheck in 15 mins.

## 2011-06-17 NOTE — Progress Notes (Signed)
Subjective: She says she feels better. She has no new complaints. Her breathing is much improved. She did have some episodes of hypoglycemia last night.  Objective: Vital signs in last 24 hours: Temp:  [97.4 F (36.3 C)-99 F (37.2 C)] 98.6 F (37 C) (06/11 0400) Pulse Rate:  [78-102] 81  (06/11 0600) Resp:  [20-31] 24  (06/11 0600) BP: (97-150)/(50-76) 133/76 mmHg (06/11 0600) SpO2:  [88 %-99 %] 97 % (06/11 0600) Weight:  [64 kg (141 lb 1.5 oz)] 64 kg (141 lb 1.5 oz) (06/11 0500) Weight change: -1.3 kg (-2 lb 13.9 oz) Last BM Date: 06/15/11  Intake/Output from previous day: 06/10 0701 - 06/11 0700 In: 2404 [P.O.:2400; IV Piggyback:4] Out: 3350 [Urine:3350]  PHYSICAL EXAM General appearance: alert, cooperative and no distress Resp: clear to auscultation bilaterally Cardio: irregularly irregular rhythm GI: soft, non-tender; bowel sounds normal; no masses,  no organomegaly Extremities: extremities normal, atraumatic, no cyanosis or edema  Lab Results:    Basic Metabolic Panel:  Basename 06/17/11 0431 06/16/11 0532  NA 139 137  K 4.2 3.4*  CL 102 100  CO2 25 26  GLUCOSE 94 93  BUN 23 19  CREATININE 1.52* 1.44*  CALCIUM 9.5 9.2  MG -- --  PHOS -- --   Liver Function Tests: No results found for this basename: AST:2,ALT:2,ALKPHOS:2,BILITOT:2,PROT:2,ALBUMIN:2 in the last 72 hours No results found for this basename: LIPASE:2,AMYLASE:2 in the last 72 hours No results found for this basename: AMMONIA:2 in the last 72 hours CBC:  Basename 06/16/11 0920 06/15/11 1008  WBC 8.1 11.0*  NEUTROABS 6.4 9.4*  HGB 10.6* 10.4*  HCT 33.4* 31.7*  MCV 88.1 86.6  PLT 304 280   Cardiac Enzymes:  Basename 06/15/11 1000  CKTOTAL --  CKMB --  CKMBINDEX --  TROPONINI <0.30   BNP:  Basename 06/15/11 1008  PROBNP 19603.0*   D-Dimer: No results found for this basename: DDIMER:2 in the last 72 hours CBG:  Basename 06/15/11 1359  GLUCAP 101*   Hemoglobin A1C: No results  found for this basename: HGBA1C in the last 72 hours Fasting Lipid Panel: No results found for this basename: CHOL,HDL,LDLCALC,TRIG,CHOLHDL,LDLDIRECT in the last 72 hours Thyroid Function Tests: No results found for this basename: TSH,T4TOTAL,FREET4,T3FREE,THYROIDAB in the last 72 hours Anemia Panel: No results found for this basename: VITAMINB12,FOLATE,FERRITIN,TIBC,IRON,RETICCTPCT in the last 72 hours Coagulation: No results found for this basename: LABPROT:2,INR:2 in the last 72 hours Urine Drug Screen: Drugs of Abuse  No results found for this basename: labopia, cocainscrnur, labbenz, amphetmu, thcu, labbarb    Alcohol Level: No results found for this basename: ETH:2 in the last 72 hours Urinalysis: No results found for this basename: COLORURINE:2,APPERANCEUR:2,LABSPEC:2,PHURINE:2,GLUCOSEU:2,HGBUR:2,BILIRUBINUR:2,KETONESUR:2,PROTEINUR:2,UROBILINOGEN:2,NITRITE:2,LEUKOCYTESUR:2 in the last 72 hours Misc. Labs:  ABGS  Basename 06/15/11 1955  PHART 7.501*  PO2ART 157.0*  TCO2 22.1  HCO3 25.0*   CULTURES Recent Results (from the past 240 hour(s))  MRSA PCR SCREENING     Status: Normal   Collection Time   06/16/11  4:00 AM      Component Value Range Status Comment   MRSA by PCR NEGATIVE  NEGATIVE  Final    Studies/Results: Dg Chest Portable 1 View  06/15/2011  *RADIOLOGY REPORT*  Clinical Data: Shortness of breath.  PORTABLE CHEST - 1 VIEW  Comparison: 06/03/2011 the  Findings: Cardiomegaly.  Bilateral lower lobe pulmonary opacities with effusion are consistent with congestive heart failure.  Acute worsening since the previous most recent film.  Calcified tortuous aorta.  No pneumothorax.  Skeletal  osteopenia.  IMPRESSION: Cardiomegaly with bilateral lower lobe opacities and bilateral effusions consistent with congestive heart failure.  Original Report Authenticated By: Elsie Stain, M.D.    Medications:  Scheduled:   . amLODipine  5 mg Oral Daily  . amoxicillin-clavulanate   1 tablet Oral BID  . enoxaparin  30 mg Subcutaneous Daily  . escitalopram  10 mg Oral Daily  . furosemide  40 mg Intravenous Q8H  . irbesartan  150 mg Oral Daily  . levETIRAcetam  250 mg Oral BID  . memantine  10 mg Oral BID  . metoprolol  50 mg Oral BID  . nystatin   Topical TID  . potassium chloride  40 mEq Oral TID  . sodium chloride  3 mL Intravenous Q12H  . sodium chloride      . spironolactone  25 mg Oral Daily  . venlafaxine XR  37.5 mg Oral Daily  . DISCONTD: amLODipine  10 mg Oral Daily   Continuous:  WJX:BJYNWG chloride, acetaminophen, acetaminophen, albuterol, albuterol, alum & mag hydroxide-simeth, LORazepam, ondansetron (ZOFRAN) IV, ondansetron, sodium chloride  Assesment: She was admitted with acute systolic congestive heart failure. She has multiple other medical problems. She is much improved after diuresis. Principal Problem:  *Acute on chronic systolic congestive heart failure Active Problems:  COPD (chronic obstructive pulmonary disease)  Peripheral arterial disease  Chronic renal failure  Dementia    Plan: She will be transferred from the ICU today.    LOS: 2 days   Melissa [name] 06/17/2011, 7:54 AM

## 2011-06-18 ENCOUNTER — Inpatient Hospital Stay (HOSPITAL_COMMUNITY): Payer: Medicare Other

## 2011-06-18 LAB — BASIC METABOLIC PANEL
BUN: 29 mg/dL — ABNORMAL HIGH (ref 6–23)
CO2: 25 mEq/L (ref 19–32)
Calcium: 9.1 mg/dL (ref 8.4–10.5)
Creatinine, Ser: 1.66 mg/dL — ABNORMAL HIGH (ref 0.50–1.10)
GFR calc non Af Amer: 27 mL/min — ABNORMAL LOW (ref >90)
Glucose, Bld: 94 mg/dL (ref 70–99)
Sodium: 136 mEq/L (ref 135–145)

## 2011-06-18 MED ORDER — FUROSEMIDE 40 MG PO TABS
40.0000 mg | ORAL_TABLET | Freq: Two times a day (BID) | ORAL | Status: DC
  Administered 2011-06-18 – 2011-06-20 (×3): 40 mg via ORAL
  Filled 2011-06-18 (×2): qty 1

## 2011-06-18 NOTE — Progress Notes (Signed)
[name], [name]                ACCOUNT NO.:  [id number]  MEDICAL RECORD NO.:  [id number]  LOCATION:  A332                          FACILITY:  [facility]  PHYSICIAN:  Melissa [name], M.D.DATE OF BIRTH:  09-22-1925  DATE OF PROCEDURE: DATE OF DISCHARGE:                                PROGRESS NOTE   Melissa [name] was admitted with congestive heart failure.  She seems to be doing better.  She says that she was able to lie flat to breathe last night.  She has no other new complaints.  Her physical examination shows that she is awake and alert.  She is mildly confused.  Her chest is clearer than it has been.  Her heart is irregular.  Her abdomen is soft. She has less edema.  Her temperature is 97.3, pulse 88, respirations 20, blood pressure 127/82, and O2 sats 95%.  Her BUN is up to 29, creatinine up to 1.66.  She is listed as being -952 I and O, I am not sure for getting accurate ins and outs because her weight shows that she is down by several pounds.  Plan then is to continue with current treatments. She has a chest x-ray pending.  She seems to be having better diuresis at least based on the basis of her weight and symptomatically she is improved.     Melissa [name], M.D.     ELH/MEDQ  D:  06/18/2011  T:  06/18/2011  Job:  [id number]

## 2011-06-18 NOTE — Progress Notes (Addendum)
SUBJECTIVE:Feeling better. Breathing improved. Occasional nonproductive coughing.  Principal Problem:  *Acute on chronic systolic congestive heart failure Active Problems:  COPD (chronic obstructive pulmonary disease)  Peripheral arterial disease  Chronic renal failure  Dementia  LABS: Basic Metabolic Panel:  Basename 06/18/11 0553 06/17/11 0431  NA 136 139  K 3.7 4.2  CL 100 102  CO2 25 25  GLUCOSE 94 94  BUN 29* 23  CREATININE 1.66* 1.52*  CALCIUM 9.1 9.5  MG -- --  PHOS -- --   CBC:  Basename 06/16/11 0920 06/15/11 1008  WBC 8.1 11.0*  NEUTROABS 6.4 9.4*  HGB 10.6* 10.4*  HCT 33.4* 31.7*  MCV 88.1 86.6  PLT 304 280   PHYSICAL EXAM BP 127/82  Pulse 88  Temp 97.3 F (36.3 C) (Oral)  Resp 20  Ht 5\' 3"  (1.6 m)  Wt 132 lb 7.9 oz (60.1 kg)  BMI 23.47 kg/m2  SpO2 95%Wt loss 9 lbs General: Well developed, well nourished, in no acute distress Head: Eyes PERRLA, No xanthomas.   Normal cephalic and atramatic  Lungs: Mild expiratory wheezes. Coughing with deep inspiration. Heart: HRRR S1 S2, No MRG .  Pulses are 2+ & equal.            No carotid bruit. No JVD.  No abdominal bruits. No femoral bruits. Abdomen: Bowel sounds are positive, abdomen soft and non-tender without masses or                  Hernia's noted. Msk:  Back normal, Diminished strength and tone for age. Extremities: No clubbing, cyanosis or edema.  DP +1 Neuro: Alert and oriented X 3. Psych:  Good affect, responds appropriately  TELEMETRY: Reviewed telemetry pt in SR in the 70's 80's.   ASSESSMENT AND PLAN:  1. Acute on Chronic Systolic CHF: She has had a total diuresis of 9 pounds since admission on IV Lasix. I will DC the IV dosing and change to by mouth twice a day 40 mg. She is been changed to Coreg 12.5 mg twice a day with good blood pressure and heart rate response. I will DC Foley catheter, check BMET in am. Current creatinine is 1.66 up from 1.52 and 1.44 respectively. Chest x-ray ordered  by Dr. Dietrich Pates is pending.  2. COPD: Stable. Activity has been minimal precluding assessment of possible DOE.  I have asked that she walk in the hallway with assistance.  Bettey Mare. Lyman Bishop NP [facility] 06/18/2011, 9:11 AM  Cardiology Attending Patient interviewed and examined. Discussed with Joni Reining, NP.  Above note annotated and modified based upon my findings.  CXR reveals surprisingly modest improvement. BNP is pending. Renal function has deteriorated slightly. Current treatment is with a modest dose of oral furosemide, which should be appropriate for management of CHF. She can be discharged when physically able to return home, and we will follow her closely as an outpatient.  Baseline weight prior to this month suggests that she may have another 10 pounds of fluid to diuresis.  [employer] [facility], MD 06/18/2011, 11:31 AM

## 2011-06-18 NOTE — Progress Notes (Signed)
Pt refuses PT eval, stating "I'm too tired".  Will try to push a little harder tomorrow.

## 2011-06-19 LAB — BASIC METABOLIC PANEL
BUN: 29 mg/dL — ABNORMAL HIGH (ref 6–23)
Calcium: 9 mg/dL (ref 8.4–10.5)
Creatinine, Ser: 1.44 mg/dL — ABNORMAL HIGH (ref 0.50–1.10)
GFR calc Af Amer: 38 mL/min — ABNORMAL LOW (ref >90)
GFR calc non Af Amer: 33 mL/min — ABNORMAL LOW (ref >90)
Glucose, Bld: 96 mg/dL (ref 70–99)
Potassium: 3.8 mEq/L (ref 3.5–5.1)

## 2011-06-19 MED ORDER — METOLAZONE 5 MG PO TABS
2.5000 mg | ORAL_TABLET | Freq: Once | ORAL | Status: AC
  Administered 2011-06-19: 2.5 mg via ORAL
  Filled 2011-06-19: qty 1

## 2011-06-19 NOTE — Progress Notes (Signed)
Pt oxygen saturation was 91% on room air; notified dr. Juanetta Gosling

## 2011-06-19 NOTE — Progress Notes (Addendum)
SUBJECTIVE:Feeling better. Pleasantly confused.  Basic Metabolic Panel:  Basename 06/19/11 0610 06/18/11 0553  NA 134* 136  K 3.8 3.7  CL 99 100  CO2 23 25  GLUCOSE 96 94  BUN 29* 29*  CREATININE 1.44* 1.66*  CALCIUM 9.0 9.1  MG -- --  PHOS -- --   PHYSICAL EXAM BP 117/72  Pulse 83  Temp 98.6 F (37 C) (Oral)  Resp 20  Ht 5\' 3"  (1.6 m)  Wt 126 lb 12.8 oz (57.516 kg)  BMI 22.46 kg/m2  SpO2 92%WT Loss: 18 lbs General: Well developed, well nourished, in no acute distress Head: Eyes PERRLA, No xanthomas.   Normal cephalic and atramatic  Lungs:Mild crackles in the bases. Heart: HRRR S1 S2, 1/6 systolic murmur..  Pulses are 2+ & equal.            No carotid bruit. No JVD.  No abdominal bruits. No femoral bruits. Abdomen: Bowel sounds are positive, abdomen soft and non-tender without masses or                  Hernia's noted. Msk:  Back normal, normal gait. Normal strength and tone for age. Extremities: No clubbing, cyanosis or edema.  DP +1 Neuro: Alert and oriented X 3. Psych:  Good affect, responds appropriately  TELEMETRY: Reviewed telemetry pt in SR in the 70's.  I&O:  -700 cc yesterday.   Weight: decreased 2.5 Kg  ASSESSMENT AND PLAN:  1. Acute on Chronic CHF: EF 45%. Total diureses of 18 lbs since discharge. She is now on po lasix 40 mg BID along with spironolactone 25 mg daily. Creatinine 1.44. Chest X-ray is unchanged with mild vascular congestion and mild perihilar interstitial edema. Stable bilateral small pleural effusions. Plans for discharge tomorrow. Will give one dose of metolazone this am to be completely euvolemic prior to discharge with BMET in am.   Bettey Mare. Lyman Bishop NP [facility] 06/19/2011, 9:24 AM  Cardiology Attending Patient interviewed and examined. Discussed with Joni Reining, NP.  Above note annotated and modified based upon my findings.  Continues to diurese and improve.  Renal function stable to improved with treatment of fluid  overload.  Current medical regime appears to be effective and well-tolerated.  [place] Bing, MD 06/19/2011, 6:02 PM

## 2011-06-19 NOTE — Clinical Social Work Note (Signed)
Woodroe Chen, ED, notified that PT is concerned about patient's ability to ambulate independently.  Told facility that PT did not think patient should walk independently at this point until she regains her strength.  Facility agreeable, and stated patient will be in wheelchair when not in bed until stronger.  [facility] willing to accept patient when ready to discharge.  Asked that we call Whitney at facility w any other concerns or needs today as ED and other staff are out of facility all day.  Sallee Lange, [place] Clinical Social [place] [phone])

## 2011-06-19 NOTE — Progress Notes (Signed)
Subjective: She says she feels better. She did not want to do physical therapy yesterday. Her chest x-ray is essentially unchanged but she seems much more comfortable.  Objective: Vital signs in last 24 hours: Temp:  [97.6 F (36.4 C)-98.6 F (37 C)] 98.6 F (37 C) (06/13 0553) Pulse Rate:  [81-96] 83  (06/13 0553) Resp:  [19-20] 20  (06/13 0553) BP: (86-117)/(53-72) 117/72 mmHg (06/13 0553) SpO2:  [92 %-97 %] 92 % (06/13 0553) Weight:  [57.516 kg (126 lb 12.8 oz)] 57.516 kg (126 lb 12.8 oz) (06/13 0553) Weight change: -2.584 kg (-5 lb 11.1 oz) Last BM Date: 06/18/11  Intake/Output from previous day: 06/12 0701 - 06/13 0700 In: 600 [P.O.:600] Out: 1325 [Urine:1325]  PHYSICAL EXAM General appearance: alert, cooperative and mild distress Resp: rales bilaterally Cardio: irregularly irregular rhythm GI: soft, non-tender; bowel sounds normal; no masses,  no organomegaly Extremities: extremities normal, atraumatic, no cyanosis or edema  Lab Results:    Basic Metabolic Panel:  Basename 06/19/11 0610 06/18/11 0553  NA 134* 136  K 3.8 3.7  CL 99 100  CO2 23 25  GLUCOSE 96 94  BUN 29* 29*  CREATININE 1.44* 1.66*  CALCIUM 9.0 9.1  MG -- --  PHOS -- --   Liver Function Tests: No results found for this basename: AST:2,ALT:2,ALKPHOS:2,BILITOT:2,PROT:2,ALBUMIN:2 in the last 72 hours No results found for this basename: LIPASE:2,AMYLASE:2 in the last 72 hours No results found for this basename: AMMONIA:2 in the last 72 hours CBC:  Basename 06/16/11 0920  WBC 8.1  NEUTROABS 6.4  HGB 10.6*  HCT 33.4*  MCV 88.1  PLT 304   Cardiac Enzymes: No results found for this basename: CKTOTAL:3,CKMB:3,CKMBINDEX:3,TROPONINI:3 in the last 72 hours BNP: No results found for this basename: PROBNP:3 in the last 72 hours D-Dimer: No results found for this basename: DDIMER:2 in the last 72 hours CBG: No results found for this basename: GLUCAP:6 in the last 72 hours Hemoglobin A1C: No  results found for this basename: HGBA1C in the last 72 hours Fasting Lipid Panel: No results found for this basename: CHOL,HDL,LDLCALC,TRIG,CHOLHDL,LDLDIRECT in the last 72 hours Thyroid Function Tests: No results found for this basename: TSH,T4TOTAL,FREET4,T3FREE,THYROIDAB in the last 72 hours Anemia Panel: No results found for this basename: VITAMINB12,FOLATE,FERRITIN,TIBC,IRON,RETICCTPCT in the last 72 hours Coagulation: No results found for this basename: LABPROT:2,INR:2 in the last 72 hours Urine Drug Screen: Drugs of Abuse  No results found for this basename: labopia, cocainscrnur, labbenz, amphetmu, thcu, labbarb    Alcohol Level: No results found for this basename: ETH:2 in the last 72 hours Urinalysis: No results found for this basename: COLORURINE:2,APPERANCEUR:2,LABSPEC:2,PHURINE:2,GLUCOSEU:2,HGBUR:2,BILIRUBINUR:2,KETONESUR:2,PROTEINUR:2,UROBILINOGEN:2,NITRITE:2,LEUKOCYTESUR:2 in the last 72 hours Misc. Labs:  ABGS No results found for this basename: PHART,PCO2,PO2ART,TCO2,HCO3 in the last 72 hours CULTURES Recent Results (from the past 240 hour(s))  MRSA PCR SCREENING     Status: Normal   Collection Time   06/16/11  4:00 AM      Component Value Range Status Comment   MRSA by PCR NEGATIVE  NEGATIVE Final    Studies/Results: Dg Chest Port 1 View  06/18/2011  *RADIOLOGY REPORT*  Clinical Data: Congestive heart failure  PORTABLE CHEST - 1 VIEW  Comparison: 06/15/2011  Findings: Cardiomegaly again noted.  Atherosclerotic calcifications of the thoracic aorta.  .  Stable central mild vascular congestion and mild perihilar interstitial edema. Stable bilateral small pleural effusion with bilateral basilar atelectasis or infiltrate .  IMPRESSION:  Stable central mild vascular congestion and mild perihilar interstitial edema. Stable bilateral small  pleural effusion with bilateral basilar atelectasis or infiltrate .  Original Report Authenticated By: Natasha Mead, M.D.    Medications:    Prior to Admission:  Prescriptions prior to admission  Medication Sig Dispense Refill  . albuterol (PROVENTIL HFA;VENTOLIN HFA) 108 (90 BASE) MCG/ACT inhaler Inhale 2 puffs into the lungs every 6 (six) hours as needed for wheezing.  1 Inhaler  2  . amLODipine (NORVASC) 10 MG tablet Take 10 mg by mouth daily.        Marland Kitchen amoxicillin-clavulanate (AUGMENTIN) 875-125 MG per tablet Take 1 tablet by mouth 2 (two) times daily.  10 tablet  1  . escitalopram (LEXAPRO) 10 MG tablet Take 10 mg by mouth daily.        . furosemide (LASIX) 20 MG tablet Take 20 mg by mouth 2 (two) times daily.      Marland Kitchen levETIRAcetam (KEPPRA) 250 MG tablet Take 250 mg by mouth 2 (two) times daily.        Marland Kitchen LORazepam (ATIVAN) 0.5 MG tablet Take 0.5 mg by mouth 2 (two) times daily as needed. For anxiety      . magnesium oxide (MAG-OX) 400 MG tablet Take 400 mg by mouth 2 (two) times daily.      . memantine (NAMENDA) 10 MG tablet Take 10 mg by mouth 2 (two) times daily.      . metoprolol (LOPRESSOR) 50 MG tablet Take 50 mg by mouth 2 (two) times daily.       Marland Kitchen venlafaxine (EFFEXOR-XR) 37.5 MG 24 hr capsule Take 37.5 mg by mouth daily.        Marland Kitchen zolpidem (AMBIEN) 5 MG tablet Take 5 mg by mouth at bedtime as needed. For sleep      . alum & mag hydroxide-simeth (MAALOX/MYLANTA) 200-200-20 MG/5ML suspension Take 10 mLs by mouth every 4 (four) hours as needed. For indigestion      . diphenoxylate-atropine (LOMOTIL) 2.5-0.025 MG per tablet Take 1 tablet by mouth 4 (four) times daily as needed. For diarrhea      . donepezil (ARICEPT) 10 MG tablet Take 10 mg by mouth at bedtime.        Marland Kitchen olmesartan (BENICAR) 40 MG tablet Take 40 mg by mouth daily.        . potassium chloride (K-DUR,KLOR-CON) 10 MEQ tablet Take 10 mEq by mouth every other day.       Scheduled:   . amoxicillin-clavulanate  1 tablet Oral BID  . carvedilol  12.5 mg Oral BID WC  . enoxaparin  30 mg Subcutaneous Daily  . escitalopram  10 mg Oral Daily  . furosemide  40 mg  Oral BID  . irbesartan  150 mg Oral Daily  . levETIRAcetam  250 mg Oral BID  . memantine  10 mg Oral BID  . nystatin   Topical TID  . spironolactone  25 mg Oral Daily  . venlafaxine XR  37.5 mg Oral Daily  . DISCONTD: amLODipine  5 mg Oral Daily  . DISCONTD: furosemide  40 mg Intravenous Q8H   Continuous:  WUJ:[id number], acetaminophen, albuterol, albuterol, alum & mag hydroxide-simeth, LORazepam, ondansetron (ZOFRAN) IV, ondansetron  Assesment: She is admitted with acute on chronic systolic congestive heart failure. She has had a recent episode of pneumonia. She is better. Her medications have been changed. I'm going to see how she does with physical therapy and is a possibility that she could go home later today. Principal Problem:  *Acute on chronic systolic congestive heart failure Active Problems:  COPD (chronic obstructive pulmonary disease)  Peripheral arterial disease  Chronic renal failure  Dementia    Plan: PT evaluation today try to get her up and moving around see how she does with her breathing with that and if she does okay she could potentially go back to her assisted living facility today.    LOS: 4 days   Abrar Koone L 06/19/2011, 8:46 AM

## 2011-06-19 NOTE — Evaluation (Signed)
Physical Therapy Evaluation Patient Details Name: Melissa [name] MRN: [id number] DOB: [birth year] Today's Date: 06/19/2011 Time: 4098-1191 PT Time Calculation (min): 39 min  PT Assessment / Plan / Recommendation Clinical Impression  Pt cooperative, but appears very anxious.  Strength on MMT is WNL and she needs only SBA for transfers OOB.  Once approached with the task of walking, however, she became more anxious and stated  "I don't think I can do it".  Gait was very unsatble with a walker and max assist was needed to keep her on her feet.  She was unable to follow my instructions in order to gain better stability.  My sense is that her functional ability was overcome by her emotional state.  There is no other  reason of which I am aware that would  cause such a rapid decline in gait.  I spoke with Nurse Case Manager about this to be sure that ACLF is aware of her gait instability.           PT Assessment  All further PT needs can be met in the next venue of care    Follow Up Recommendations  Home health PT    Barriers to Discharge None      lEquipment Recommendations       Recommendations for Other Services     Frequency      Precautions / Restrictions Precautions Precautions: Fall Restrictions Weight Bearing Restrictions: No   Pertinent Vitals/Pain       Mobility  Bed Mobility Bed Mobility: Supine to Sit;Sit to Supine Supine to Sit: 6: Modified independent (Device/Increase time) Sit to Supine: 6: Modified independent (Device/Increase time) Transfers Transfers: Sit to Stand;Stand to Sit Sit to Stand: 4: Min assist;With upper extremity assist;From bed;From chair/3-in-1 Stand to Sit: 4: Min assist;To chair/3-in-1;With upper extremity assist Ambulation/Gait Ambulation/Gait Assistance: 2: Max assist Ambulation Distance (Feet): 25 Feet Assistive device: Rolling walker Ambulation/Gait Assistance Details: pt fails to keep walker close enough to her, maintains knees in  flexion...therefore, max assist needed to maintain her stance Gait Pattern: Step-through pattern;Left flexed knee in stance;Right flexed knee in stance;Trunk flexed;Shuffle General Gait Details: pt appears very anxious and feels that she is unale to walk...even though strength on MMT is WNL, she functionally demonstrates poor quad strength Stairs: No Wheelchair Mobility Wheelchair Mobility: No    Exercises General Exercises - Lower Extremity Short Arc Quad: AROM;Both;10 reps;Supine Heel Slides: AROM;Both;10 reps;Supine Straight Leg Raises: AROM;Both;10 reps;Supine   PT Diagnosis:    PT Problem List: Decreased activity tolerance;Decreased mobility;Cardiopulmonary status limiting activity PT Treatment Interventions:     PT Goals    Visit Information  Last PT Received On: 06/19/11    Subjective Data  Subjective: I'm so weak Patient Stated Goal: none stated   Prior Functioning  Home Living Available Help at Discharge: Personal care attendant Type of Home: Assisted living Home Access: Level entry Home Adaptive Equipment: Walker - rolling;Wheelchair - manual Prior Function Level of Independence: Needs assistance Able to Take Stairs?: No Driving: No Vocation: Retired Musician: No difficulties    Cognition  Overall Cognitive Status: History of cognitive impairments - at baseline Arousal/Alertness: Awake/alert Orientation Level: Appears intact for tasks assessed Behavior During Session: [facility] for tasks performed    Extremity/Trunk Assessment Right Upper Extremity Assessment RUE ROM/Strength/Tone: Within functional levels Left Upper Extremity Assessment LUE ROM/Strength/Tone: Within functional levels Right Lower Extremity Assessment RLE ROM/Strength/Tone: Within functional levels Left Lower Extremity Assessment LLE ROM/Strength/Tone: Within functional levels   Balance Balance  Balance Assessed: No  End of Session PT - End of Session Equipment Utilized  During Treatment: Gait belt Activity Tolerance: Patient limited by fatigue Patient left: in chair;with chair alarm set Nurse Communication: Mobility status   Konrad Penta 06/19/2011, 11:36 AM

## 2011-06-20 LAB — BASIC METABOLIC PANEL
BUN: 29 mg/dL — ABNORMAL HIGH (ref 6–23)
Calcium: 9.2 mg/dL (ref 8.4–10.5)
Creatinine, Ser: 1.44 mg/dL — ABNORMAL HIGH (ref 0.50–1.10)
GFR calc Af Amer: 38 mL/min — ABNORMAL LOW (ref >90)
GFR calc non Af Amer: 33 mL/min — ABNORMAL LOW (ref >90)

## 2011-06-20 MED ORDER — CARVEDILOL 12.5 MG PO TABS: 12.5000 mg | ORAL_TABLET | Freq: Two times a day (BID) | ORAL | Status: DC

## 2011-06-20 MED ORDER — SPIRONOLACTONE 25 MG PO TABS: 25.0000 mg | ORAL_TABLET | Freq: Every day | ORAL | Status: DC

## 2011-06-20 MED ORDER — SODIUM CHLORIDE 0.9 % IJ SOLN
INTRAMUSCULAR | Status: AC
  Filled 2011-06-20: qty 3

## 2011-06-20 MED ORDER — FUROSEMIDE 40 MG PO TABS: 40.0000 mg | ORAL_TABLET | Freq: Two times a day (BID) | ORAL | Status: AC

## 2011-06-20 NOTE — Discharge Summary (Addendum)
Physician Discharge Summary  Patient ID: Melissa [name] MRN: [id number] DOB/AGE: October 13, 1925 78 y.o. Primary Care Physician:ROBSON,MICHAEL GAVIN, MD Admit date: 06/15/2011 Discharge date: 06/20/2011    Discharge Diagnoses:   Principal Problem:  *Acute on chronic systolic congestive heart failure Active Problems:  COPD (chronic obstructive pulmonary disease)  Peripheral arterial disease  Chronic renal failure  Dementia   Medication List  As of 06/20/2011  8:57 AM   STOP taking these medications         amoxicillin-clavulanate 875-125 MG per tablet      metoprolol 50 MG tablet         TAKE these medications         albuterol 108 (90 BASE) MCG/ACT inhaler   Commonly known as: PROVENTIL HFA;VENTOLIN HFA   Inhale 2 puffs into the lungs every 6 (six) hours as needed for wheezing.      alum & mag hydroxide-simeth 200-200-20 MG/5ML suspension   Commonly known as: MAALOX/MYLANTA   Take 10 mLs by mouth every 4 (four) hours as needed. For indigestion      amLODipine 10 MG tablet   Commonly known as: NORVASC   Take 10 mg by mouth daily.      carvedilol 12.5 MG tablet   Commonly known as: COREG   Take 1 tablet (12.5 mg total) by mouth 2 (two) times daily with a meal.      diphenoxylate-atropine 2.5-0.025 MG per tablet   Commonly known as: LOMOTIL   Take 1 tablet by mouth 4 (four) times daily as needed. For diarrhea      donepezil 10 MG tablet   Commonly known as: ARICEPT   Take 10 mg by mouth at bedtime.      escitalopram 10 MG tablet   Commonly known as: LEXAPRO   Take 10 mg by mouth daily.      furosemide 40 MG tablet   Commonly known as: LASIX   Take 1 tablet (40 mg total) by mouth 2 (two) times daily.      levETIRAcetam 250 MG tablet   Commonly known as: KEPPRA   Take 250 mg by mouth 2 (two) times daily.      LORazepam 0.5 MG tablet   Commonly known as: ATIVAN   Take 0.5 mg by mouth 2 (two) times daily as needed. For anxiety      magnesium oxide 400 MG tablet     Commonly known as: MAG-OX   Take 400 mg by mouth 2 (two) times daily.      memantine 10 MG tablet   Commonly known as: NAMENDA   Take 10 mg by mouth 2 (two) times daily.      olmesartan 40 MG tablet   Commonly known as: BENICAR   Take 40 mg by mouth daily.      potassium chloride 10 MEQ tablet   Commonly known as: K-DUR,KLOR-CON   Take 10 mEq by mouth every other day.      venlafaxine XR 37.5 MG 24 hr capsule   Commonly known as: EFFEXOR-XR   Take 37.5 mg by mouth daily.      zolpidem 5 MG tablet   Commonly known as: AMBIEN   Take 5 mg by mouth at bedtime as needed. For sleep            Discharged Condition: Improved    Consults: Cardiology  Significant Diagnostic Studies: Dg Chest 2 View  06/03/2011  *RADIOLOGY REPORT*  Clinical Data: Shortness of breath, chest pressure, cough and  fever.  History of COPD and CHF.  CHEST - 2 VIEW  Comparison: 01/21/2010  Findings: New, asymmetric airspace opacity primarily in the right middle lobe but also likely with extension into the upper and lower lobes is suspicious for acute pneumonia.  Superimposed on this is probable mild interstitial edema with small bilateral pleural effusions.  The heart size is stable.  IMPRESSION: New right-sided pulmonary airspace disease suspicious for acute pneumonia.  Mild superimposed interstitial edema.  Original Report Authenticated By: Reola Calkins, M.D.   Dg Chest Port 1 View  06/18/2011  *RADIOLOGY REPORT*  Clinical Data: Congestive heart failure  PORTABLE CHEST - 1 VIEW  Comparison: 06/15/2011  Findings: Cardiomegaly again noted.  Atherosclerotic calcifications of the thoracic aorta.  .  Stable central mild vascular congestion and mild perihilar interstitial edema. Stable bilateral small pleural effusion with bilateral basilar atelectasis or infiltrate .  IMPRESSION:  Stable central mild vascular congestion and mild perihilar interstitial edema. Stable bilateral small pleural effusion with  bilateral basilar atelectasis or infiltrate .  Original Report Authenticated By: Natasha Mead, M.D.   Dg Chest Portable 1 View  06/15/2011  *RADIOLOGY REPORT*  Clinical Data: Shortness of breath.  PORTABLE CHEST - 1 VIEW  Comparison: 06/03/2011 the  Findings: Cardiomegaly.  Bilateral lower lobe pulmonary opacities with effusion are consistent with congestive heart failure.  Acute worsening since the previous most recent film.  Calcified tortuous aorta.  No pneumothorax.  Skeletal osteopenia.  IMPRESSION: Cardiomegaly with bilateral lower lobe opacities and bilateral effusions consistent with congestive heart failure.  Original Report Authenticated By: Elsie Stain, M.D.    Lab Results: Basic Metabolic Panel:  Basename 06/20/11 0514 06/19/11 0610  NA 130* 134*  K 3.8 3.8  CL 94* 99  CO2 23 23  GLUCOSE 87 96  BUN 29* 29*  CREATININE 1.44* 1.44*  CALCIUM 9.2 9.0  MG -- --  PHOS -- --   Liver Function Tests: No results found for this basename: AST:2,ALT:2,ALKPHOS:2,BILITOT:2,PROT:2,ALBUMIN:2 in the last 72 hours   CBC: No results found for this basename: WBC:2,NEUTROABS:2,HGB:2,HCT:2,MCV:2,PLT:2 in the last 72 hours  Recent Results (from the past 240 hour(s))  MRSA PCR SCREENING     Status: Normal   Collection Time   06/16/11  4:00 AM      Component Value Range Status Comment   MRSA by PCR NEGATIVE  NEGATIVE Final      Hospital Course: She was admitted with acute systolic congestive heart failure. She has chronic problems with that of an acute exacerbation. She was markedly short of breath wheezing and had mild peripheral edema. She was started on intravenous Lasix but had more respiratory distress was placed on BiPAP and moved to the step down unit for about 36 hours. She improved with diuresis. She is deconditioned but has been working some with physical therapy. Her breathing has improved and she's back to baseline  Discharge Exam: Blood pressure 131/65, pulse 78, temperature 98.3  F (36.8 C), temperature source Oral, resp. rate 18, height 5\' 3"  (1.6 m), weight 58.06 kg (128 lb), SpO2 98.00%. Her chest is much clearer. Her heart is still somewhat irregular. She does not have any edema. She is mildly confused as always.  Disposition: She will return to her assisted living facility. That facility provides physical therapy and she will have physical therapy as well as the medications noted  Discharge Orders    Future Orders Please Complete By Expires   Discharge patient  She will also be on spironolactone 25 mg daily   Signed: Rajat Staver Elbert Ewings Pager [phone]  06/20/2011, 8:57 AM

## 2011-06-20 NOTE — Progress Notes (Signed)
Subjective: She says she feels much better today. She is more alert. She denies any shortness of breath. She was able to sleep flat last night. She is still somewhat agitated.  Objective: Vital signs in last 24 hours: Temp:  [97.4 F (36.3 C)-98.3 F (36.8 C)] 98.3 F (36.8 C) (06/14 0713) Pulse Rate:  [77-88] 78  (06/14 0830) Resp:  [18-20] 18  (06/14 0830) BP: (90-131)/(55-76) 131/65 mmHg (06/14 0713) SpO2:  [90 %-98 %] 98 % (06/14 0830) Weight:  [58.06 kg (128 lb)] 58.06 kg (128 lb) (06/14 0713) Weight change:  Last BM Date: 06/19/11  Intake/Output from previous day: 06/13 0701 - 06/14 0700 In: 800 [P.O.:800] Out: 200 [Urine:200]  PHYSICAL EXAM General appearance: alert, cooperative, no distress and Pleasantly confused Resp: clear to auscultation bilaterally Cardio: irregularly irregular rhythm GI: soft, non-tender; bowel sounds normal; no masses,  no organomegaly Extremities: extremities normal, atraumatic, no cyanosis or edema  Lab Results:    Basic Metabolic Panel:  Basename 06/20/11 0514 06/19/11 0610  NA 130* 134*  K 3.8 3.8  CL 94* 99  CO2 23 23  GLUCOSE 87 96  BUN 29* 29*  CREATININE 1.44* 1.44*  CALCIUM 9.2 9.0  MG -- --  PHOS -- --   Liver Function Tests: No results found for this basename: AST:2,ALT:2,ALKPHOS:2,BILITOT:2,PROT:2,ALBUMIN:2 in the last 72 hours No results found for this basename: LIPASE:2,AMYLASE:2 in the last 72 hours No results found for this basename: AMMONIA:2 in the last 72 hours CBC: No results found for this basename: WBC:2,NEUTROABS:2,HGB:2,HCT:2,MCV:2,PLT:2 in the last 72 hours Cardiac Enzymes: No results found for this basename: CKTOTAL:3,CKMB:3,CKMBINDEX:3,TROPONINI:3 in the last 72 hours BNP: No results found for this basename: PROBNP:3 in the last 72 hours D-Dimer: No results found for this basename: DDIMER:2 in the last 72 hours CBG: No results found for this basename: GLUCAP:6 in the last 72 hours Hemoglobin  A1C: No results found for this basename: HGBA1C in the last 72 hours Fasting Lipid Panel: No results found for this basename: CHOL,HDL,LDLCALC,TRIG,CHOLHDL,LDLDIRECT in the last 72 hours Thyroid Function Tests: No results found for this basename: TSH,T4TOTAL,FREET4,T3FREE,THYROIDAB in the last 72 hours Anemia Panel: No results found for this basename: VITAMINB12,FOLATE,FERRITIN,TIBC,IRON,RETICCTPCT in the last 72 hours Coagulation: No results found for this basename: LABPROT:2,INR:2 in the last 72 hours Urine Drug Screen: Drugs of Abuse  No results found for this basename: labopia, cocainscrnur, labbenz, amphetmu, thcu, labbarb    Alcohol Level: No results found for this basename: ETH:2 in the last 72 hours Urinalysis: No results found for this basename: COLORURINE:2,APPERANCEUR:2,LABSPEC:2,PHURINE:2,GLUCOSEU:2,HGBUR:2,BILIRUBINUR:2,KETONESUR:2,PROTEINUR:2,UROBILINOGEN:2,NITRITE:2,LEUKOCYTESUR:2 in the last 72 hours Misc. Labs:  ABGS No results found for this basename: PHART,PCO2,PO2ART,TCO2,HCO3 in the last 72 hours CULTURES Recent Results (from the past 240 hour(s))  MRSA PCR SCREENING     Status: Normal   Collection Time   06/16/11  4:00 AM      Component Value Range Status Comment   MRSA by PCR NEGATIVE  NEGATIVE Final    Studies/Results: Dg Chest Port 1 View  06/18/2011  *RADIOLOGY REPORT*  Clinical Data: Congestive heart failure  PORTABLE CHEST - 1 VIEW  Comparison: 06/15/2011  Findings: Cardiomegaly again noted.  Atherosclerotic calcifications of the thoracic aorta.  .  Stable central mild vascular congestion and mild perihilar interstitial edema. Stable bilateral small pleural effusion with bilateral basilar atelectasis or infiltrate .  IMPRESSION:  Stable central mild vascular congestion and mild perihilar interstitial edema. Stable bilateral small pleural effusion with bilateral basilar atelectasis or infiltrate .  Original Report Authenticated  By: Lang Snow POP, M.D.     Medications:  Prior to Admission:  Prescriptions prior to admission  Medication Sig Dispense Refill  . albuterol (PROVENTIL HFA;VENTOLIN HFA) 108 (90 BASE) MCG/ACT inhaler Inhale 2 puffs into the lungs every 6 (six) hours as needed for wheezing.  1 Inhaler  2  . amLODipine (NORVASC) 10 MG tablet Take 10 mg by mouth daily.        Marland Kitchen amoxicillin-clavulanate (AUGMENTIN) 875-125 MG per tablet Take 1 tablet by mouth 2 (two) times daily.  10 tablet  1  . escitalopram (LEXAPRO) 10 MG tablet Take 10 mg by mouth daily.        . furosemide (LASIX) 20 MG tablet Take 20 mg by mouth 2 (two) times daily.      Marland Kitchen levETIRAcetam (KEPPRA) 250 MG tablet Take 250 mg by mouth 2 (two) times daily.        Marland Kitchen LORazepam (ATIVAN) 0.5 MG tablet Take 0.5 mg by mouth 2 (two) times daily as needed. For anxiety      . magnesium oxide (MAG-OX) 400 MG tablet Take 400 mg by mouth 2 (two) times daily.      . memantine (NAMENDA) 10 MG tablet Take 10 mg by mouth 2 (two) times daily.      . metoprolol (LOPRESSOR) 50 MG tablet Take 50 mg by mouth 2 (two) times daily.       Marland Kitchen venlafaxine (EFFEXOR-XR) 37.5 MG 24 hr capsule Take 37.5 mg by mouth daily.        Marland Kitchen zolpidem (AMBIEN) 5 MG tablet Take 5 mg by mouth at bedtime as needed. For sleep      . alum & mag hydroxide-simeth (MAALOX/MYLANTA) 200-200-20 MG/5ML suspension Take 10 mLs by mouth every 4 (four) hours as needed. For indigestion      . diphenoxylate-atropine (LOMOTIL) 2.5-0.025 MG per tablet Take 1 tablet by mouth 4 (four) times daily as needed. For diarrhea      . donepezil (ARICEPT) 10 MG tablet Take 10 mg by mouth at bedtime.        Marland Kitchen olmesartan (BENICAR) 40 MG tablet Take 40 mg by mouth daily.        . potassium chloride (K-DUR,KLOR-CON) 10 MEQ tablet Take 10 mEq by mouth every other day.       Scheduled:   . amoxicillin-clavulanate  1 tablet Oral BID  . carvedilol  12.5 mg Oral BID WC  . enoxaparin  30 mg Subcutaneous Daily  . escitalopram  10 mg Oral Daily  .  furosemide  40 mg Oral BID  . irbesartan  150 mg Oral Daily  . levETIRAcetam  250 mg Oral BID  . memantine  10 mg Oral BID  . metolazone  2.5 mg Oral Once  . nystatin   Topical TID  . spironolactone  25 mg Oral Daily  . venlafaxine XR  37.5 mg Oral Daily   Continuous:  WJX:[id number], acetaminophen, albuterol, albuterol, alum & mag hydroxide-simeth, LORazepam, ondansetron (ZOFRAN) IV, ondansetron  Assesment: She was admitted with acute on chronic systolic congestive heart failure. She is much improved. She is ready for discharge. Principal Problem:  *Acute on chronic systolic congestive heart failure Active Problems:  COPD (chronic obstructive pulmonary disease)  Peripheral arterial disease  Chronic renal failure  Dementia    Plan: She will be discharged back to her assisted living facility    LOS: 5 days   Zaila Crew L 06/20/2011, 8:43 AM

## 2011-06-20 NOTE — Progress Notes (Signed)
Patient d/c to [facility], tranported by [employer] floor via wheel chair D/c packet sent with staff  No c/o pain at d/c [employer] [employer]

## 2011-06-20 NOTE — Clinical Social Work Note (Signed)
Patient discharging today and returning to [facility].  Facility notified and agreeable, will pick up patient in facility van at 11:30 AM.  Son notified of discharge and agreeable.  FL2 reviewed w Network engineer.  Discharge packet prepared and ready for pick up.  Discharge summary, medications list and FL2 faxed to facility.  MD to include discharge addendum to be sent w patient when available.    Clovis Cao Clinical Social Worker [phone])

## 2011-08-07 ENCOUNTER — Encounter: Payer: Self-pay | Admitting: Vascular Surgery

## 2012-04-22 ENCOUNTER — Ambulatory Visit (INDEPENDENT_AMBULATORY_CARE_PROVIDER_SITE_OTHER): Payer: Medicare Other | Admitting: Otolaryngology

## 2012-04-22 DIAGNOSIS — H903 Sensorineural hearing loss, bilateral: Secondary | ICD-10-CM

## 2012-05-31 ENCOUNTER — Encounter (HOSPITAL_COMMUNITY): Payer: Self-pay | Admitting: Emergency Medicine

## 2012-05-31 ENCOUNTER — Emergency Department (HOSPITAL_COMMUNITY)
Admission: EM | Admit: 2012-05-31 | Discharge: 2012-06-01 | Disposition: A | Discharge status: Home / Self Care | Payer: Medicare Other | Attending: Emergency Medicine | Admitting: Emergency Medicine

## 2012-05-31 DIAGNOSIS — H21569 Pupillary abnormality, unspecified eye: Secondary | ICD-10-CM | POA: Insufficient documentation

## 2012-05-31 DIAGNOSIS — Z79899 Other long term (current) drug therapy: Secondary | ICD-10-CM | POA: Insufficient documentation

## 2012-05-31 DIAGNOSIS — J4489 Other specified chronic obstructive pulmonary disease: Secondary | ICD-10-CM | POA: Insufficient documentation

## 2012-05-31 DIAGNOSIS — Z8719 Personal history of other diseases of the digestive system: Secondary | ICD-10-CM | POA: Insufficient documentation

## 2012-05-31 DIAGNOSIS — Z8669 Personal history of other diseases of the nervous system and sense organs: Secondary | ICD-10-CM | POA: Insufficient documentation

## 2012-05-31 DIAGNOSIS — E119 Type 2 diabetes mellitus without complications: Secondary | ICD-10-CM | POA: Insufficient documentation

## 2012-05-31 DIAGNOSIS — Z8739 Personal history of other diseases of the musculoskeletal system and connective tissue: Secondary | ICD-10-CM | POA: Insufficient documentation

## 2012-05-31 DIAGNOSIS — J449 Chronic obstructive pulmonary disease, unspecified: Secondary | ICD-10-CM | POA: Insufficient documentation

## 2012-05-31 DIAGNOSIS — Z87891 Personal history of nicotine dependence: Secondary | ICD-10-CM | POA: Insufficient documentation

## 2012-05-31 DIAGNOSIS — F329 Major depressive disorder, single episode, unspecified: Secondary | ICD-10-CM | POA: Insufficient documentation

## 2012-05-31 DIAGNOSIS — H5702 Anisocoria: Secondary | ICD-10-CM

## 2012-05-31 DIAGNOSIS — I10 Essential (primary) hypertension: Secondary | ICD-10-CM | POA: Insufficient documentation

## 2012-05-31 DIAGNOSIS — F3289 Other specified depressive episodes: Secondary | ICD-10-CM | POA: Insufficient documentation

## 2012-05-31 DIAGNOSIS — F411 Generalized anxiety disorder: Secondary | ICD-10-CM | POA: Insufficient documentation

## 2012-05-31 DIAGNOSIS — I509 Heart failure, unspecified: Secondary | ICD-10-CM | POA: Insufficient documentation

## 2012-05-31 HISTORY — DX: Gout, unspecified: M10.9

## 2012-05-31 HISTORY — DX: Unspecified osteoarthritis, unspecified site: M19.90

## 2012-05-31 NOTE — ED Notes (Signed)
Patient resident at [facility]. Patient denies any pain, symptoms, or anything out of the ordinary other than having more generalized weakness over the past 2 days and being stressed out. Nurse at [facility] reports noticed patient's left pupil is larger than right pupil, and this is abnormal for patient.

## 2012-06-01 NOTE — ED Provider Notes (Signed)
History     CSN: [id number]  Arrival date & time 05/31/12  2159   First MD Initiated Contact with Patient 05/31/12 2335      Chief Complaint  Patient presents with  . Eye Problem    (Consider location/radiation/quality/duration/timing/severity/associated sxs/prior treatment) HPI.... [facility] reports left pupil is bigger than right.   Patient says she is stressed out but has no other complaints.   No chest pain, dyspnea, dysuria, neuro deficits, weakness, abdominal pain, stiff neck.  Severity is mild. Nothing makes symptoms better or worse.  No visual complaints  Past Medical History  Diagnosis Date  . COPD (chronic obstructive pulmonary disease)   . CHF (congestive heart failure)   . Delirium   . Metabolic alkalosis   . Hypertension   . Diabetes mellitus   . Anxiety   . Depression   . Panic attacks   . Chronic diarrhea of unknown origin   . Gout   . Arthritis     Past Surgical History  Procedure Laterality Date  . Inguinal hernia repair      right    History reviewed. No pertinent family history.  History  Substance Use Topics  . Smoking status: Former Games developer  . Smokeless tobacco: Not on file  . Alcohol Use: No    OB History   Grav Para Term Preterm Abortions TAB SAB Ect Mult Living                  Review of Systems  All other systems reviewed and are negative.    Allergies  Shellfish allergy  Home Medications   Current Outpatient Rx  Name  Route  Sig  Dispense  Refill  . amLODipine (NORVASC) 10 MG tablet   Oral   Take 10 mg by mouth daily.           . carvedilol (COREG) 12.5 MG tablet   Oral   Take 1 tablet (12.5 mg total) by mouth 2 (two) times daily with a meal.   60 tablet   12   . diphenoxylate-atropine (LOMOTIL) 2.5-0.025 MG per tablet   Oral   Take 1 tablet by mouth 4 (four) times daily as needed. For diarrhea         . escitalopram (LEXAPRO) 10 MG tablet   Oral   Take 10 mg by mouth daily.           . furosemide  (LASIX) 40 MG tablet   Oral   Take 1 tablet (40 mg total) by mouth 2 (two) times daily.   60 tablet   12   . levETIRAcetam (KEPPRA) 250 MG tablet   Oral   Take 250 mg by mouth 2 (two) times daily.           . magnesium oxide (MAG-OX) 400 MG tablet   Oral   Take 400 mg by mouth 2 (two) times daily.         . memantine (NAMENDA) 10 MG tablet   Oral   Take 10 mg by mouth 2 (two) times daily.         . metoprolol (LOPRESSOR) 50 MG tablet   Oral   Take 50 mg by mouth every 12 (twelve) hours.         . potassium chloride (K-DUR,KLOR-CON) 10 MEQ tablet   Oral   Take 10 mEq by mouth every other day.         . rivastigmine (EXELON) 4.6 mg/24hr   Transdermal  Place 1 patch onto the skin daily.         Marland Kitchen venlafaxine (EFFEXOR-XR) 37.5 MG 24 hr capsule   Oral   Take 37.5 mg by mouth daily.           Marland Kitchen albuterol (PROVENTIL HFA;VENTOLIN HFA) 108 (90 BASE) MCG/ACT inhaler   Inhalation   Inhale 2 puffs into the lungs every 6 (six) hours as needed for wheezing.   1 Inhaler   2   . alum & mag hydroxide-simeth (MAALOX/MYLANTA) 200-200-20 MG/5ML suspension   Oral   Take 10 mLs by mouth every 4 (four) hours as needed. For indigestion           BP 137/63  Pulse 68  Temp(Src) 98.2 F (36.8 C) (Oral)  Resp 18  Wt 128 lb (58.06 kg)  BMI 22.68 kg/m2  SpO2 93%  Physical Exam  Nursing note and vitals reviewed. Constitutional: She is oriented to person, place, and time. She appears well-developed and well-nourished.  HENT:  Head: Normocephalic and atraumatic.  Eyes: Conjunctivae and EOM are normal. Pupils are equal, round, and reactive to light.  Left pupil approximately 3 mm,   right pupil 2 mm  Neck: Normal range of motion. Neck supple.  Cardiovascular: Normal rate, regular rhythm and normal heart sounds.   Pulmonary/Chest: Effort normal and breath sounds normal.  Abdominal: Soft. Bowel sounds are normal.  Musculoskeletal: Normal range of motion.  Neurological:  She is alert and oriented to person, place, and time.  Skin: Skin is warm and dry.  Psychiatric: She has a normal mood and affect.    ED Course  Procedures (including critical care time)  Labs Reviewed - No data to display No results found.   1. Pupil asymmetry       MDM  Patient is in no acute distress. No neurological deficits.  Screening history and physical reveals no obvious pathology        Donnetta Hutching, MD 06/01/12 [phone]

## 2013-03-20 ENCOUNTER — Encounter (HOSPITAL_COMMUNITY): Payer: Self-pay | Admitting: Emergency Medicine

## 2013-03-20 ENCOUNTER — Emergency Department (HOSPITAL_COMMUNITY)
Admission: EM | Admit: 2013-03-20 | Discharge: 2013-03-20 | Disposition: A | Discharge status: Home / Self Care | Payer: Medicare Other | Attending: Emergency Medicine | Admitting: Emergency Medicine

## 2013-03-20 ENCOUNTER — Emergency Department (HOSPITAL_COMMUNITY): Payer: Medicare Other

## 2013-03-20 DIAGNOSIS — Z87891 Personal history of nicotine dependence: Secondary | ICD-10-CM | POA: Insufficient documentation

## 2013-03-20 DIAGNOSIS — F411 Generalized anxiety disorder: Secondary | ICD-10-CM | POA: Insufficient documentation

## 2013-03-20 DIAGNOSIS — I509 Heart failure, unspecified: Secondary | ICD-10-CM | POA: Insufficient documentation

## 2013-03-20 DIAGNOSIS — F3289 Other specified depressive episodes: Secondary | ICD-10-CM | POA: Insufficient documentation

## 2013-03-20 DIAGNOSIS — E119 Type 2 diabetes mellitus without complications: Secondary | ICD-10-CM | POA: Insufficient documentation

## 2013-03-20 DIAGNOSIS — Z8739 Personal history of other diseases of the musculoskeletal system and connective tissue: Secondary | ICD-10-CM | POA: Insufficient documentation

## 2013-03-20 DIAGNOSIS — F329 Major depressive disorder, single episode, unspecified: Secondary | ICD-10-CM | POA: Insufficient documentation

## 2013-03-20 DIAGNOSIS — J441 Chronic obstructive pulmonary disease with (acute) exacerbation: Secondary | ICD-10-CM | POA: Insufficient documentation

## 2013-03-20 DIAGNOSIS — Z79899 Other long term (current) drug therapy: Secondary | ICD-10-CM | POA: Insufficient documentation

## 2013-03-20 DIAGNOSIS — I10 Essential (primary) hypertension: Secondary | ICD-10-CM | POA: Insufficient documentation

## 2013-03-20 LAB — COMPREHENSIVE METABOLIC PANEL
ALK PHOS: 121 U/L — AB (ref 39–117)
ALT: 14 U/L (ref 0–35)
AST: 18 U/L (ref 0–37)
Albumin: 3.1 g/dL — ABNORMAL LOW (ref 3.5–5.2)
BILIRUBIN TOTAL: 0.2 mg/dL — AB (ref 0.3–1.2)
BUN: 27 mg/dL — AB (ref 6–23)
CHLORIDE: 100 meq/L (ref 96–112)
CO2: 26 mEq/L (ref 19–32)
CREATININE: 1.39 mg/dL — AB (ref 0.50–1.10)
Calcium: 9.2 mg/dL (ref 8.4–10.5)
GFR calc non Af Amer: 34 mL/min — ABNORMAL LOW (ref >90)
GFR, EST AFRICAN AMERICAN: 39 mL/min — AB (ref >90)
GLUCOSE: 146 mg/dL — AB (ref 70–99)
POTASSIUM: 4.1 meq/L (ref 3.7–5.3)
Sodium: 137 mEq/L (ref 137–147)
Total Protein: 7.1 g/dL (ref 6.0–8.3)

## 2013-03-20 LAB — CBC WITH DIFFERENTIAL/PLATELET
BASOS PCT: 0 % (ref 0–1)
Basophils Absolute: 0 10*3/uL (ref 0.0–0.1)
EOS ABS: 0.4 10*3/uL (ref 0.0–0.7)
Eosinophils Relative: 4 % (ref 0–5)
HEMATOCRIT: 39 % (ref 36.0–46.0)
HEMOGLOBIN: 12.8 g/dL (ref 12.0–15.0)
LYMPHS ABS: 1.5 10*3/uL (ref 0.7–4.0)
Lymphocytes Relative: 18 % (ref 12–46)
MCH: 27.6 pg (ref 26.0–34.0)
MCHC: 32.8 g/dL (ref 30.0–36.0)
MCV: 84.2 fL (ref 78.0–100.0)
MONO ABS: 0.6 10*3/uL (ref 0.1–1.0)
MONOS PCT: 7 % (ref 3–12)
NEUTROS ABS: 5.9 10*3/uL (ref 1.7–7.7)
NEUTROS PCT: 71 % (ref 43–77)
Platelets: 230 10*3/uL (ref 150–400)
RBC: 4.63 MIL/uL (ref 3.87–5.11)
RDW: 14.3 % (ref 11.5–15.5)
WBC: 8.3 10*3/uL (ref 4.0–10.5)

## 2013-03-20 LAB — PRO B NATRIURETIC PEPTIDE: PRO B NATRI PEPTIDE: 1427 pg/mL — AB (ref 0–450)

## 2013-03-20 MED ORDER — AMOXICILLIN-POT CLAVULANATE 875-125 MG PO TABS: 1.0000 | ORAL_TABLET | Freq: Two times a day (BID) | ORAL | Status: DC

## 2013-03-20 MED ORDER — PREDNISONE 20 MG PO TABS: 40.0000 mg | ORAL_TABLET | Freq: Every day | ORAL | Status: AC

## 2013-03-20 MED ORDER — AMOXICILLIN-POT CLAVULANATE 875-125 MG PO TABS
1.0000 | ORAL_TABLET | Freq: Once | ORAL | Status: AC
  Administered 2013-03-20: 1 via ORAL
  Filled 2013-03-20: qty 1

## 2013-03-20 MED ORDER — PREDNISONE 50 MG PO TABS
60.0000 mg | ORAL_TABLET | ORAL | Status: AC
  Administered 2013-03-20: 60 mg via ORAL
  Filled 2013-03-20 (×2): qty 1

## 2013-03-20 NOTE — ED Provider Notes (Signed)
CSN: [id number]     Arrival date & time 03/20/13  2106 History  This chart was scribed for Gerhard Munch, MD by Bennett Scrape, ED Scribe. This patient was seen in room APA16A/APA16A and the patient's care was started at 9:15 PM.   Chief Complaint  Patient presents with  . Shortness of Breath     The history is provided by the patient. No language interpreter was used.    HPI Comments: Melissa [name] is a 80 y.o. female with a h/o COPD and CHF brought in by ambulance from [facility], who presents to the Emergency Department complaining of cough that has been getting progressively worse since its onset one week ago. She reports associated SOB. Pt states that tonight while getting ready for bed she was unable to breathe causing her enough concern to call EMS. She reports prior episodes of similar cough and SOB due to allergies. She denies any fever or CP.  Past Medical History  Diagnosis Date  . COPD (chronic obstructive pulmonary disease)   . CHF (congestive heart failure)   . Delirium   . Metabolic alkalosis   . Hypertension   . Diabetes mellitus   . Anxiety   . Depression   . Panic attacks   . Chronic diarrhea of unknown origin   . Gout   . Arthritis    Past Surgical History  Procedure Laterality Date  . Inguinal hernia repair      right   No family history on file. History  Substance Use Topics  . Smoking status: Former Games developer  . Smokeless tobacco: Not on file  . Alcohol Use: No   No OB history provided.  Review of Systems  Constitutional:       Per HPI, otherwise negative  HENT:       Per HPI, otherwise negative  Respiratory:       Per HPI, otherwise negative  Cardiovascular:       Per HPI, otherwise negative  Gastrointestinal: Negative for vomiting.  Endocrine:       Negative aside from HPI  Genitourinary:       Neg aside from HPI   Musculoskeletal:       Per HPI, otherwise negative  Skin: Negative.   Neurological: Negative for syncope.     Allergies  Shellfish allergy  Home Medications   Current Outpatient Rx  Name  Route  Sig  Dispense  Refill  . EXPIRED: albuterol (PROVENTIL HFA;VENTOLIN HFA) 108 (90 BASE) MCG/ACT inhaler   Inhalation   Inhale 2 puffs into the lungs every 6 (six) hours as needed for wheezing.   1 Inhaler   2   . alum & mag hydroxide-simeth (MAALOX/MYLANTA) 200-200-20 MG/5ML suspension   Oral   Take 10 mLs by mouth every 4 (four) hours as needed. For indigestion         . amLODipine (NORVASC) 10 MG tablet   Oral   Take 10 mg by mouth daily.           Marland Kitchen EXPIRED: carvedilol (COREG) 12.5 MG tablet   Oral   Take 1 tablet (12.5 mg total) by mouth 2 (two) times daily with a meal.   60 tablet   12   . diphenoxylate-atropine (LOMOTIL) 2.5-0.025 MG per tablet   Oral   Take 1 tablet by mouth 4 (four) times daily as needed. For diarrhea         . escitalopram (LEXAPRO) 10 MG tablet   Oral   Take  10 mg by mouth daily.           Marland Kitchen. levETIRAcetam (KEPPRA) 250 MG tablet   Oral   Take 250 mg by mouth 2 (two) times daily.           . magnesium oxide (MAG-OX) 400 MG tablet   Oral   Take 400 mg by mouth 2 (two) times daily.         . memantine (NAMENDA) 10 MG tablet   Oral   Take 10 mg by mouth 2 (two) times daily.         . metoprolol (LOPRESSOR) 50 MG tablet   Oral   Take 50 mg by mouth every 12 (twelve) hours.         . potassium chloride (K-DUR,KLOR-CON) 10 MEQ tablet   Oral   Take 10 mEq by mouth every other day.         . rivastigmine (EXELON) 4.6 mg/24hr   Transdermal   Place 1 patch onto the skin daily.         Marland Kitchen. venlafaxine (EFFEXOR-XR) 37.5 MG 24 hr capsule   Oral   Take 37.5 mg by mouth daily.            Triage Vitals: BP 130/50  Pulse 69  Temp(Src) 97.8 F (36.6 C) (Oral)  Resp 22  SpO2 91%  Physical Exam  Nursing note and vitals reviewed. Constitutional: She is oriented to person, place, and time. She appears well-developed and  well-nourished. No distress.  HENT:  Head: Normocephalic and atraumatic.  Eyes: Conjunctivae and EOM are normal.  Cardiovascular: Normal rate and regular rhythm.   Pulmonary/Chest: Effort normal and breath sounds normal. No stridor. No respiratory distress.  Abdominal: Soft. She exhibits no distension.  Musculoskeletal: Normal range of motion. She exhibits no edema.  Neurological: She is alert and oriented to person, place, and time. No cranial nerve deficit.  Skin: Skin is warm and dry.  Psychiatric: Her mood appears anxious.    ED Course  Procedures (including critical care time)  DIAGNOSTIC STUDIES: Oxygen Saturation is 91% on RA, adequate by my interpretation.    COORDINATION OF CARE: 9:20 PM-Discussed treatment plan which includes CXR, CBC panel, CMP and UA with pt at bedside and pt agreed to plan.   11:13 PM I discussed all findings with the patient, her son and daughter in law. She is in no distress.   Labs Review Labs Reviewed  COMPREHENSIVE METABOLIC PANEL - Abnormal; Notable for the following:    Glucose, Bld 146 (*)    BUN 27 (*)    Creatinine, Ser 1.39 (*)    Albumin 3.1 (*)    Alkaline Phosphatase 121 (*)    Total Bilirubin 0.2 (*)    GFR calc non Af Amer 34 (*)    GFR calc Af Amer 39 (*)    All other components within normal limits  PRO B NATRIURETIC PEPTIDE - Abnormal; Notable for the following:    Pro B Natriuretic peptide (BNP) 1427.0 (*)    All other components within normal limits  CBC WITH DIFFERENTIAL  URINALYSIS, ROUTINE W REFLEX MICROSCOPIC   Imaging Review Dg Chest 2 View  03/20/2013   CLINICAL DATA:  Cough, shortness of breath.  EXAM: CHEST  2 VIEW  COMPARISON:  Prior radiograph from 06/18/2011  FINDINGS: The cardiac and mediastinal silhouettes are stable in size and contour, and remain within normal limits.  The lungs are normally inflated. Diffuse bronchitic changes are present, likely related  to history of underlying COPD. No airspace  consolidation, pulmonary edema, or pneumothorax. Minimal blunting of the left costophrenic angle may represent a small left pleural effusion pars is chronic pleural reaction/scarring.  No acute osseous abnormality identified.  IMPRESSION: 1. COPD.  No focal infiltrate or pulmonary edema identified. 2. Mild blunting of the left costophrenic angle, which may reflect chronic pleural reaction/scarring versus small pleural effusion.   Electronically Signed   By: Rise Mu M.D.   On: 03/20/2013 22:23     EKG Interpretation   Date/Time:  Sunday March 20 2013 21:31:35 EDT Ventricular Rate:  67 PR Interval:  178 QRS Duration: 78 QT Interval:  450 QTC Calculation: 475 R Axis:   -21 Text Interpretation:  Normal sinus rhythm Abnormal QRS-T angle, consider  primary T wave abnormality Abnormal ECG When compared with ECG of  15-Jun-2011 09:38, T wave inversion now evident in Lateral leads Sinus  rhythm T wave abnormality (minimal changes in aVL and V6 ) Artifact  Abnormal ekg Confirmed by Gerhard Munch  MD [phone]) on 03/20/2013 9:53:32  PM      MDM   Final diagnoses:  COPD exacerbation    This elderly female dementia presents from her nursing facility with one week of cough, mild dyspnea, no substantial chest pain. Patient has multiple medical problems, including dementia and COPD and CHF.  Patient's evaluation here was largely reassuring, and she was afebrile, no hypoxia on exam.  With these findings, her history, treatment was started empirically for presumed COPD exacerbation. Patient is not fluid overloaded, has no pain, nonischemic findings on EKG, atypical ACS seems less likely. Patient is returning to a monitored facility.  Seems stable for discharge.   Gerhard Munch, MD 03/20/13 [phone]

## 2013-03-20 NOTE — Discharge Instructions (Signed)
As discussed, your evaluation today has been largely reassuring.    Your difficulty breathing and cough are likely due to an exacerbation of your COPD.  It is important that you monitor your condition carefully, and do not hesitate to return to the ED if you develop new, or concerning changes in your condition.  Otherwise, please follow-up with your physician for appropriate ongoing care.

## 2013-03-20 NOTE — ED Notes (Signed)
Patient presents to ED via [facility] from [facility] with c/o shortness of breath that started today and cough x 3-4 days.

## 2013-05-19 ENCOUNTER — Ambulatory Visit (INDEPENDENT_AMBULATORY_CARE_PROVIDER_SITE_OTHER): Payer: Medicare Other | Admitting: Otolaryngology

## 2013-05-19 DIAGNOSIS — H903 Sensorineural hearing loss, bilateral: Secondary | ICD-10-CM

## 2013-06-15 ENCOUNTER — Emergency Department (HOSPITAL_COMMUNITY): Payer: Medicare Other

## 2013-06-15 ENCOUNTER — Emergency Department (HOSPITAL_COMMUNITY)
Admission: EM | Admit: 2013-06-15 | Discharge: 2013-06-15 | Disposition: A | Discharge status: Home / Self Care | Payer: Medicare Other | Attending: Emergency Medicine | Admitting: Emergency Medicine

## 2013-06-15 ENCOUNTER — Encounter (HOSPITAL_COMMUNITY): Payer: Self-pay | Admitting: Emergency Medicine

## 2013-06-15 DIAGNOSIS — Z792 Long term (current) use of antibiotics: Secondary | ICD-10-CM | POA: Insufficient documentation

## 2013-06-15 DIAGNOSIS — IMO0002 Reserved for concepts with insufficient information to code with codable children: Secondary | ICD-10-CM | POA: Insufficient documentation

## 2013-06-15 DIAGNOSIS — W1809XA Striking against other object with subsequent fall, initial encounter: Secondary | ICD-10-CM | POA: Insufficient documentation

## 2013-06-15 DIAGNOSIS — I509 Heart failure, unspecified: Secondary | ICD-10-CM | POA: Insufficient documentation

## 2013-06-15 DIAGNOSIS — Z79899 Other long term (current) drug therapy: Secondary | ICD-10-CM | POA: Insufficient documentation

## 2013-06-15 DIAGNOSIS — Y9210 Unspecified residential institution as the place of occurrence of the external cause: Secondary | ICD-10-CM | POA: Insufficient documentation

## 2013-06-15 DIAGNOSIS — Y9389 Activity, other specified: Secondary | ICD-10-CM | POA: Insufficient documentation

## 2013-06-15 DIAGNOSIS — F329 Major depressive disorder, single episode, unspecified: Secondary | ICD-10-CM | POA: Insufficient documentation

## 2013-06-15 DIAGNOSIS — F411 Generalized anxiety disorder: Secondary | ICD-10-CM | POA: Insufficient documentation

## 2013-06-15 DIAGNOSIS — I10 Essential (primary) hypertension: Secondary | ICD-10-CM | POA: Insufficient documentation

## 2013-06-15 DIAGNOSIS — E119 Type 2 diabetes mellitus without complications: Secondary | ICD-10-CM | POA: Insufficient documentation

## 2013-06-15 DIAGNOSIS — J449 Chronic obstructive pulmonary disease, unspecified: Secondary | ICD-10-CM | POA: Insufficient documentation

## 2013-06-15 DIAGNOSIS — S0990XA Unspecified injury of head, initial encounter: Secondary | ICD-10-CM | POA: Insufficient documentation

## 2013-06-15 DIAGNOSIS — Z87891 Personal history of nicotine dependence: Secondary | ICD-10-CM | POA: Insufficient documentation

## 2013-06-15 DIAGNOSIS — J4489 Other specified chronic obstructive pulmonary disease: Secondary | ICD-10-CM | POA: Insufficient documentation

## 2013-06-15 DIAGNOSIS — M129 Arthropathy, unspecified: Secondary | ICD-10-CM | POA: Insufficient documentation

## 2013-06-15 DIAGNOSIS — M549 Dorsalgia, unspecified: Secondary | ICD-10-CM

## 2013-06-15 DIAGNOSIS — W19XXXA Unspecified fall, initial encounter: Secondary | ICD-10-CM

## 2013-06-15 DIAGNOSIS — F3289 Other specified depressive episodes: Secondary | ICD-10-CM | POA: Insufficient documentation

## 2013-06-15 NOTE — ED Provider Notes (Signed)
CSN: [id number]     Arrival date & time 06/15/13  1553 History   This chart was scribed for Melissa Gaskinsonald W Wickline, MD by [place]az[place] Peace, ED Scribe. The patient was seen in APA09/APA09. The patient's care was started at 4:52 PM.    Chief Complaint  Patient presents with  . Fall     (Consider location/radiation/quality/duration/timing/severity/associated sxs/prior Treatment) Patient is a 80 y.o. female presenting with fall. The history is provided by the patient (the pt fell and hit her back and head,.  no loc). No language interpreter was used.  Fall    Past Medical History  Diagnosis Date  . COPD (chronic obstructive pulmonary disease)   . CHF (congestive heart failure)   . Delirium   . Metabolic alkalosis   . Hypertension   . Diabetes mellitus   . Anxiety   . Depression   . Panic attacks   . Chronic diarrhea of unknown origin   . Gout   . Arthritis    Past Surgical History  Procedure Laterality Date  . Inguinal hernia repair      right   History reviewed. No pertinent family history. History  Substance Use Topics  . Smoking status: Former Games developermoker  . Smokeless tobacco: Not on file  . Alcohol Use: No   OB History   Grav Para Term Preterm Abortions TAB SAB Ect Mult Living                 Review of Systems    Allergies  Shellfish allergy  Home Medications   Prior to Admission medications   Medication Sig Start Date End Date Taking? Authorizing Provider  alum & mag hydroxide-simeth (MAALOX/MYLANTA) 200-200-20 MG/5ML suspension Take 10 mLs by mouth every 4 (four) hours as needed. For indigestion    Historical Provider, MD  amLODipine (NORVASC) 10 MG tablet Take 10 mg by mouth daily.      Historical Provider, MD  amoxicillin-clavulanate (AUGMENTIN) 875-125 MG per tablet Take 1 tablet by mouth 2 (two) times daily. 03/20/13   Gerhard Munchobert Lockwood, MD  benzonatate (TESSALON) 200 MG capsule Take 200 mg by mouth 3 (three) times daily.    Historical Provider, MD  carvedilol  (COREG) 12.5 MG tablet Take 12.5 mg by mouth 2 (two) times daily.    Historical Provider, MD  diphenoxylate-atropine (LOMOTIL) 2.5-0.025 MG per tablet Take 1 tablet by mouth 4 (four) times daily as needed. For diarrhea    Historical Provider, MD  escitalopram (LEXAPRO) 20 MG tablet Take 20 mg by mouth daily.    Historical Provider, MD  levETIRAcetam (KEPPRA) 250 MG tablet Take 250 mg by mouth 2 (two) times daily.      Historical Provider, MD  magnesium oxide (MAG-OX) 400 MG tablet Take 400 mg by mouth 2 (two) times daily.    Historical Provider, MD  Memantine HCl ER (NAMENDA XR) 28 MG CP24 Take 28 mg by mouth daily.    Historical Provider, MD  metoprolol (LOPRESSOR) 50 MG tablet Take 50 mg by mouth every 12 (twelve) hours.    Historical Provider, MD  pantoprazole (PROTONIX) 40 MG tablet Take 40 mg by mouth daily.    Historical Provider, MD  potassium chloride (K-DUR,KLOR-CON) 10 MEQ tablet Take 10 mEq by mouth every other day.    Historical Provider, MD  rivastigmine (EXELON) 9.5 mg/24hr Place 9.5 mg onto the skin daily.    Historical Provider, MD  simethicone (MYLICON) 80 MG chewable tablet Chew 80 mg by mouth 3 (three) times daily.  Historical Provider, MD  torsemide (DEMADEX) 10 MG tablet Take 5 mg by mouth 2 (two) times daily.    Historical Provider, MD  venlafaxine (EFFEXOR-XR) 37.5 MG 24 hr capsule Take 37.5 mg by mouth daily.      Historical Provider, MD   BP 132/53  Pulse 70  Temp(Src) 98 F (36.7 C) (Oral)  Resp 20  Ht 5\' 3"  (1.6 m)  Wt 141 lb (63.957 kg)  BMI 24.98 kg/m2  SpO2 91% Physical Exam CONSTITUTIONAL: Well developed/well nourished HEAD: Normocephalic/atraumatic EYES: EOMI/PERRL ENMT: Mucous membranes moist NECK: supple no meningeal signs SPINE:entire spine nontender CV: S1/S2 noted, no murmurs/rubs/gallops noted LUNGS: Lungs are clear to auscultation bilaterally, no apparent distress ABDOMEN: soft, nontender, no rebound or guarding GU:no cva tenderness NEURO: Pt  is awake/alert, moves all extremitiesx4 EXTREMITIES: pulses normal, full ROM SKIN: warm, color normal PSYCH: no abnormalities of mood noted  ED Course  Procedures (including critical care time) DIAGNOSTIC STUDIES: .    COORDINATION OF CARE: 4:52 PM- Treatment plan was discussed with patient who verbalizes understanding and agrees.     Labs Review Labs Reviewed - No data to display  Imaging Review No results found.   EKG Interpretation None     Medications - No data to display  MDM   Final diagnoses:  None   The chart was scribed for me under my direct supervision.  I personally performed the history, physical, and medical decision making and all procedures in the evaluation of this patient.Benny Lennert, MD 06/16/13 2022

## 2013-06-15 NOTE — ED Notes (Signed)
Pt denies taking any blood thinners.

## 2013-06-15 NOTE — ED Notes (Signed)
Report given to P. Watkins 

## 2013-06-15 NOTE — ED Notes (Signed)
Pt states she slid off a stool at [facility] facility today, according to pt the back of head on floor, pt denies pain

## 2013-06-15 NOTE — Discharge Instructions (Signed)
folllow up with your md as needed

## 2013-06-22 ENCOUNTER — Emergency Department (HOSPITAL_COMMUNITY): Payer: Medicare Other

## 2013-06-22 ENCOUNTER — Encounter (HOSPITAL_COMMUNITY): Payer: Self-pay | Admitting: Emergency Medicine

## 2013-06-22 ENCOUNTER — Inpatient Hospital Stay (HOSPITAL_COMMUNITY)
Admission: EM | Admit: 2013-06-22 | Discharge: 2013-06-26 | DRG: 190 | Disposition: A | Discharge status: Skilled Nursing Facility | Payer: Medicare Other | Attending: Pulmonary Disease | Admitting: Pulmonary Disease

## 2013-06-22 DIAGNOSIS — F329 Major depressive disorder, single episode, unspecified: Secondary | ICD-10-CM | POA: Diagnosis present

## 2013-06-22 DIAGNOSIS — E119 Type 2 diabetes mellitus without complications: Secondary | ICD-10-CM | POA: Diagnosis present

## 2013-06-22 DIAGNOSIS — I5022 Chronic systolic (congestive) heart failure: Secondary | ICD-10-CM | POA: Diagnosis present

## 2013-06-22 DIAGNOSIS — Z9181 History of falling: Secondary | ICD-10-CM

## 2013-06-22 DIAGNOSIS — F0390 Unspecified dementia without behavioral disturbance: Secondary | ICD-10-CM | POA: Diagnosis present

## 2013-06-22 DIAGNOSIS — T502X5A Adverse effect of carbonic-anhydrase inhibitors, benzothiadiazides and other diuretics, initial encounter: Secondary | ICD-10-CM | POA: Diagnosis present

## 2013-06-22 DIAGNOSIS — Z79899 Other long term (current) drug therapy: Secondary | ICD-10-CM

## 2013-06-22 DIAGNOSIS — J441 Chronic obstructive pulmonary disease with (acute) exacerbation: Secondary | ICD-10-CM | POA: Diagnosis present

## 2013-06-22 DIAGNOSIS — E876 Hypokalemia: Secondary | ICD-10-CM | POA: Diagnosis present

## 2013-06-22 DIAGNOSIS — I129 Hypertensive chronic kidney disease with stage 1 through stage 4 chronic kidney disease, or unspecified chronic kidney disease: Secondary | ICD-10-CM | POA: Diagnosis present

## 2013-06-22 DIAGNOSIS — N390 Urinary tract infection, site not specified: Secondary | ICD-10-CM

## 2013-06-22 DIAGNOSIS — Z87891 Personal history of nicotine dependence: Secondary | ICD-10-CM

## 2013-06-22 DIAGNOSIS — N189 Chronic kidney disease, unspecified: Secondary | ICD-10-CM

## 2013-06-22 DIAGNOSIS — J9600 Acute respiratory failure, unspecified whether with hypoxia or hypercapnia: Secondary | ICD-10-CM | POA: Diagnosis present

## 2013-06-22 DIAGNOSIS — I509 Heart failure, unspecified: Secondary | ICD-10-CM | POA: Diagnosis present

## 2013-06-22 DIAGNOSIS — F410 Panic disorder [episodic paroxysmal anxiety] without agoraphobia: Secondary | ICD-10-CM | POA: Diagnosis present

## 2013-06-22 DIAGNOSIS — F3289 Other specified depressive episodes: Secondary | ICD-10-CM | POA: Diagnosis present

## 2013-06-22 DIAGNOSIS — K219 Gastro-esophageal reflux disease without esophagitis: Secondary | ICD-10-CM | POA: Diagnosis present

## 2013-06-22 DIAGNOSIS — J449 Chronic obstructive pulmonary disease, unspecified: Secondary | ICD-10-CM

## 2013-06-22 DIAGNOSIS — N184 Chronic kidney disease, stage 4 (severe): Secondary | ICD-10-CM | POA: Diagnosis present

## 2013-06-22 LAB — CBC WITH DIFFERENTIAL/PLATELET
BASOS PCT: 0 % (ref 0–1)
Basophils Absolute: 0 10*3/uL (ref 0.0–0.1)
Eosinophils Absolute: 0.1 10*3/uL (ref 0.0–0.7)
Eosinophils Relative: 2 % (ref 0–5)
HEMATOCRIT: 41.5 % (ref 36.0–46.0)
Hemoglobin: 13.9 g/dL (ref 12.0–15.0)
LYMPHS PCT: 22 % (ref 12–46)
Lymphs Abs: 1.7 10*3/uL (ref 0.7–4.0)
MCH: 26.9 pg (ref 26.0–34.0)
MCHC: 33.5 g/dL (ref 30.0–36.0)
MCV: 80.3 fL (ref 78.0–100.0)
MONO ABS: 1 10*3/uL (ref 0.1–1.0)
MONOS PCT: 12 % (ref 3–12)
Neutro Abs: 5.1 10*3/uL (ref 1.7–7.7)
Neutrophils Relative %: 64 % (ref 43–77)
Platelets: 226 10*3/uL (ref 150–400)
RBC: 5.17 MIL/uL — ABNORMAL HIGH (ref 3.87–5.11)
RDW: 14.2 % (ref 11.5–15.5)
WBC: 7.9 10*3/uL (ref 4.0–10.5)

## 2013-06-22 LAB — COMPREHENSIVE METABOLIC PANEL
ALT: 14 U/L (ref 0–35)
AST: 22 U/L (ref 0–37)
Albumin: 3.6 g/dL (ref 3.5–5.2)
Alkaline Phosphatase: 133 U/L — ABNORMAL HIGH (ref 39–117)
BILIRUBIN TOTAL: 0.6 mg/dL (ref 0.3–1.2)
BUN: 38 mg/dL — ABNORMAL HIGH (ref 6–23)
CALCIUM: 8.7 mg/dL (ref 8.4–10.5)
CO2: 32 meq/L (ref 19–32)
CREATININE: 1.76 mg/dL — AB (ref 0.50–1.10)
Chloride: 86 mEq/L — ABNORMAL LOW (ref 96–112)
GFR calc Af Amer: 29 mL/min — ABNORMAL LOW (ref >90)
GFR, EST NON AFRICAN AMERICAN: 25 mL/min — AB (ref >90)
Glucose, Bld: 101 mg/dL — ABNORMAL HIGH (ref 70–99)
Potassium: 2.3 mEq/L — CL (ref 3.7–5.3)
Sodium: 134 mEq/L — ABNORMAL LOW (ref 137–147)
Total Protein: 7.3 g/dL (ref 6.0–8.3)

## 2013-06-22 LAB — URINALYSIS, ROUTINE W REFLEX MICROSCOPIC
Bilirubin Urine: NEGATIVE
Glucose, UA: NEGATIVE mg/dL
KETONES UR: NEGATIVE mg/dL
Nitrite: NEGATIVE
Specific Gravity, Urine: 1.01 (ref 1.005–1.030)
Urobilinogen, UA: 0.2 mg/dL (ref 0.0–1.0)
pH: 6.5 (ref 5.0–8.0)

## 2013-06-22 LAB — PRO B NATRIURETIC PEPTIDE: Pro B Natriuretic peptide (BNP): 1891 pg/mL — ABNORMAL HIGH (ref 0–450)

## 2013-06-22 LAB — URINE MICROSCOPIC-ADD ON

## 2013-06-22 LAB — TROPONIN I: Troponin I: <0.3 ng/mL (ref <0.30)

## 2013-06-22 MED ORDER — POTASSIUM CHLORIDE CRYS ER 10 MEQ PO TBCR: 10.0000 meq | EXTENDED_RELEASE_TABLET | ORAL | Status: DC

## 2013-06-22 MED ORDER — MEMANTINE HCL ER 28 MG PO CP24
28.0000 mg | ORAL_CAPSULE | Freq: Every day | ORAL | Status: DC
  Administered 2013-06-23 – 2013-06-26 (×4): 28 mg via ORAL
  Filled 2013-06-22 (×6): qty 28

## 2013-06-22 MED ORDER — SODIUM CHLORIDE 0.9 % IJ SOLN
3.0000 mL | Freq: Two times a day (BID) | INTRAMUSCULAR | Status: DC
  Administered 2013-06-22 – 2013-06-25 (×5): 3 mL via INTRAVENOUS

## 2013-06-22 MED ORDER — LEVETIRACETAM 250 MG PO TABS
250.0000 mg | ORAL_TABLET | Freq: Two times a day (BID) | ORAL | Status: DC
  Administered 2013-06-22 – 2013-06-23 (×2): 250 mg via ORAL
  Administered 2013-06-23: 23:00:00 via ORAL
  Administered 2013-06-24 – 2013-06-26 (×5): 250 mg via ORAL
  Filled 2013-06-22 (×10): qty 1

## 2013-06-22 MED ORDER — ACETAMINOPHEN 650 MG RE SUPP: 650.0000 mg | Freq: Four times a day (QID) | RECTAL | Status: DC | PRN

## 2013-06-22 MED ORDER — LEVOFLOXACIN IN D5W 500 MG/100ML IV SOLN
500.0000 mg | INTRAVENOUS | Status: DC
  Administered 2013-06-24: 500 mg via INTRAVENOUS
  Filled 2013-06-22 (×2): qty 100

## 2013-06-22 MED ORDER — HYDROCODONE-ACETAMINOPHEN 5-325 MG PO TABS
1.0000 | ORAL_TABLET | ORAL | Status: DC | PRN
  Administered 2013-06-23 (×2): 1 via ORAL
  Administered 2013-06-24: 2 via ORAL
  Administered 2013-06-25: 1 via ORAL
  Filled 2013-06-22: qty 2
  Filled 2013-06-22: qty 1
  Filled 2013-06-22: qty 2
  Filled 2013-06-22 (×2): qty 1

## 2013-06-22 MED ORDER — BENZONATATE 100 MG PO CAPS
200.0000 mg | ORAL_CAPSULE | Freq: Three times a day (TID) | ORAL | Status: DC
  Administered 2013-06-22 – 2013-06-26 (×12): 200 mg via ORAL
  Filled 2013-06-22 (×12): qty 2

## 2013-06-22 MED ORDER — MORPHINE SULFATE 2 MG/ML IJ SOLN
1.0000 mg | INTRAMUSCULAR | Status: DC | PRN
  Filled 2013-06-22: qty 1

## 2013-06-22 MED ORDER — IPRATROPIUM-ALBUTEROL 0.5-2.5 (3) MG/3ML IN SOLN
3.0000 mL | RESPIRATORY_TRACT | Status: DC
  Administered 2013-06-22 – 2013-06-26 (×14): 3 mL via RESPIRATORY_TRACT
  Filled 2013-06-22 (×15): qty 3

## 2013-06-22 MED ORDER — METOPROLOL TARTRATE 50 MG PO TABS
50.0000 mg | ORAL_TABLET | Freq: Two times a day (BID) | ORAL | Status: DC
  Filled 2013-06-22: qty 1

## 2013-06-22 MED ORDER — VENLAFAXINE HCL ER 37.5 MG PO CP24
37.5000 mg | ORAL_CAPSULE | Freq: Every day | ORAL | Status: DC
  Administered 2013-06-23 – 2013-06-26 (×4): 37.5 mg via ORAL
  Filled 2013-06-22 (×6): qty 1

## 2013-06-22 MED ORDER — AMLODIPINE BESYLATE 5 MG PO TABS
10.0000 mg | ORAL_TABLET | Freq: Every day | ORAL | Status: DC
  Administered 2013-06-23 – 2013-06-26 (×4): 10 mg via ORAL
  Filled 2013-06-22 (×4): qty 2

## 2013-06-22 MED ORDER — DIPHENOXYLATE-ATROPINE 2.5-0.025 MG PO TABS: 1.0000 | ORAL_TABLET | Freq: Four times a day (QID) | ORAL | Status: DC | PRN

## 2013-06-22 MED ORDER — IPRATROPIUM BROMIDE 0.02 % IN SOLN: 0.5000 mg | Freq: Once | RESPIRATORY_TRACT | Status: DC

## 2013-06-22 MED ORDER — MAGNESIUM OXIDE 400 (241.3 MG) MG PO TABS
400.0000 mg | ORAL_TABLET | Freq: Two times a day (BID) | ORAL | Status: DC
  Administered 2013-06-22 – 2013-06-26 (×8): 400 mg via ORAL
  Filled 2013-06-22 (×9): qty 1

## 2013-06-22 MED ORDER — ALBUTEROL SULFATE (2.5 MG/3ML) 0.083% IN NEBU: 5.0000 mg | INHALATION_SOLUTION | Freq: Once | RESPIRATORY_TRACT | Status: DC

## 2013-06-22 MED ORDER — ESCITALOPRAM OXALATE 10 MG PO TABS
20.0000 mg | ORAL_TABLET | Freq: Every day | ORAL | Status: DC
  Administered 2013-06-23 – 2013-06-26 (×4): 20 mg via ORAL
  Filled 2013-06-22 (×4): qty 2

## 2013-06-22 MED ORDER — ENOXAPARIN SODIUM 30 MG/0.3ML ~~LOC~~ SOLN
30.0000 mg | SUBCUTANEOUS | Status: DC
  Administered 2013-06-22 – 2013-06-25 (×4): 30 mg via SUBCUTANEOUS
  Filled 2013-06-22 (×4): qty 0.3

## 2013-06-22 MED ORDER — ALUM & MAG HYDROXIDE-SIMETH 200-200-20 MG/5ML PO SUSP: 10.0000 mL | ORAL | Status: DC | PRN

## 2013-06-22 MED ORDER — POTASSIUM CHLORIDE 10 MEQ/100ML IV SOLN
10.0000 meq | Freq: Once | INTRAVENOUS | Status: AC
  Administered 2013-06-22: 10 meq via INTRAVENOUS
  Filled 2013-06-22: qty 100

## 2013-06-22 MED ORDER — SIMETHICONE 80 MG PO CHEW
80.0000 mg | CHEWABLE_TABLET | Freq: Three times a day (TID) | ORAL | Status: DC
  Administered 2013-06-22 – 2013-06-26 (×12): 80 mg via ORAL
  Filled 2013-06-22 (×10): qty 1

## 2013-06-22 MED ORDER — ACETAMINOPHEN 500 MG PO TABS: 500.0000 mg | ORAL_TABLET | ORAL | Status: DC | PRN

## 2013-06-22 MED ORDER — IPRATROPIUM-ALBUTEROL 0.5-2.5 (3) MG/3ML IN SOLN
3.0000 mL | RESPIRATORY_TRACT | Status: DC
  Administered 2013-06-22: 3 mL via RESPIRATORY_TRACT
  Filled 2013-06-22: qty 3

## 2013-06-22 MED ORDER — ACETAMINOPHEN 325 MG PO TABS: 650.0000 mg | ORAL_TABLET | Freq: Four times a day (QID) | ORAL | Status: DC | PRN

## 2013-06-22 MED ORDER — POTASSIUM CHLORIDE CRYS ER 20 MEQ PO TBCR
40.0000 meq | EXTENDED_RELEASE_TABLET | Freq: Once | ORAL | Status: AC
  Administered 2013-06-22: 40 meq via ORAL
  Filled 2013-06-22: qty 2

## 2013-06-22 MED ORDER — LORAZEPAM 0.5 MG PO TABS
0.2500 mg | ORAL_TABLET | Freq: Once | ORAL | Status: AC
  Administered 2013-06-22: 0.25 mg via ORAL
  Filled 2013-06-22: qty 1

## 2013-06-22 MED ORDER — LEVOFLOXACIN IN D5W 750 MG/150ML IV SOLN
750.0000 mg | Freq: Once | INTRAVENOUS | Status: AC
  Administered 2013-06-22: 750 mg via INTRAVENOUS
  Filled 2013-06-22: qty 150

## 2013-06-22 MED ORDER — TORSEMIDE 10 MG PO TABS
5.0000 mg | ORAL_TABLET | Freq: Two times a day (BID) | ORAL | Status: DC
  Administered 2013-06-23 – 2013-06-26 (×7): 5 mg via ORAL
  Filled 2013-06-22 (×5): qty 0.5
  Filled 2013-06-22: qty 1
  Filled 2013-06-22: qty 0.5
  Filled 2013-06-22 (×2): qty 1

## 2013-06-22 MED ORDER — RIVASTIGMINE 9.5 MG/24HR TD PT24
9.5000 mg | MEDICATED_PATCH | Freq: Every day | TRANSDERMAL | Status: DC
  Administered 2013-06-23 – 2013-06-26 (×4): 9.5 mg via TRANSDERMAL
  Filled 2013-06-22 (×6): qty 1

## 2013-06-22 MED ORDER — IPRATROPIUM-ALBUTEROL 0.5-2.5 (3) MG/3ML IN SOLN
3.0000 mL | Freq: Once | RESPIRATORY_TRACT | Status: AC
Start: 2013-06-22 — End: 2013-06-22
  Administered 2013-06-22: 3 mL via RESPIRATORY_TRACT
  Filled 2013-06-22: qty 3

## 2013-06-22 MED ORDER — METHYLPREDNISOLONE SODIUM SUCC 125 MG IJ SOLR
60.0000 mg | Freq: Four times a day (QID) | INTRAMUSCULAR | Status: DC
  Administered 2013-06-22 – 2013-06-24 (×7): 60 mg via INTRAVENOUS
  Filled 2013-06-22 (×6): qty 2

## 2013-06-22 MED ORDER — METHYLPREDNISOLONE SODIUM SUCC 125 MG IJ SOLR
125.0000 mg | Freq: Once | INTRAMUSCULAR | Status: AC
  Administered 2013-06-22: 125 mg via INTRAVENOUS
  Filled 2013-06-22: qty 2

## 2013-06-22 MED ORDER — SODIUM CHLORIDE 0.9 % IV SOLN: 250.0000 mL | INTRAVENOUS | Status: DC | PRN

## 2013-06-22 MED ORDER — SODIUM CHLORIDE 0.9 % IJ SOLN: 3.0000 mL | INTRAMUSCULAR | Status: DC | PRN

## 2013-06-22 MED ORDER — ALBUTEROL SULFATE (2.5 MG/3ML) 0.083% IN NEBU
2.5000 mg | INHALATION_SOLUTION | Freq: Once | RESPIRATORY_TRACT | Status: AC
  Administered 2013-06-22: 2.5 mg via RESPIRATORY_TRACT
  Filled 2013-06-22: qty 3

## 2013-06-22 MED ORDER — SODIUM CHLORIDE 0.9 % IV SOLN
INTRAVENOUS | Status: DC
  Administered 2013-06-22: 12:00:00 via INTRAVENOUS

## 2013-06-22 MED ORDER — PANTOPRAZOLE SODIUM 40 MG PO TBEC
40.0000 mg | DELAYED_RELEASE_TABLET | Freq: Every day | ORAL | Status: DC
  Administered 2013-06-23 – 2013-06-26 (×4): 40 mg via ORAL
  Filled 2013-06-22 (×4): qty 1

## 2013-06-22 MED ORDER — CARVEDILOL 12.5 MG PO TABS
12.5000 mg | ORAL_TABLET | Freq: Two times a day (BID) | ORAL | Status: DC
  Administered 2013-06-22 – 2013-06-26 (×8): 12.5 mg via ORAL
  Filled 2013-06-22 (×9): qty 1

## 2013-06-22 NOTE — H&P (Signed)
[facility] History and Physical  Melissa [name] ZOX:[id number] DOB: [birth year] DOA: 06/22/2013  Referring physician:  PCP: Fredirick MaudlinHAWKINS,EDWARD L, MD  Specialists:   Chief Complaint: Generalized weakness and shortness of breath  HPI: Melissa MannersHelen H [name] is a 80 y.o. female  With a history of COPD, congestive heart failure, hypertension, diabetes mellitus that presented to the emergency department for generalized weakness and shortness of breath. Patient states her shortness of breath started approximately 3 days ago. Patient was brought in by EMS and was noted to have hypoxia, and was placed on 4 L of oxygen in the emergency department. Her saturations improved to 94%. Patient complains of feeling generally confused herself. Although she is able to answer all questions appropriately. She denies any dizziness or lightheadedness, chest pain, cough, headache, fever, abdominal pain nausea, vomiting or constipation. Patient denies any change in her bowel or urine habits. She denies any burning with urination.  Patient denies any sick contacts or recent travel.  Review of Systems:  Constitutional:  complaints of generalized weakness and fatigue. Denies any fever chills. HEENT: Denies photophobia, eye pain, redness, hearing loss, ear pain, congestion, sore throat, rhinorrhea, sneezing, mouth sores, trouble swallowing, neck pain, neck stiffness and tinnitus.   Respiratory: Complains of some shortness of breath. Denies any cough.  Cardiovascular: Denies chest pain, palpitations and leg swelling.  Gastrointestinal: Denies nausea, vomiting, abdominal pain, diarrhea, constipation, blood in stool and abdominal distention.  Genitourinary: Denies dysuria, urgency, frequency, hematuria, flank pain and difficulty urinating.  Musculoskeletal: Denies myalgias, back pain, joint swelling, arthralgias and gait problem.  Skin: Denies pallor, rash and wound.  Neurological: Complains of feeling confused to herself.    Hematological: Denies adenopathy. Easy bruising, personal or family bleeding history  Psychiatric/Behavioral: Denies suicidal ideation, mood changes, confusion, nervousness, sleep disturbance and agitation  Past Medical History  Diagnosis Date  . COPD (chronic obstructive pulmonary disease)   . CHF (congestive heart failure)   . Delirium   . Metabolic alkalosis   . Hypertension   . Diabetes mellitus   . Anxiety   . Depression   . Panic attacks   . Chronic diarrhea of unknown origin   . Gout   . Arthritis    Past Surgical History  Procedure Laterality Date  . Inguinal hernia repair      right   Social History:  reports that she has quit smoking. She does not have any smokeless tobacco history on file. She reports that she does not drink alcohol or use illicit drugs. [facility].  Allergies  Allergen Reactions  . Shellfish Allergy Diarrhea    Severe diarrhea and required hospitalization    No family history on file.   Prior to Admission medications   Medication Sig Start Date End Date Taking? Authorizing Provider  acetaminophen (TYLENOL) 500 MG tablet Take 500 mg by mouth every 4 (four) hours as needed for mild pain or fever.   Yes Historical Provider, MD  amLODipine (NORVASC) 10 MG tablet Take 10 mg by mouth daily.     Yes Historical Provider, MD  carvedilol (COREG) 12.5 MG tablet Take 12.5 mg by mouth 2 (two) times daily.   Yes Historical Provider, MD  escitalopram (LEXAPRO) 20 MG tablet Take 20 mg by mouth daily.   Yes Historical Provider, MD  levETIRAcetam (KEPPRA) 250 MG tablet Take 250 mg by mouth 2 (two) times daily.     Yes Historical Provider, MD  magnesium oxide (MAG-OX) 400 MG tablet Take 400 mg  by mouth 2 (two) times daily.   Yes Historical Provider, MD  Memantine HCl ER (NAMENDA XR) 28 MG CP24 Take 28 mg by mouth daily.   Yes Historical Provider, MD  metoprolol (LOPRESSOR) 50 MG tablet Take 50 mg by mouth every 12 (twelve) hours.   Yes  Historical Provider, MD  pantoprazole (PROTONIX) 40 MG tablet Take 40 mg by mouth daily.   Yes Historical Provider, MD  potassium chloride (K-DUR,KLOR-CON) 10 MEQ tablet Take 10 mEq by mouth every other day.   Yes Historical Provider, MD  rivastigmine (EXELON) 9.5 mg/24hr Place 9.5 mg onto the skin daily.   Yes Historical Provider, MD  simethicone (MYLICON) 80 MG chewable tablet Chew 80 mg by mouth 3 (three) times daily.   Yes Historical Provider, MD  torsemide (DEMADEX) 10 MG tablet Take 5 mg by mouth 2 (two) times daily.   Yes Historical Provider, MD  venlafaxine (EFFEXOR-XR) 37.5 MG 24 hr capsule Take 37.5 mg by mouth daily.     Yes Historical Provider, MD  alum & mag hydroxide-simeth (MAALOX/MYLANTA) 200-200-20 MG/5ML suspension Take 10 mLs by mouth every 4 (four) hours as needed. For indigestion    Historical Provider, MD  benzonatate (TESSALON) 200 MG capsule Take 200 mg by mouth 3 (three) times daily.    Historical Provider, MD  diphenoxylate-atropine (LOMOTIL) 2.5-0.025 MG per tablet Take 1 tablet by mouth 4 (four) times daily as needed. For diarrhea    Historical Provider, MD   Physical Exam: Filed Vitals:   06/22/13 1501  BP: 129/59  Pulse: 70  Temp:   Resp: 18     General: Well developed, well nourished, NAD, appears stated age  HEENT: NCAT, PERRLA, EOMI, Anicteic Sclera, mucous membranes moist.   Neck: Supple, no JVD, no masses  Cardiovascular: S1 S2 auscultated, no rubs, murmurs or gallops. Regular rate and rhythm.  Respiratory: Scattered rhonchi. Some expiratory wheezing noted. Able to complete sentences without hesitation.  Abdomen: Soft, nontender, nondistended, + bowel sounds  Extremities: warm dry without cyanosis clubbing or edema  Neuro: AAOx3, cranial nerves grossly intact. Strength 5/5 in patient's upper and lower extremities bilaterally  Skin: Without rashes exudates or nodules  Psych: Anxious affect and demeanor  Labs on Admission:  Basic Metabolic  Panel:  Recent Labs Lab 06/22/13 1144  NA 134*  K 2.3*  CL 86*  CO2 32  GLUCOSE 101*  BUN 38*  CREATININE 1.76*  CALCIUM 8.7   Liver Function Tests:  Recent Labs Lab 06/22/13 1144  AST 22  ALT 14  ALKPHOS 133*  BILITOT 0.6  PROT 7.3  ALBUMIN 3.6   No results found for this basename: LIPASE, AMYLASE,  in the last 168 hours No results found for this basename: AMMONIA,  in the last 168 hours CBC:  Recent Labs Lab 06/22/13 1144  WBC 7.9  NEUTROABS 5.1  HGB 13.9  HCT 41.5  MCV 80.3  PLT 226   Cardiac Enzymes:  Recent Labs Lab 06/22/13 1144  TROPONINI <0.30    BNP (last 3 results)  Recent Labs  03/20/13 2212 06/22/13 1144  PROBNP 1427.0* 1891.0*   CBG: No results found for this basename: GLUCAP,  in the last 168 hours  Radiological Exams on Admission: Dg Chest 2 View  06/22/2013   CLINICAL DATA:  Chest pain with history of COPD and CHF  EXAM: CHEST  2 VIEW  COMPARISON:  PA and lateral chest of March 20, 2013  FINDINGS: The lungs remain hyperinflated. There is stable blunting  of the left lateral and posterior costophrenic angles. The cardiac silhouette remains enlarged. The pulmonary vascularity is not engorged. There is calcification in the aortic arch and descending thoracic aorta. The bony thorax is unremarkable.  IMPRESSION: COPD and chronic cardiomegaly. There is no acute cardiopulmonary disease.   Electronically Signed   By: David  [place]   On: 06/22/2013 11:47    EKG: Independently reviewed. Sinus rhythm, rate 72, PVC  Assessment/Plan Active Problems:   Hypokalemia   COPD exacerbation  Acute hypoxic respiratory failure secondary to COPD exacerbation -Patient will be admitted to the medical floor -Patient had oxygen saturations of 86% upon arrival to the emergency department -Chest x-ray: COPD and chronic cardiomegaly. There is no acute cardiopulmonary disease -Will place patient on supplemental oxygen to maintain her saturations above 92%,  will place her on DuoNeb treatments along with Solu-Medrol, and Levaquin. -She is a patient of Dr. Juanetta Gosling   Hypokalemia -Likely secondary to diuretics -Will replace and continue to monitor  Asymptomatic urinary tract infection -UA: WBC 7-10, few bacteria, small leukocytes, negative nitrites -Patient be placed on Levaquin for COPD exacerbation which should also cover urinary tract infection -Pending culture  Chronic kidney disease, stage 3-4 -Creatinine does seem to be a baseline -Continue to monitor BMP  Chronic systolic congestive heart failure -Patient appears to be euvolemic at this time. -BNP 1891 -Will continue to monitor her daily weights, intake and output -Will continue Demadex, metoprolol, Coreg  Depression -Continue Lexapro, venlafaxine  Dementia -Continue Exelon, Namenda  GERD -Continue PPI  Hypertension -Currently controlled, continue amlodipine, metoprolol, Coreg, Demadex  Generalized weakness -Will obtain a TSH, vitamin D level, vitamin B 12, folate level. -Will also obtain PT evaluation  DVT prophylaxis: Lovenox  Code Status: Full  Condition: Guarded  Family Communication: None at bedside. Admission, patients condition and plan of care including tests being ordered have been discussed with the patient, who indicates understanding and agrees with the plan and Code Status.  Disposition Plan: Admitted.   Time spent: 60 minutes  MIKHAIL, MARYANN D.O. [facility] Pager [phone]  If 7PM-7AM, please contact night-coverage [email or web address] Password [facility] 06/22/2013, 3:15 PM

## 2013-06-22 NOTE — ED Notes (Signed)
Per EMS- [facility] reports pt has been increasingly confused since falling x 1 week ago. EMS reports pt O2 sats 87% (increased to 94% on 4l [place]) on ra upon arrival to assisted living facility and pt c/o increasing sob x 2-3 days.

## 2013-06-22 NOTE — ED Notes (Signed)
Potassium of 2.3 called to Dr. Freida BusmanAllen.

## 2013-06-22 NOTE — ED Notes (Signed)
Patient alert/oriented x 4. Slow with responses. Cyanosis noted to lips upon arrival to ED. Oxygen provided at 4 l [place]

## 2013-06-22 NOTE — Progress Notes (Signed)
ANTIBIOTIC CONSULT NOTE - INITIAL  Pharmacy Consult for Levaquin Indication: UTI & COPD exacerbation  Allergies  Allergen Reactions  . Shellfish Allergy Diarrhea    Severe diarrhea and required hospitalization    Patient Measurements: Height: 5\' 4"  (162.6 cm) Weight: 141 lb (63.957 kg) IBW/kg (Calculated) : 54.7 Adjusted Body Weight:   Vital Signs: Temp: 99.5 F (37.5 C) (06/17 1103) BP: 129/59 mmHg (06/17 1501) Pulse Rate: 70 (06/17 1501) Intake/Output from previous day:   Intake/Output from this shift: Total I/O In: -  Out: 13 [Urine:13]  Labs:  Recent Labs  06/22/13 1144  WBC 7.9  HGB 13.9  PLT 226  CREATININE 1.76*   Estimated Creatinine Clearance: 19.8 ml/min (by C-G formula based on Cr of 1.76). No results found for this basename: VANCOTROUGH, VANCOPEAK, VANCORANDOM, GENTTROUGH, GENTPEAK, GENTRANDOM, TOBRATROUGH, TOBRAPEAK, TOBRARND, AMIKACINPEAK, AMIKACINTROU, AMIKACIN,  in the last 72 hours   Microbiology: No results found for this or any previous visit (from the past 720 hour(s)).  Medical History: Past Medical History  Diagnosis Date  . COPD (chronic obstructive pulmonary disease)   . CHF (congestive heart failure)   . Delirium   . Metabolic alkalosis   . Hypertension   . Diabetes mellitus   . Anxiety   . Depression   . Panic attacks   . Chronic diarrhea of unknown origin   . Gout   . Arthritis     Medications:  Scheduled:  . amLODipine  10 mg Oral Daily  . benzonatate  200 mg Oral TID  . carvedilol  12.5 mg Oral BID  . enoxaparin (LOVENOX) injection  30 mg Subcutaneous Q24H  . escitalopram  20 mg Oral Daily  . ipratropium-albuterol  3 mL Nebulization Q4H  . levETIRAcetam  250 mg Oral BID  . [START ON 06/24/2013] levofloxacin (LEVAQUIN) IV  500 mg Intravenous Q48H  . levofloxacin (LEVAQUIN) IV  750 mg Intravenous Once  . magnesium oxide  400 mg Oral BID  . Memantine HCl ER  28 mg Oral Daily  . methylPREDNISolone (SOLU-MEDROL)  injection  60 mg Intravenous Q6H  . metoprolol  50 mg Oral Q12H  . pantoprazole  40 mg Oral Daily  . potassium chloride  10 mEq Oral QODAY  . rivastigmine  9.5 mg Transdermal Daily  . simethicone  80 mg Oral TID  . sodium chloride  3 mL Intravenous Q12H  . [START ON 06/23/2013] torsemide  5 mg Oral BID  . venlafaxine XR  37.5 mg Oral Daily   Assessment: Levaquin 750 mg IV given in ED Reduced renal function, CrCl 19.8 ml/min   Goal of Therapy:  Eradicate infection  Plan:  Levaquin 500 mg IV every 48 hours, next dose 06/24/2013 at 2 PM Monitor renal function Labs per protocol F/U Levaquin to po when appropriate Raquel JamesPittman, Person Bennett 06/22/2013,3:58 PM

## 2013-06-22 NOTE — ED Provider Notes (Signed)
CSN: [id number]     Arrival date & time 06/22/13  1059 History  This chart was scribed for Toy Baker, MD by Leona Carry, ED Scribe. The patient was seen in APA18/APA18. The patient's care was started at 11:02 AM.   Chief Complaint  Patient presents with  . Weakness    The history is provided by the patient. No language interpreter was used.   HPI Comments: Melissa [name] is a 80 y.o. female who presents to the Emergency Department from [facility] via EMS for evaluation of altered mental status. Per EMS personnel, patient was alert and oriented upon their arrival. She had an irregular pulse and trigeminal PVCs on their initial evaluation of her. EMS personnel also noted her O2 saturation as 87% on room air, so she was given 4 L of oxygen and her level improved to 94% prior to arrival. Patient's CBG en route was 118. Patient was seen here for a fall last week and had imaging scans that were all normal. She states that since the fall one week ago, she has had generalized weakness and has trouble complete her daily activities. She has also had shortness of breath for that past 3 days. She denies confusion at this time. She further denies dizziness, lightheadedness, cough, chest pain, HA, fever, vomiting, diarrhea or other symptoms. Patient reports that she has been eating normally. Her last bowel movement and urine void were both yesterday.   Past Medical History  Diagnosis Date  . COPD (chronic obstructive pulmonary disease)   . CHF (congestive heart failure)   . Delirium   . Metabolic alkalosis   . Hypertension   . Diabetes mellitus   . Anxiety   . Depression   . Panic attacks   . Chronic diarrhea of unknown origin   . Gout   . Arthritis    Past Surgical History  Procedure Laterality Date  . Inguinal hernia repair      right   No family history on file. History  Substance Use Topics  . Smoking status: Former Games developer  . Smokeless tobacco: Not on file  . Alcohol Use: No    OB History   Grav Para Term Preterm Abortions TAB SAB Ect Mult Living                 Review of Systems  Constitutional: Positive for fatigue (generalized weakness).  Respiratory: Positive for shortness of breath.   Cardiovascular: Negative for chest pain.  Gastrointestinal: Negative for vomiting, abdominal pain and diarrhea.  Neurological: Negative for dizziness, light-headedness and headaches.  Psychiatric/Behavioral: Negative for confusion.  All other systems reviewed and are negative.   Allergies  Shellfish allergy  Home Medications   Prior to Admission medications   Medication Sig Start Date End Date Taking? Authorizing Jerrica Thorman  alum & mag hydroxide-simeth (MAALOX/MYLANTA) 200-200-20 MG/5ML suspension Take 10 mLs by mouth every 4 (four) hours as needed. For indigestion    Historical Lynsey Ange, MD  amLODipine (NORVASC) 10 MG tablet Take 10 mg by mouth daily.      Historical Jamelle Noy, MD  benzonatate (TESSALON) 200 MG capsule Take 200 mg by mouth 3 (three) times daily.    Historical Stephani Janak, MD  carvedilol (COREG) 12.5 MG tablet Take 12.5 mg by mouth 2 (two) times daily.    Historical Kairyn Olmeda, MD  diphenoxylate-atropine (LOMOTIL) 2.5-0.025 MG per tablet Take 1 tablet by mouth 4 (four) times daily as needed. For diarrhea    Historical Huxley Vanwagoner, MD  escitalopram (LEXAPRO)  20 MG tablet Take 20 mg by mouth daily.    Historical Tomie Spizzirri, MD  levETIRAcetam (KEPPRA) 250 MG tablet Take 250 mg by mouth 2 (two) times daily.      Historical Marvena Tally, MD  magnesium oxide (MAG-OX) 400 MG tablet Take 400 mg by mouth 2 (two) times daily.    Historical Evaleigh Mccamy, MD  Memantine HCl ER (NAMENDA XR) 28 MG CP24 Take 28 mg by mouth daily.    Historical Ayah Cozzolino, MD  metoprolol (LOPRESSOR) 50 MG tablet Take 50 mg by mouth every 12 (twelve) hours.    Historical Suhayb Anzalone, MD  pantoprazole (PROTONIX) 40 MG tablet Take 40 mg by mouth daily.    Historical Remer Couse, MD  potassium chloride  (K-DUR,KLOR-CON) 10 MEQ tablet Take 10 mEq by mouth every other day.    Historical Kaylib Furness, MD  rivastigmine (EXELON) 9.5 mg/24hr Place 9.5 mg onto the skin daily.    Historical Darvis Croft, MD  simethicone (MYLICON) 80 MG chewable tablet Chew 80 mg by mouth 3 (three) times daily.    Historical Renwick Asman, MD  torsemide (DEMADEX) 10 MG tablet Take 5 mg by mouth 2 (two) times daily.    Historical Taia Bramlett, MD  venlafaxine (EFFEXOR-XR) 37.5 MG 24 hr capsule Take 37.5 mg by mouth daily.      Historical Tray Klayman, MD   Triage Vitals: BP 139/67  Pulse 72  Temp(Src) 99.5 F (37.5 C)  Resp 22  Ht 5\' 4"  (1.626 m)  Wt 141 lb (63.957 kg)  BMI 24.19 kg/m2  SpO2 94% Physical Exam  Nursing note and vitals reviewed. Constitutional: She is oriented to person, place, and time. She appears well-developed and well-nourished.  Non-toxic appearance. No distress.  HENT:  Head: Normocephalic and atraumatic.  Eyes: Conjunctivae, EOM and lids are normal. Pupils are equal, round, and reactive to light.  Neck: Normal range of motion. Neck supple. No tracheal deviation present. No mass present.  Cardiovascular: Normal rate, regular rhythm and normal heart sounds.  Exam reveals no gallop.   No murmur heard. Pulmonary/Chest: Effort normal. No stridor. No respiratory distress. She has no decreased breath sounds. She has no wheezes. She has rhonchi. She has no rales.  Rhonchi to bases bilaterally.  Abdominal: Soft. Normal appearance and bowel sounds are normal. She exhibits no distension. There is no tenderness. There is no rebound and no CVA tenderness.  Musculoskeletal: Normal range of motion. She exhibits no edema and no tenderness.  No signs of pedal edema.   Neurological: She is alert and oriented to person, place, and time. She has normal strength. No cranial nerve deficit or sensory deficit. GCS eye subscore is 4. GCS verbal subscore is 5. GCS motor subscore is 6.  Skin: Skin is warm and dry. No abrasion and no  rash noted.  Psychiatric: She has a normal mood and affect. Her speech is normal and behavior is normal.    ED Course  Procedures (including critical care time)  COORDINATION OF CARE: 11:09 AM- Oxygen saturation 86% on RA. Patient placed on O2 4L/min and O2 saturation improved to the 90s. Discussed treatment plan which includes IV fluids, CXR, and labs with pt at bedside and pt agreed to plan.     Labs Review Labs Reviewed  CBC WITH DIFFERENTIAL - Abnormal; Notable for the following:    RBC 5.17 (*)    All other components within normal limits  URINALYSIS, ROUTINE W REFLEX MICROSCOPIC - Abnormal; Notable for the following:    Hgb urine dipstick TRACE (*)  Protein, ur TRACE (*)    Leukocytes, UA SMALL (*)    All other components within normal limits  URINE MICROSCOPIC-ADD ON - Abnormal; Notable for the following:    Bacteria, UA FEW (*)    All other components within normal limits  URINE CULTURE  COMPREHENSIVE METABOLIC PANEL  PRO B NATRIURETIC PEPTIDE  TROPONIN I    Imaging Review Dg Chest 2 View  06/22/2013   CLINICAL DATA:  Chest pain with history of COPD and CHF  EXAM: CHEST  2 VIEW  COMPARISON:  PA and lateral chest of March 20, 2013  FINDINGS: The lungs remain hyperinflated. There is stable blunting of the left lateral and posterior costophrenic angles. The cardiac silhouette remains enlarged. The pulmonary vascularity is not engorged. There is calcification in the aortic arch and descending thoracic aorta. The bony thorax is unremarkable.  IMPRESSION: COPD and chronic cardiomegaly. There is no acute cardiopulmonary disease.   Electronically Signed   By: David  [place]   On: 06/22/2013 11:47     EKG Interpretation   Date/Time:  Wednesday June 22 2013 11:17:43 EDT Ventricular Rate:  72 PR Interval:  187 QRS Duration: 102 QT Interval:  488 QTC Calculation: 534 R Axis:   -24 Text Interpretation:  Sinus rhythm Multiple ventricular premature  complexes Inferior  infarct, old Lateral leads are also involved Prolonged  QT interval Confirmed by Freida BusmanALLEN  MD, ANTHONY ([id number]) on 06/22/2013 12:12:43  PM      MDM   Final diagnoses:  None   Patient's hypokalemia treated with oral and IV potassium. Patient also with some wheezing on exam and she was given albuterol with Atrovent. Patient to be started on Levaquin for her UTI as was likely COPD exacerbation. She is protecting her airway. She is mentating appropriately. Do not think that she used to have a head CT at this time. Will be admitted by medicine  CRITICAL CARE Performed by: Toy BakerALLEN,ANTHONY T Total critical care time: 40 Critical care time was exclusive of separately billable procedures and treating other patients. Critical care was necessary to treat or prevent imminent or life-threatening deterioration. Critical care was time spent personally by me on the following activities: development of treatment plan with patient and/or surrogate as well as nursing, discussions with consultants, evaluation of patient's response to treatment, examination of patient, obtaining history from patient or surrogate, ordering and performing treatments and interventions, ordering and review of laboratory studies, ordering and review of radiographic studies, pulse oximetry and re-evaluation of patient's condition.    Toy BakerAnthony T Allen, MD 06/22/13 [id number]

## 2013-06-23 LAB — BASIC METABOLIC PANEL
BUN: 39 mg/dL — ABNORMAL HIGH (ref 6–23)
CO2: 30 mEq/L (ref 19–32)
Calcium: 9.4 mg/dL (ref 8.4–10.5)
Chloride: 90 mEq/L — ABNORMAL LOW (ref 96–112)
Creatinine, Ser: 1.56 mg/dL — ABNORMAL HIGH (ref 0.50–1.10)
GFR calc Af Amer: 34 mL/min — ABNORMAL LOW (ref >90)
GFR, EST NON AFRICAN AMERICAN: 29 mL/min — AB (ref >90)
GLUCOSE: 169 mg/dL — AB (ref 70–99)
POTASSIUM: 2.9 meq/L — AB (ref 3.7–5.3)
SODIUM: 134 meq/L — AB (ref 137–147)

## 2013-06-23 LAB — CBC
HCT: 41 % (ref 36.0–46.0)
HEMOGLOBIN: 13.8 g/dL (ref 12.0–15.0)
MCH: 26.8 pg (ref 26.0–34.0)
MCHC: 33.7 g/dL (ref 30.0–36.0)
MCV: 79.8 fL (ref 78.0–100.0)
Platelets: 217 10*3/uL (ref 150–400)
RBC: 5.14 MIL/uL — AB (ref 3.87–5.11)
RDW: 14.1 % (ref 11.5–15.5)
WBC: 8.4 10*3/uL (ref 4.0–10.5)

## 2013-06-23 LAB — VITAMIN D 25 HYDROXY (VIT D DEFICIENCY, FRACTURES): VIT D 25 HYDROXY: 14 ng/mL — AB (ref 30–89)

## 2013-06-23 LAB — FOLATE RBC: RBC Folate: 348 ng/mL (ref >280)

## 2013-06-23 LAB — VITAMIN B12: VITAMIN B 12: 638 pg/mL (ref 211–911)

## 2013-06-23 LAB — TSH: TSH: 1.88 u[IU]/mL (ref 0.350–4.500)

## 2013-06-23 MED ORDER — POTASSIUM CHLORIDE CRYS ER 20 MEQ PO TBCR
40.0000 meq | EXTENDED_RELEASE_TABLET | Freq: Once | ORAL | Status: AC
  Administered 2013-06-23: 40 meq via ORAL
  Filled 2013-06-23: qty 2

## 2013-06-23 MED ORDER — POTASSIUM CHLORIDE CRYS ER 20 MEQ PO TBCR
20.0000 meq | EXTENDED_RELEASE_TABLET | Freq: Once | ORAL | Status: AC
  Administered 2013-06-23: 20 meq via ORAL
  Filled 2013-06-23: qty 1

## 2013-06-23 NOTE — Progress Notes (Signed)
CRITICAL VALUE ALERT  Critical value received:  Potassium 2.9  Date of notification:  06/23/2013  Time of notification:  0728  Critical value read back:yes  Nurse who received alert: Koleen Nimrod Adriyana Greenbaum RN  MD notified (1st page):  Dr Juanetta GoslingHawkins  Time of first page:  0729  MD notified (2nd page):  Time of second page:  Responding MD:  Dr Juanetta GoslingHawkins  Time MD responded:  0730

## 2013-06-23 NOTE — Progress Notes (Signed)
Subjective: She was admitted with COPD exacerbation. She also has dementia which is pretty severe, CHF and renal failure. She was hypokalemic on admission. Her potassium has been high as an outpatient so her potassium replacement has been reduced. She is still hypokalemic now. She is still confused.  Objective: Vital signs in last 24 hours: Temp:  [97.3 F (36.3 C)-99.5 F (37.5 C)] 98.1 F (36.7 C) (06/18 0658) Pulse Rate:  [65-77] 77 (06/18 0658) Resp:  [16-22] 16 (06/18 0658) BP: (116-139)/(49-84) 116/84 mmHg (06/18 0658) SpO2:  [86 %-97 %] 92 % (06/18 0705) Weight:  [62.4 kg (137 lb 9.1 oz)-63.957 kg (141 lb)] 63.549 kg (140 lb 1.6 oz) (06/18 0658) Weight change:  Last BM Date: 06/22/13  Intake/Output from previous day: 06/17 0701 - 06/18 0700 In: 240 [P.O.:240] Out: 15 [Urine:15]  PHYSICAL EXAM General appearance: alert, cooperative, mild distress and Confused Resp: rhonchi bilaterally Cardio: regular rate and rhythm, S1, S2 normal, no murmur, click, rub or gallop GI: soft, non-tender; bowel sounds normal; no masses,  no organomegaly Extremities: 1+  Lab Results:    Basic Metabolic Panel:  Recent Labs  01/08/7204/17/15 1144 06/23/13 0612  NA 134* 134*  K 2.3* 2.9*  CL 86* 90*  CO2 32 30  GLUCOSE 101* 169*  BUN 38* 39*  CREATININE 1.76* 1.56*  CALCIUM 8.7 9.4   Liver Function Tests:  Recent Labs  06/22/13 1144  AST 22  ALT 14  ALKPHOS 133*  BILITOT 0.6  PROT 7.3  ALBUMIN 3.6   No results found for this basename: LIPASE, AMYLASE,  in the last 72 hours No results found for this basename: AMMONIA,  in the last 72 hours CBC:  Recent Labs  06/22/13 1144 06/23/13 0612  WBC 7.9 8.4  NEUTROABS 5.1  --   HGB 13.9 13.8  HCT 41.5 41.0  MCV 80.3 79.8  PLT 226 217   Cardiac Enzymes:  Recent Labs  06/22/13 1144  TROPONINI <0.30   BNP:  Recent Labs  06/22/13 1144  PROBNP 1891.0*   D-Dimer: No results found for this basename: DDIMER,  in the last  72 hours CBG: No results found for this basename: GLUCAP,  in the last 72 hours Hemoglobin A1C: No results found for this basename: HGBA1C,  in the last 72 hours Fasting Lipid Panel: No results found for this basename: CHOL, HDL, LDLCALC, TRIG, CHOLHDL, LDLDIRECT,  in the last 72 hours Thyroid Function Tests:  Recent Labs  06/22/13 1144  TSH 1.880   Anemia Panel:  Recent Labs  06/22/13 1714  VITAMINB12 638   Coagulation: No results found for this basename: LABPROT, INR,  in the last 72 hours Urine Drug Screen: Drugs of Abuse  No results found for this basename: labopia, cocainscrnur, labbenz, amphetmu, thcu, labbarb    Alcohol Level: No results found for this basename: ETH,  in the last 72 hours Urinalysis:  Recent Labs  06/22/13 1206  COLORURINE YELLOW  LABSPEC 1.010  PHURINE 6.5  GLUCOSEU NEGATIVE  HGBUR TRACE*  BILIRUBINUR NEGATIVE  KETONESUR NEGATIVE  PROTEINUR TRACE*  UROBILINOGEN 0.2  NITRITE NEGATIVE  LEUKOCYTESUR SMALL*   Misc. Labs:  ABGS No results found for this basename: PHART, PCO2, PO2ART, TCO2, HCO3,  in the last 72 hours CULTURES No results found for this or any previous visit (from the past 240 hour(s)). Studies/Results: Dg Chest 2 View  06/22/2013   CLINICAL DATA:  Chest pain with history of COPD and CHF  EXAM: CHEST  2 VIEW  COMPARISON:  PA and lateral chest of March 20, 2013  FINDINGS: The lungs remain hyperinflated. There is stable blunting of the left lateral and posterior costophrenic angles. The cardiac silhouette remains enlarged. The pulmonary vascularity is not engorged. There is calcification in the aortic arch and descending thoracic aorta. The bony thorax is unremarkable.  IMPRESSION: COPD and chronic cardiomegaly. There is no acute cardiopulmonary disease.   Electronically Signed   By: David  [place]   On: 06/22/2013 11:47    Medications:  Prior to Admission:  Prescriptions prior to admission  Medication Sig Dispense Refill  .  acetaminophen (TYLENOL) 500 MG tablet Take 500 mg by mouth every 4 (four) hours as needed for mild pain or fever.      Marland Kitchen. amLODipine (NORVASC) 10 MG tablet Take 10 mg by mouth daily.        . carvedilol (COREG) 12.5 MG tablet Take 12.5 mg by mouth 2 (two) times daily.      Marland Kitchen. escitalopram (LEXAPRO) 20 MG tablet Take 20 mg by mouth daily.      Marland Kitchen. levETIRAcetam (KEPPRA) 250 MG tablet Take 250 mg by mouth 2 (two) times daily.        . magnesium oxide (MAG-OX) 400 MG tablet Take 400 mg by mouth 2 (two) times daily.      . Memantine HCl ER (NAMENDA XR) 28 MG CP24 Take 28 mg by mouth daily.      . metoprolol (LOPRESSOR) 50 MG tablet Take 50 mg by mouth every 12 (twelve) hours.      . pantoprazole (PROTONIX) 40 MG tablet Take 40 mg by mouth daily.      . potassium chloride (K-DUR,KLOR-CON) 10 MEQ tablet Take 10 mEq by mouth every other day.      . rivastigmine (EXELON) 9.5 mg/24hr Place 9.5 mg onto the skin daily.      . simethicone (MYLICON) 80 MG chewable tablet Chew 80 mg by mouth 3 (three) times daily.      Marland Kitchen. torsemide (DEMADEX) 10 MG tablet Take 5 mg by mouth 2 (two) times daily.      Marland Kitchen. venlafaxine (EFFEXOR-XR) 37.5 MG 24 hr capsule Take 37.5 mg by mouth daily.        Marland Kitchen. alum & mag hydroxide-simeth (MAALOX/MYLANTA) 200-200-20 MG/5ML suspension Take 10 mLs by mouth every 4 (four) hours as needed. For indigestion      . benzonatate (TESSALON) 200 MG capsule Take 200 mg by mouth 3 (three) times daily.      . diphenoxylate-atropine (LOMOTIL) 2.5-0.025 MG per tablet Take 1 tablet by mouth 4 (four) times daily as needed. For diarrhea       Scheduled: . amLODipine  10 mg Oral Daily  . benzonatate  200 mg Oral TID  . carvedilol  12.5 mg Oral BID  . enoxaparin (LOVENOX) injection  30 mg Subcutaneous Q24H  . escitalopram  20 mg Oral Daily  . ipratropium-albuterol  3 mL Nebulization Q4H WA  . levETIRAcetam  250 mg Oral BID  . [START ON 06/24/2013] levofloxacin (LEVAQUIN) IV  500 mg Intravenous Q48H  .  magnesium oxide  400 mg Oral BID  . Memantine HCl ER  28 mg Oral Daily  . methylPREDNISolone (SOLU-MEDROL) injection  60 mg Intravenous Q6H  . pantoprazole  40 mg Oral Daily  . potassium chloride  20 mEq Oral Once  . rivastigmine  9.5 mg Transdermal Daily  . simethicone  80 mg Oral TID  . sodium chloride  3 mL Intravenous  Q12H  . torsemide  5 mg Oral BID  . venlafaxine XR  37.5 mg Oral Daily   Continuous: . sodium chloride 20 mL/hr at 06/22/13 1206   ZOX:WRUEAV chloride, acetaminophen, acetaminophen, acetaminophen, alum & mag hydroxide-simeth, diphenoxylate-atropine, HYDROcodone-acetaminophen, morphine injection, sodium chloride  Assesment: She was admitted with COPD exacerbation. She is improved. This caused her to have an acute exacerbation of her dementia and she has been more confused. She has chronic systolic heart failure but right now that appears to be stable. She has chronic renal failure which is about the same. She is hypokalemic and still hypokalemic. Principal Problem:   COPD exacerbation Active Problems:   Chronic systolic congestive heart failure   Chronic renal failure   Dementia   Hypokalemia    Plan: Continue current treatments. Potassium replacement.    LOS: 1 day   HAWKINS,EDWARD L 06/23/2013, 9:03 AM

## 2013-06-23 NOTE — Progress Notes (Signed)
Utilization Review Complete  

## 2013-06-23 NOTE — Clinical Social Work Psychosocial (Signed)
Clinical Social Work Department BRIEF PSYCHOSOCIAL ASSESSMENT 06/23/2013  Patient:  Melissa [name], Melissa [name]     Account Number:  [id number]     Admit date:  06/22/2013  Clinical Social Worker:  Wyatt Haste  Date/Time:  06/23/2013 04:23 PM  Referred by:  CSW  Date Referred:  06/23/2013 Referred for  ALF Placement   Other Referral:   Interview type:  Patient Other interview type:   voicemail and message left for son- Dr. Evalee Jefferson to return call    PSYCHOSOCIAL DATA Living Status:  FACILITY Admitted from facility:  [facility] Level of care:  Assisted Living Primary support name:  Gwyndolyn Saxon Primary support relationship to patient:  CHILD, ADULT Degree of support available:   supportive    CURRENT CONCERNS Current Concerns  Post-Acute Placement   Other Concerns:    SOCIAL WORK ASSESSMENT / PLAN CSW met with pt at bedside. Pt alert, but pleasantly confused. She is aware she is at hospital. Pt hs been a resident at [facility]) for several years. Admitted to hospital yesterday due to COPD exacerbation. CSW left voicemail at pt's son's (Dr. Evalee Jefferson) home number as well as message at work requesting return call. Will attempt again tomorrow. Per Sharyn Lull, med tech at [facility], pt requires very limited assist, primarily with bathing and medications. She primarily uses a wheelchair, but can ambulate short distances with a walker. She reports pt was weak yesterday and staff became concerned. Sharyn Lull indicates pt's memory loss has become more apparent recently. She reports no issues with return to facility at d/c. PT evaluated pt today and recommendation is for home health. Facility notified.   Assessment/plan status:  Psychosocial Support/Ongoing Assessment of Needs Other assessment/ plan:   Information/referral to community resources:   Nanine Means of [employer]    PATIENT'S/FAMILY'S RESPONSE TO PLAN OF CARE: Pt unable to discuss plan of care due to  dementia. CSW will attempt to reach pt's son again tomorrow. Anticipate return to [facility] when medically stable.       Benay Pike, [place]

## 2013-06-23 NOTE — Evaluation (Signed)
Physical Therapy Evaluation Patient Details Name: Joetta MannersHelen H Pursell MRN: [id number] DOB: January 09, 1925 Today's Date: 06/23/2013   History of Present Illness  Joetta MannersHelen H Basques is a 80 y.o. female With a history of COPD, congestive heart failure, hypertension, diabetes mellitus that presented to the emergency department for generalized weakness and shortness of breath. Patient states her shortness of breath started approximately 3 days ago. Patient was brought in by EMS and was noted to have hypoxia, and was placed on 4 L of oxygen in the emergency department. Her saturations improved to 94%. Patient complains of feeling generally confused herself. Although she is able to answer all questions appropriately. She denies any dizziness or lightheadedness, chest pain, cough, headache, fever, abdominal pain nausea, vomiting or constipation. Patient denies any change in her bowel or urine habits. She denies any burning with urination.  Patient denies any sick contacts or recent travel.  Clinical Impression  Pt is an 80 year old female who was transferred from her ALF to the hospital secondary to SOB with activity.  Pt is a poor historian, and history received from pt as caregiver/family not present for evaluation.  Pt reports she lives at an ALF, where she receives assistance with toileting and amb in the home with use of a walker (unable to specify standard versus rolling walker, but she thinks a rolling walker).  She reports she was (I) with bed mobility skills, though it was difficult as there were no rails on the bed.  During evaluation, MMT unable to be formally assessed as pt unable to follow commands for strength testing, so generalized weakness assessed through functional mobility skills. Pt did demonstrate difficulty with balance activity, sitting EOB and upright standing, requiring continuous VC for hand/foot placement and upright standing as pt had a tendency for posterior lean.  Min/mod assist required to maintain  position.  Pt able to maintain good O2 stats supine, sitting, and standing, though a drop was noted during gait requiring a seated rest break to recovery; VC for breathing techniques.  Pt required assist with supine -> sit and min guard during transfers and short distance gait. Gait limited secondary to O2 level dropping.  Recommend continued PT while in the hospital to address strengthening, activity tolerance, for improvement of functional mobility skills, and transition to HHPT at her ALF for continued rehab.  No DME recommendations at this time.     Follow Up Recommendations Home health PT    Equipment Recommendations  None recommended by PT    Recommendations for Other Services OT consult     Precautions / Restrictions Precautions Precautions: Fall Restrictions Weight Bearing Restrictions: No      Mobility  Bed Mobility Overal bed mobility: Needs Assistance Bed Mobility: Supine to Sit;Sit to Supine     Supine to sit: Mod assist (for trunk) Sit to supine: Min guard   General bed mobility comments: Pt reports this is improved from yesterday, as she was "unable to move" yesterday  Transfers Overall transfer level: Needs assistance Equipment used: Rolling walker (2 wheeled) Transfers: Sit to/from Stand Sit to Stand: Min guard            Ambulation/Gait Ambulation/Gait assistance: Min guard Ambulation Distance (Feet): 8 Feet Assistive device: Rolling walker (2 wheeled) Gait Pattern/deviations: Step-to pattern;Decreased step length - left;Decreased step length - right   Gait velocity interpretation: Below normal speed for age/gender        Balance Overall balance assessment: Needs assistance Sitting-balance support: Bilateral upper extremity supported;Feet supported Sitting  balance-Leahy Scale: Poor Sitting balance - Comments: Continuous VC for (B) LE and UE placement to maintain balance at EOB.  Intermittent min/mod assist to maintain position.   Postural  control: Posterior lean Standing balance support: During functional activity;Bilateral upper extremity supported (On RW, with min guard assist during gait.  Standing, with min/mod assist and VC for upright posturing secondary to posterior lean. ) Standing balance-Leahy Scale: Fair                               Pertinent Vitals/Pain No pain reported, or grimacing noted during functional tasks.     Home Living Family/patient expects to be discharged to:: Assisted living               Home Equipment: Wheelchair - manual;Walker - 2 wheels;Grab bars - tub/shower;Grab bars - toilet;Shower seat      Prior Function Level of Independence: Needs assistance   Gait / Transfers Assistance Needed: Pt questionable historian, history recieved from pt.  Pt reports she was mod (I) with bed mobility, though required assistance with transers and gait.                Extremity/Trunk Assessment               Lower Extremity Assessment: Generalized weakness (Unable to formally assess as pt unable to follow commands for MMT testing.  General assessment through functional movement/mobility 4-/5 in LE)         Communication   Communication: No difficulties  Cognition Arousal/Alertness: Awake/alert Behavior During Therapy: WFL for tasks assessed/performed Overall Cognitive Status: No family/caregiver present to determine baseline cognitive functioning                               Assessment/Plan    PT Assessment Patient needs continued PT services  PT Diagnosis Difficulty walking;Generalized weakness   PT Problem List Decreased strength;Decreased safety awareness;Decreased activity tolerance;Decreased balance;Decreased mobility  PT Treatment Interventions Gait training;Functional mobility training;Therapeutic activities;Therapeutic exercise;Balance training;Neuromuscular re-education   PT Goals (Current goals can be found in the Care Plan section) Acute Rehab  PT Goals PT Goal Formulation: With patient Time For Goal Achievement: 07/07/13 Potential to Achieve Goals: Good    Frequency Min 3X/week    End of Session Equipment Utilized During Treatment: Gait belt;Oxygen Activity Tolerance: Patient tolerated treatment well Patient left: in bed;with call bell/phone within reach;with bed alarm set           Time: [id number] PT Time Calculation (min): 18 min   Charges:   PT Evaluation $Initial PT Evaluation Tier I: 1 Procedure          WOODWORTH,STEPHANIE 06/23/2013, 10:17 AM

## 2013-06-23 NOTE — Care Management Note (Addendum)
    Page 1 of 1   06/24/2013     12:54:07 PM CARE MANAGEMENT NOTE 06/24/2013  Patient:  Melissa [name],Melissa [name]   Account Number:  [id number]  Date Initiated:  06/23/2013  Documentation initiated by:  Rosemary HolmsOBSON,AMY  Subjective/Objective Assessment:   [facility] Anticipate return to facility     Action/Plan:   Anticipated DC Date:  06/25/2013   Anticipated DC Plan:  ASSISTED LIVING / REST HOME  In-house referral  Clinical Social Worker      DC Planning Services  CM consult      Choice offered to / List presented to:     DME arranged  [facility]      [facility]        Status of service:  Completed, signed off Medicare Important Message given?  YES (If response is "NO", the following Medicare IM given date fields will be blank) Date Medicare IM given:  06/24/2013 Date Additional Medicare IM given:    Discharge Disposition:  ASSISTED LIVING  Per UR Regulation:    If discussed at Long Length of Stay Meetings, dates discussed:    Comments:  06/24/13 Rosemary HolmsAmy Robson RN BSN CM [facility] is provider of choice from [facility]. Mandy with [facility] will have o2 tank delivered to room today. Order faxed but still needs O2 desat documentation faxed to [phone]  06/23/13 Amy Leanord Hawkingobson RN BSN CM

## 2013-06-24 LAB — BASIC METABOLIC PANEL
BUN: 42 mg/dL — ABNORMAL HIGH (ref 6–23)
CALCIUM: 9.4 mg/dL (ref 8.4–10.5)
CO2: 33 meq/L — AB (ref 19–32)
Chloride: 88 mEq/L — ABNORMAL LOW (ref 96–112)
Creatinine, Ser: 1.64 mg/dL — ABNORMAL HIGH (ref 0.50–1.10)
GFR calc Af Amer: 32 mL/min — ABNORMAL LOW (ref >90)
GFR calc non Af Amer: 27 mL/min — ABNORMAL LOW (ref >90)
Glucose, Bld: 148 mg/dL — ABNORMAL HIGH (ref 70–99)
Potassium: 3.3 mEq/L — ABNORMAL LOW (ref 3.7–5.3)
SODIUM: 134 meq/L — AB (ref 137–147)

## 2013-06-24 LAB — URINE CULTURE: Colony Count: >=100000

## 2013-06-24 MED ORDER — PREDNISONE 20 MG PO TABS
40.0000 mg | ORAL_TABLET | Freq: Every day | ORAL | Status: DC
  Administered 2013-06-24 – 2013-06-26 (×3): 40 mg via ORAL
  Filled 2013-06-24 (×3): qty 2

## 2013-06-24 MED ORDER — POTASSIUM CHLORIDE CRYS ER 20 MEQ PO TBCR
40.0000 meq | EXTENDED_RELEASE_TABLET | Freq: Once | ORAL | Status: AC
  Administered 2013-06-24: 40 meq via ORAL
  Filled 2013-06-24: qty 2

## 2013-06-24 NOTE — Progress Notes (Signed)
Subjective: She has improved. She looks better. She says she feels better. She has no other new complaints.  Objective: Vital signs in last 24 hours: Temp:  [97.7 F (36.5 C)-98.5 F (36.9 C)] 97.7 F (36.5 C) (06/19 0442) Pulse Rate:  [72-74] 73 (06/19 0442) Resp:  [12-18] 12 (06/19 0442) BP: (90-136)/(58-77) 90/77 mmHg (06/19 0442) SpO2:  [91 %-97 %] 94 % (06/19 0734) Weight:  [62.914 kg (138 lb 11.2 oz)] 62.914 kg (138 lb 11.2 oz) (06/19 0442) Weight change: -1.043 kg (-2 lb 4.8 oz) Last BM Date: 06/23/13  Intake/Output from previous day: 06/18 0701 - 06/19 0700 In: -  Out: 501 [Urine:500; Stool:1]  PHYSICAL EXAM General appearance: alert, cooperative, mild distress and confused Resp: rhonchi bilaterally Cardio: regular rate and rhythm, S1, S2 normal, no murmur, click, rub or gallop GI: soft, non-tender; bowel sounds normal; no masses,  no organomegaly Extremities: extremities normal, atraumatic, no cyanosis or edema  Lab Results:  Results for orders placed during the hospital encounter of 06/22/13 (from the past 48 hour(s))  CBC WITH DIFFERENTIAL     Status: Abnormal   Collection Time    06/22/13 11:44 AM      Result Value Ref Range   WBC 7.9  4.0 - 10.5 K/uL   RBC 5.17 (*) 3.87 - 5.11 MIL/uL   Hemoglobin 13.9  12.0 - 15.0 g/dL   HCT 41.5  36.0 - 46.0 %   MCV 80.3  78.0 - 100.0 fL   MCH 26.9  26.0 - 34.0 pg   MCHC 33.5  30.0 - 36.0 g/dL   RDW 14.2  11.5 - 15.5 %   Platelets 226  150 - 400 K/uL   Neutrophils Relative % 64  43 - 77 %   Neutro Abs 5.1  1.7 - 7.7 K/uL   Lymphocytes Relative 22  12 - 46 %   Lymphs Abs 1.7  0.7 - 4.0 K/uL   Monocytes Relative 12  3 - 12 %   Monocytes Absolute 1.0  0.1 - 1.0 K/uL   Eosinophils Relative 2  0 - 5 %   Eosinophils Absolute 0.1  0.0 - 0.7 K/uL   Basophils Relative 0  0 - 1 %   Basophils Absolute 0.0  0.0 - 0.1 K/uL  COMPREHENSIVE METABOLIC PANEL     Status: Abnormal   Collection Time    06/22/13 11:44 AM      Result  Value Ref Range   Sodium 134 (*) 137 - 147 mEq/L   Potassium 2.3 (*) 3.7 - 5.3 mEq/L   Comment: CRITICAL RESULT CALLED TO, READ BACK BY AND VERIFIED WITH:     MEADOWS,G AT 1305 BY HUFFINES,S ON 06/22/13   Chloride 86 (*) 96 - 112 mEq/L   CO2 32  19 - 32 mEq/L   Glucose, Bld 101 (*) 70 - 99 mg/dL   BUN 38 (*) 6 - 23 mg/dL   Creatinine, Ser 1.76 (*) 0.50 - 1.10 mg/dL   Calcium 8.7  8.4 - 10.5 mg/dL   Total Protein 7.3  6.0 - 8.3 g/dL   Albumin 3.6  3.5 - 5.2 g/dL   AST 22  0 - 37 U/L   ALT 14  0 - 35 U/L   Alkaline Phosphatase 133 (*) 39 - 117 U/L   Total Bilirubin 0.6  0.3 - 1.2 mg/dL   GFR calc non Af Amer 25 (*) >90 mL/min   GFR calc Af Amer 29 (*) >90 mL/min   Comment: (  NOTE)     The eGFR has been calculated using the CKD EPI equation.     This calculation has not been validated in all clinical situations.     eGFR's persistently <90 mL/min signify possible Chronic Kidney     Disease.  PRO B NATRIURETIC PEPTIDE     Status: Abnormal   Collection Time    06/22/13 11:44 AM      Result Value Ref Range   Pro B Natriuretic peptide (BNP) 1891.0 (*) 0 - 450 pg/mL  TROPONIN I     Status: None   Collection Time    06/22/13 11:44 AM      Result Value Ref Range   Troponin I <0.30  <0.30 ng/mL   Comment:            Due to the release kinetics of cTnI,     a negative result within the first hours     of the onset of symptoms does not rule out     myocardial infarction with certainty.     If myocardial infarction is still suspected,     repeat the test at appropriate intervals.  TSH     Status: None   Collection Time    06/22/13 11:44 AM      Result Value Ref Range   TSH 1.880  0.350 - 4.500 uIU/mL   Comment: Performed at [place] MICROSCOPIC     Status: Abnormal   Collection Time    06/22/13 12:06 PM      Result Value Ref Range   Color, Urine YELLOW  YELLOW   APPearance CLEAR  CLEAR   Specific Gravity, Urine 1.010  1.005 - 1.030   pH  6.5  5.0 - 8.0   Glucose, UA NEGATIVE  NEGATIVE mg/dL   Hgb urine dipstick TRACE (*) NEGATIVE   Bilirubin Urine NEGATIVE  NEGATIVE   Ketones, ur NEGATIVE  NEGATIVE mg/dL   Protein, ur TRACE (*) NEGATIVE mg/dL   Urobilinogen, UA 0.2  0.0 - 1.0 mg/dL   Nitrite NEGATIVE  NEGATIVE   Leukocytes, UA SMALL (*) NEGATIVE  URINE CULTURE     Status: None   Collection Time    06/22/13 12:06 PM      Result Value Ref Range   Specimen Description URINE, CATHETERIZED     Special Requests NONE     Culture  Setup Time       Value: 06/22/2013 18:15     Performed at [place]       Value: >=100,000 COLONIES/ML     Performed at [facility]   Culture       Value: ENTEROCOCCUS SPECIES     Performed at [facility]   Report Status PENDING    URINE MICROSCOPIC-ADD ON     Status: Abnormal   Collection Time    06/22/13 12:06 PM      Result Value Ref Range   WBC, UA 7-10  <3 WBC/hpf   RBC / HPF 7-10  <3 RBC/hpf   Bacteria, UA FEW (*) RARE  VITAMIN D 25 HYDROXY     Status: Abnormal   Collection Time    06/22/13  5:14 PM      Result Value Ref Range   Vit D, 25-Hydroxy 14 (*) 30 - 89 ng/mL   Comment: (NOTE)     This assay accurately quantifies Vitamin D, which is the sum of the  25-Hydroxy forms of Vitamin D2 and D3.  Studies have shown that the     optimum concentration of 25-Hydroxy Vitamin D is 30 ng/mL or higher.      Concentrations of Vitamin D between 20 and 29 ng/mL are considered to     be insufficient and concentrations less than 20 ng/mL are considered     to be deficient for Vitamin D.     Performed at [place] RBC     Status: None   Collection Time    06/22/13  5:14 PM      Result Value Ref Range   RBC Folate 348  >280 ng/mL   Comment: Reference range not established for pediatric patients.     Performed at [facility]  VITAMIN B12     Status: None   Collection Time    06/22/13  5:14 PM      Result Value  Ref Range   Vitamin B-12 638  211 - 911 pg/mL   Comment: Performed at [place]     Status: Abnormal   Collection Time    06/23/13  6:12 AM      Result Value Ref Range   Sodium 134 (*) 137 - 147 mEq/L   Potassium 2.9 (*) 3.7 - 5.3 mEq/L   Comment: CRITICAL RESULT CALLED TO, READ BACK BY AND VERIFIED WITH:     MORRIS,C ON 06/23/13 @0720  BY MITCHELL,S   Chloride 90 (*) 96 - 112 mEq/L   CO2 30  19 - 32 mEq/L   Glucose, Bld 169 (*) 70 - 99 mg/dL   BUN 39 (*) 6 - 23 mg/dL   Creatinine, Ser 1.56 (*) 0.50 - 1.10 mg/dL   Calcium 9.4  8.4 - 10.5 mg/dL   GFR calc non Af Amer 29 (*) >90 mL/min   GFR calc Af Amer 34 (*) >90 mL/min   Comment: (NOTE)     The eGFR has been calculated using the CKD EPI equation.     This calculation has not been validated in all clinical situations.     eGFR's persistently <90 mL/min signify possible Chronic Kidney     Disease.  CBC     Status: Abnormal   Collection Time    06/23/13  6:12 AM      Result Value Ref Range   WBC 8.4  4.0 - 10.5 K/uL   RBC 5.14 (*) 3.87 - 5.11 MIL/uL   Hemoglobin 13.8  12.0 - 15.0 g/dL   HCT 41.0  36.0 - 46.0 %   MCV 79.8  78.0 - 100.0 fL   MCH 26.8  26.0 - 34.0 pg   MCHC 33.7  30.0 - 36.0 g/dL   RDW 14.1  11.5 - 15.5 %   Platelets 217  150 - 400 K/uL  BASIC METABOLIC PANEL     Status: Abnormal   Collection Time    06/24/13  6:07 AM      Result Value Ref Range   Sodium 134 (*) 137 - 147 mEq/L   Potassium 3.3 (*) 3.7 - 5.3 mEq/L   Chloride 88 (*) 96 - 112 mEq/L   CO2 33 (*) 19 - 32 mEq/L   Glucose, Bld 148 (*) 70 - 99 mg/dL   BUN 42 (*) 6 - 23 mg/dL   Creatinine, Ser 1.64 (*) 0.50 - 1.10 mg/dL   Calcium 9.4  8.4 - 10.5 mg/dL   GFR calc non Af Amer 27 (*) >  90 mL/min   GFR calc Af Amer 32 (*) >90 mL/min   Comment: (NOTE)     The eGFR has been calculated using the CKD EPI equation.     This calculation has not been validated in all clinical situations.     eGFR's persistently <90 mL/min  signify possible Chronic Kidney     Disease.    ABGS No results found for this basename: PHART, PCO2, PO2ART, TCO2, HCO3,  in the last 72 hours CULTURES Recent Results (from the past 240 hour(s))  URINE CULTURE     Status: None   Collection Time    06/22/13 12:06 PM      Result Value Ref Range Status   Specimen Description URINE, CATHETERIZED   Final   Special Requests NONE   Final   Culture  Setup Time     Final   Value: 06/22/2013 18:15     Performed at [facility] [place]     Final   Value: >=100,000 COLONIES/ML     Performed at [facility]   Culture     Final   Value: ENTEROCOCCUS SPECIES     Performed at [facility]   Report Status PENDING   Incomplete   Studies/Results: Dg Chest 2 View  06/22/2013   CLINICAL DATA:  Chest pain with history of COPD and CHF  EXAM: CHEST  2 VIEW  COMPARISON:  PA and lateral chest of March 20, 2013  FINDINGS: The lungs remain hyperinflated. There is stable blunting of the left lateral and posterior costophrenic angles. The cardiac silhouette remains enlarged. The pulmonary vascularity is not engorged. There is calcification in the aortic arch and descending thoracic aorta. The bony thorax is unremarkable.  IMPRESSION: COPD and chronic cardiomegaly. There is no acute cardiopulmonary disease.   Electronically Signed   By: David  Martinique   On: 06/22/2013 11:47    Medications:  Prior to Admission:  Prescriptions prior to admission  Medication Sig Dispense Refill  . acetaminophen (TYLENOL) 500 MG tablet Take 500 mg by mouth every 4 (four) hours as needed for mild pain or fever.      Marland Kitchen amLODipine (NORVASC) 10 MG tablet Take 10 mg by mouth daily.        . carvedilol (COREG) 12.5 MG tablet Take 12.5 mg by mouth 2 (two) times daily.      Marland Kitchen escitalopram (LEXAPRO) 20 MG tablet Take 20 mg by mouth daily.      Marland Kitchen levETIRAcetam (KEPPRA) 250 MG tablet Take 250 mg by mouth 2 (two) times daily.        . magnesium oxide (MAG-OX)  400 MG tablet Take 400 mg by mouth 2 (two) times daily.      . Memantine HCl ER (NAMENDA XR) 28 MG CP24 Take 28 mg by mouth daily.      . metoprolol (LOPRESSOR) 50 MG tablet Take 50 mg by mouth every 12 (twelve) hours.      . pantoprazole (PROTONIX) 40 MG tablet Take 40 mg by mouth daily.      . potassium chloride (K-DUR,KLOR-CON) 10 MEQ tablet Take 10 mEq by mouth every other day.      . rivastigmine (EXELON) 9.5 mg/24hr Place 9.5 mg onto the skin daily.      . simethicone (MYLICON) 80 MG chewable tablet Chew 80 mg by mouth 3 (three) times daily.      Marland Kitchen torsemide (DEMADEX) 10 MG tablet Take 5 mg by mouth 2 (two)  times daily.      Marland Kitchen venlafaxine (EFFEXOR-XR) 37.5 MG 24 hr capsule Take 37.5 mg by mouth daily.        Marland Kitchen alum & mag hydroxide-simeth (MAALOX/MYLANTA) 200-200-20 MG/5ML suspension Take 10 mLs by mouth every 4 (four) hours as needed. For indigestion      . benzonatate (TESSALON) 200 MG capsule Take 200 mg by mouth 3 (three) times daily.      . diphenoxylate-atropine (LOMOTIL) 2.5-0.025 MG per tablet Take 1 tablet by mouth 4 (four) times daily as needed. For diarrhea       Scheduled: . amLODipine  10 mg Oral Daily  . benzonatate  200 mg Oral TID  . carvedilol  12.5 mg Oral BID  . enoxaparin (LOVENOX) injection  30 mg Subcutaneous Q24H  . escitalopram  20 mg Oral Daily  . ipratropium-albuterol  3 mL Nebulization Q4H WA  . levETIRAcetam  250 mg Oral BID  . levofloxacin (LEVAQUIN) IV  500 mg Intravenous Q48H  . magnesium oxide  400 mg Oral BID  . Memantine HCl ER  28 mg Oral Daily  . pantoprazole  40 mg Oral Daily  . potassium chloride  40 mEq Oral Once  . predniSONE  40 mg Oral Q breakfast  . rivastigmine  9.5 mg Transdermal Daily  . simethicone  80 mg Oral TID  . sodium chloride  3 mL Intravenous Q12H  . torsemide  5 mg Oral BID  . venlafaxine XR  37.5 mg Oral Daily   Continuous: . sodium chloride 20 mL/hr at 06/22/13 1206   JJK:KXFGHW chloride, acetaminophen, acetaminophen,  acetaminophen, alum & mag hydroxide-simeth, diphenoxylate-atropine, HYDROcodone-acetaminophen, morphine injection, sodium chloride  Assesment: She was admitted with COPD exacerbation. She is improving. She still has some hypokalemia and I will give her more potassium replacement. Otherwise she's doing well. I think she can probably be transferred back to her assisted living facility tomorrow Principal Problem:   COPD exacerbation Active Problems:   Chronic systolic congestive heart failure   Chronic renal failure   Dementia   Hypokalemia    Plan: Continue treatments. Return to assisted living facility tomorrow    LOS: 2 days   HAWKINS,EDWARD L 06/24/2013, 9:07 AM

## 2013-06-24 NOTE — Progress Notes (Signed)
Physical Therapy Treatment Patient Details Name: Joetta MannersHelen H Ezekiel MRN: [id number] DOB: 1925/03/27 Today's Date: 06/24/2013    History of Present Illness Joetta MannersHelen H Wah is a 80 y.o. female With a history of COPD, congestive heart failure, hypertension, diabetes mellitus that presented to the emergency department for generalized weakness and shortness of breath. Patient states her shortness of breath started approximately 3 days ago. Patient was brought in by EMS and was noted to have hypoxia, and was placed on 4 L of oxygen in the emergency department. Her saturations improved to 94%. Patient complains of feeling generally confused herself. Although she is able to answer all questions appropriately. She denies any dizziness or lightheadedness, chest pain, cough, headache, fever, abdominal pain nausea, vomiting or constipation. Patient denies any change in her bowel or urine habits. She denies any burning with urination.  Patient denies any sick contacts or recent travel.    PT Comments    Pt demonstrates improved functional mobility today, with decreased assistance with bed mobility and improved gait distance.  Pt required continuous VC today when at EOB to maintain upright posture/balance during perform of exercises secondary to decreased core stability; pt has a tendency for posterior lean when at EOB.                Mobility  Bed Mobility Overal bed mobility: Needs Assistance Bed Mobility: Supine to Sit;Sit to Supine     Supine to sit: Min assist (for trunk) Sit to supine: Supervision   General bed mobility comments: Noted improved bed mobilty skills today   Transfers Overall transfer level: Needs assistance Equipment used: Rolling walker (2 wheeled) Transfers: Sit to/from Stand Sit to Stand: Min guard         General transfer comment: VC for handplacement during transfers  Ambulation/Gait Ambulation/Gait assistance: Min guard Ambulation Distance (Feet): 20 Feet Assistive  device: Rolling walker (2 wheeled) Gait Pattern/deviations: Decreased step length - left;Decreased step length - right;Step-through pattern   Gait velocity interpretation: Below normal speed for age/gender               Exercises General Exercises - Lower Extremity Long Arc Quad: Strengthening;Both;20 reps;Seated (2x10; continuous VC for upright posturing and min guard ) Heel Slides: Strengthening;Both;20 reps;Supine (2x10) Hip ABduction/ADduction: Strengthening;Both;20 reps;Supine (2x10) Straight Leg Raises: Strengthening;Both;20 reps;Supine (2x10)        Pertinent Vitals/Pain No pain reported.            PT Goals (current goals can now be found in the care plan section) Progress towards PT goals: Progressing toward goals     End of Session Equipment Utilized During Treatment: Gait belt;Oxygen Activity Tolerance: Patient tolerated treatment well Patient left: in bed;with call bell/phone within reach;with bed alarm set;with family/visitor present     Time: [id number] PT Time Calculation (min): 21 min  Charges:  $Therapeutic Exercise: 8-22 mins                    WOODWORTH,STEPHANIE 06/24/2013, 4:02 PM

## 2013-06-24 NOTE — Clinical Social Work Note (Signed)
CSW spoke with pt's son, Chrissie NoaWilliam. He reports things are going very well at [facility] and requests return. Anticipate d/c tomorrow per MD. Facility and son notified. They will discuss transportation together. RN checking room air sat. Pt has not been on oxygen at facility. CM aware of possible need for oxygen.  Derenda FennelKara Stultz, Kentucky[place]CSW [phone]

## 2013-06-24 NOTE — Progress Notes (Signed)
ANTIBIOTIC CONSULT NOTE - follow up  Pharmacy Consult for Levaquin Indication: UTI & COPD exacerbation  Allergies  Allergen Reactions  . Shellfish Allergy Diarrhea    Severe diarrhea and required hospitalization   Patient Measurements: Height: 5\' 4"  (162.6 cm) Weight: 138 lb 11.2 oz (62.914 kg) IBW/kg (Calculated) : 54.7  Vital Signs: Temp: 97.7 F (36.5 C) (06/19 0442) Temp src: Oral (06/19 0442) BP: 90/77 mmHg (06/19 0442) Pulse Rate: 73 (06/19 0442) Intake/Output from previous day: 06/18 0701 - 06/19 0700 In: -  Out: 501 [Urine:500; Stool:1] Intake/Output from this shift:    Labs:  Recent Labs  06/22/13 1144 06/23/13 0612 06/24/13 0607  WBC 7.9 8.4  --   HGB 13.9 13.8  --   PLT 226 217  --   CREATININE 1.76* 1.56* 1.64*   Estimated Creatinine Clearance: 21.3 ml/min (by C-G formula based on Cr of 1.64). No results found for this basename: Rolm GalaVANCOTROUGH, VANCOPEAK, VANCORANDOM, GENTTROUGH, GENTPEAK, GENTRANDOM, TOBRATROUGH, TOBRAPEAK, TOBRARND, AMIKACINPEAK, AMIKACINTROU, AMIKACIN,  in the last 72 hours   Microbiology: Recent Results (from the past 720 hour(s))  URINE CULTURE     Status: None   Collection Time    06/22/13 12:06 PM      Result Value Ref Range Status   Specimen Description URINE, CATHETERIZED   Final   Special Requests NONE   Final   Culture  Setup Time     Final   Value: 06/22/2013 18:15     Performed at [facility]   Colony Count     Final   Value: >=100,000 COLONIES/ML     Performed at [facility]   Culture     Final   Value: ENTEROCOCCUS SPECIES     Performed at [facility]   Report Status PENDING   Incomplete   Medical History: Past Medical History  Diagnosis Date  . COPD (chronic obstructive pulmonary disease)   . CHF (congestive heart failure)   . Delirium   . Metabolic alkalosis   . Hypertension   . Diabetes mellitus   . Anxiety   . Depression   . Panic attacks   . Chronic diarrhea of unknown  origin   . Gout   . Arthritis    Medications:  Scheduled:  . amLODipine  10 mg Oral Daily  . benzonatate  200 mg Oral TID  . carvedilol  12.5 mg Oral BID  . enoxaparin (LOVENOX) injection  30 mg Subcutaneous Q24H  . escitalopram  20 mg Oral Daily  . ipratropium-albuterol  3 mL Nebulization Q4H WA  . levETIRAcetam  250 mg Oral BID  . levofloxacin (LEVAQUIN) IV  500 mg Intravenous Q48H  . magnesium oxide  400 mg Oral BID  . Memantine HCl ER  28 mg Oral Daily  . pantoprazole  40 mg Oral Daily  . potassium chloride  40 mEq Oral Once  . predniSONE  40 mg Oral Q breakfast  . rivastigmine  9.5 mg Transdermal Daily  . simethicone  80 mg Oral TID  . sodium chloride  3 mL Intravenous Q12H  . torsemide  5 mg Oral BID  . venlafaxine XR  37.5 mg Oral Daily   Assessment: 80yo female admitted with COPD exacerbation and suspected UTI.  Pt has renal insufficiency.  Estimated Creatinine Clearance: 21.3 ml/min (by C-G formula based on Cr of 1.64). Pt is currently afebrile.  Goal of Therapy:  Eradicate infection  Plan:  Levaquin 500 mg IV every 48 hours Monitor renal  function Labs per protocol F/U Levaquin to po when appropriate  Valrie HartHall, Scott A 06/24/2013,8:19 AM

## 2013-06-24 NOTE — Progress Notes (Signed)
Late entry:  Patient's O2 sats at rest 88% on room air.  O2 at 3L [place] reapplied and sats 90%.

## 2013-06-25 LAB — VITAMIN D 1,25 DIHYDROXY
Vitamin D 1, 25 (OH)2 Total: 87 pg/mL — ABNORMAL HIGH (ref 18–72)
Vitamin D3 1, 25 (OH)2: 87 pg/mL

## 2013-06-25 LAB — BASIC METABOLIC PANEL
BUN: 53 mg/dL — AB (ref 6–23)
CHLORIDE: 88 meq/L — AB (ref 96–112)
CO2: 33 mEq/L — ABNORMAL HIGH (ref 19–32)
Calcium: 9 mg/dL (ref 8.4–10.5)
Creatinine, Ser: 1.8 mg/dL — ABNORMAL HIGH (ref 0.50–1.10)
GFR calc Af Amer: 28 mL/min — ABNORMAL LOW (ref >90)
GFR, EST NON AFRICAN AMERICAN: 24 mL/min — AB (ref >90)
GLUCOSE: 133 mg/dL — AB (ref 70–99)
Potassium: 3.4 mEq/L — ABNORMAL LOW (ref 3.7–5.3)
SODIUM: 133 meq/L — AB (ref 137–147)

## 2013-06-25 MED ORDER — LEVOFLOXACIN 500 MG PO TABS: 500.0000 mg | ORAL_TABLET | ORAL | Status: DC

## 2013-06-25 MED ORDER — POTASSIUM CHLORIDE CRYS ER 20 MEQ PO TBCR
40.0000 meq | EXTENDED_RELEASE_TABLET | Freq: Once | ORAL | Status: AC
  Administered 2013-06-25: 40 meq via ORAL
  Filled 2013-06-25: qty 2

## 2013-06-25 MED ORDER — TORSEMIDE 20 MG PO TABS: 20.0000 mg | ORAL_TABLET | Freq: Every day | ORAL | Status: DC

## 2013-06-25 MED ORDER — POTASSIUM CHLORIDE CRYS ER 20 MEQ PO TBCR: 20.0000 meq | EXTENDED_RELEASE_TABLET | Freq: Every day | ORAL | Status: DC

## 2013-06-25 MED ORDER — METHYLPREDNISOLONE (PAK) 4 MG PO TABS: ORAL_TABLET | ORAL | Status: DC

## 2013-06-25 NOTE — Progress Notes (Signed)
Subjective: She says she feels well. She has no new complaints. She is still very weak.  Objective: Vital signs in last 24 hours: Temp:  [98 F (36.7 C)-98.1 F (36.7 C)] 98.1 F (36.7 C) (06/20 0700) Pulse Rate:  [64-80] 64 (06/20 0700) Resp:  [16-18] 18 (06/20 0700) BP: (106-116)/(43-87) 113/87 mmHg (06/20 0700) SpO2:  [86 %-95 %] 91 % (06/20 0738) Weight:  [63.957 kg (141 lb)] 63.957 kg (141 lb) (06/20 0700) Weight change: 1.043 kg (2 lb 4.8 oz) Last BM Date: 06/24/13  Intake/Output from previous day: 06/19 0701 - 06/20 0700 In: 720 [P.O.:720] Out: 950 [Urine:950]  PHYSICAL EXAM General appearance: alert, cooperative and mildly confused as always Resp: rhonchi bilaterally Cardio: regular rate and rhythm, S1, S2 normal, no murmur, click, rub or gallop GI: soft, non-tender; bowel sounds normal; no masses,  no organomegaly Extremities: extremities normal, atraumatic, no cyanosis or edema  Lab Results:  Results for orders placed during the hospital encounter of 06/22/13 (from the past 48 hour(s))  BASIC METABOLIC PANEL     Status: Abnormal   Collection Time    06/24/13  6:07 AM      Result Value Ref Range   Sodium 134 (*) 137 - 147 mEq/L   Potassium 3.3 (*) 3.7 - 5.3 mEq/L   Chloride 88 (*) 96 - 112 mEq/L   CO2 33 (*) 19 - 32 mEq/L   Glucose, Bld 148 (*) 70 - 99 mg/dL   BUN 42 (*) 6 - 23 mg/dL   Creatinine, Ser 1.64 (*) 0.50 - 1.10 mg/dL   Calcium 9.4  8.4 - 10.5 mg/dL   GFR calc non Af Amer 27 (*) >90 mL/min   GFR calc Af Amer 32 (*) >90 mL/min   Comment: (NOTE)     The eGFR has been calculated using the CKD EPI equation.     This calculation has not been validated in all clinical situations.     eGFR's persistently <90 mL/min signify possible Chronic Kidney     Disease.  BASIC METABOLIC PANEL     Status: Abnormal   Collection Time    06/25/13  5:41 AM      Result Value Ref Range   Sodium 133 (*) 137 - 147 mEq/L   Potassium 3.4 (*) 3.7 - 5.3 mEq/L   Chloride  88 (*) 96 - 112 mEq/L   CO2 33 (*) 19 - 32 mEq/L   Glucose, Bld 133 (*) 70 - 99 mg/dL   BUN 53 (*) 6 - 23 mg/dL   Creatinine, Ser 1.80 (*) 0.50 - 1.10 mg/dL   Calcium 9.0  8.4 - 10.5 mg/dL   GFR calc non Af Amer 24 (*) >90 mL/min   GFR calc Af Amer 28 (*) >90 mL/min   Comment: (NOTE)     The eGFR has been calculated using the CKD EPI equation.     This calculation has not been validated in all clinical situations.     eGFR's persistently <90 mL/min signify possible Chronic Kidney     Disease.    ABGS No results found for this basename: PHART, PCO2, PO2ART, TCO2, HCO3,  in the last 72 hours CULTURES Recent Results (from the past 240 hour(s))  URINE CULTURE     Status: None   Collection Time    06/22/13 12:06 PM      Result Value Ref Range Status   Specimen Description URINE, CATHETERIZED   Final   Special Requests NONE   Final  Culture  Setup Time     Final   Value: 06/22/2013 18:15     Performed at [facility]     Final   Value: >=100,000 COLONIES/ML     Performed at [facility]   Culture     Final   Value: ENTEROCOCCUS SPECIES     Performed at [facility]   Report Status 06/24/2013 FINAL   Final   Organism ID, Bacteria ENTEROCOCCUS SPECIES   Final   Studies/Results: No results found.  Medications:  Prior to Admission:  Prescriptions prior to admission  Medication Sig Dispense Refill  . acetaminophen (TYLENOL) 500 MG tablet Take 500 mg by mouth every 4 (four) hours as needed for mild pain or fever.      Marland Kitchen amLODipine (NORVASC) 10 MG tablet Take 10 mg by mouth daily.        . carvedilol (COREG) 12.5 MG tablet Take 12.5 mg by mouth 2 (two) times daily.      Marland Kitchen escitalopram (LEXAPRO) 20 MG tablet Take 20 mg by mouth daily.      Marland Kitchen levETIRAcetam (KEPPRA) 250 MG tablet Take 250 mg by mouth 2 (two) times daily.        . magnesium oxide (MAG-OX) 400 MG tablet Take 400 mg by mouth 2 (two) times daily.      . Memantine HCl ER (NAMENDA  XR) 28 MG CP24 Take 28 mg by mouth daily.      . metoprolol (LOPRESSOR) 50 MG tablet Take 50 mg by mouth every 12 (twelve) hours.      . pantoprazole (PROTONIX) 40 MG tablet Take 40 mg by mouth daily.      . potassium chloride (K-DUR,KLOR-CON) 10 MEQ tablet Take 10 mEq by mouth every other day.      . rivastigmine (EXELON) 9.5 mg/24hr Place 9.5 mg onto the skin daily.      . simethicone (MYLICON) 80 MG chewable tablet Chew 80 mg by mouth 3 (three) times daily.      Marland Kitchen torsemide (DEMADEX) 10 MG tablet Take 5 mg by mouth 2 (two) times daily.      Marland Kitchen venlafaxine (EFFEXOR-XR) 37.5 MG 24 hr capsule Take 37.5 mg by mouth daily.        Marland Kitchen alum & mag hydroxide-simeth (MAALOX/MYLANTA) 200-200-20 MG/5ML suspension Take 10 mLs by mouth every 4 (four) hours as needed. For indigestion      . benzonatate (TESSALON) 200 MG capsule Take 200 mg by mouth 3 (three) times daily.      . diphenoxylate-atropine (LOMOTIL) 2.5-0.025 MG per tablet Take 1 tablet by mouth 4 (four) times daily as needed. For diarrhea       Scheduled: . amLODipine  10 mg Oral Daily  . benzonatate  200 mg Oral TID  . carvedilol  12.5 mg Oral BID  . enoxaparin (LOVENOX) injection  30 mg Subcutaneous Q24H  . escitalopram  20 mg Oral Daily  . ipratropium-albuterol  3 mL Nebulization Q4H WA  . levETIRAcetam  250 mg Oral BID  . levofloxacin (LEVAQUIN) IV  500 mg Intravenous Q48H  . magnesium oxide  400 mg Oral BID  . Memantine HCl ER  28 mg Oral Daily  . pantoprazole  40 mg Oral Daily  . predniSONE  40 mg Oral Q breakfast  . rivastigmine  9.5 mg Transdermal Daily  . simethicone  80 mg Oral TID  . sodium chloride  3 mL Intravenous Q12H  . torsemide  5 mg Oral BID  . venlafaxine XR  37.5 mg Oral Daily   Continuous: . sodium chloride 20 mL/hr at 06/22/13 1206   FAW:NOPWKH chloride, acetaminophen, acetaminophen, acetaminophen, alum & mag hydroxide-simeth, diphenoxylate-atropine, HYDROcodone-acetaminophen, morphine injection, sodium  chloride  Assesment: She was admitted with COPD exacerbation and has improved. She has multiple other medical problems including chronic systolic CHF which seems pretty well controlled at this time and chronic renal failure. Her potassium had been low and is being replaced but that will need to be monitored because of her renal failure Principal Problem:   COPD exacerbation Active Problems:   Chronic systolic congestive heart failure   Chronic renal failure   Dementia   Hypokalemia    Plan: I think she's ready for transfer back to her assisted living facility    LOS: 3 days   HAWKINS,EDWARD L 06/25/2013, 10:15 AM

## 2013-06-25 NOTE — Progress Notes (Signed)
Not[facility] FL[facility].  Waiting on fax to come through.  [facility] with [facility] to make them aware pt would be returning today.
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
## 2013-06-25 NOTE — Discharge Summary (Signed)
Physician Discharge Summary  Patient ID: Melissa [name] MRN: [id number] DOB/AGE: 1925/04/07 80 y.o. Primary Care Physician:HAWKINS,EDWARD L, MD Admit date: 06/22/2013 Discharge date: 06/25/2013    Discharge Diagnoses:   Principal Problem:   COPD exacerbation Active Problems:   Chronic systolic congestive heart failure   Chronic renal failure   Dementia   Hypokalemia     Medication List    STOP taking these medications       metoprolol 50 MG tablet  Commonly known as:  LOPRESSOR      TAKE these medications       acetaminophen 500 MG tablet  Commonly known as:  TYLENOL  Take 500 mg by mouth every 4 (four) hours as needed for mild pain or fever.     alum & mag hydroxide-simeth 200-200-20 MG/5ML suspension  Commonly known as:  MAALOX/MYLANTA  Take 10 mLs by mouth every 4 (four) hours as needed. For indigestion     amLODipine 10 MG tablet  Commonly known as:  NORVASC  Take 10 mg by mouth daily.     benzonatate 200 MG capsule  Commonly known as:  TESSALON  Take 200 mg by mouth 3 (three) times daily.     carvedilol 12.5 MG tablet  Commonly known as:  COREG  Take 12.5 mg by mouth 2 (two) times daily.     diphenoxylate-atropine 2.5-0.025 MG per tablet  Commonly known as:  LOMOTIL  Take 1 tablet by mouth 4 (four) times daily as needed. For diarrhea     escitalopram 20 MG tablet  Commonly known as:  LEXAPRO  Take 20 mg by mouth daily.     levETIRAcetam 250 MG tablet  Commonly known as:  KEPPRA  Take 250 mg by mouth 2 (two) times daily.     levofloxacin 500 MG tablet  Commonly known as:  LEVAQUIN  Take 1 tablet (500 mg total) by mouth every other day.     magnesium oxide 400 MG tablet  Commonly known as:  MAG-OX  Take 400 mg by mouth 2 (two) times daily.     methylPREDNIsolone 4 MG tablet  Commonly known as:  MEDROL DOSPACK  follow package directions     NAMENDA XR 28 MG Cp24  Generic drug:  Memantine HCl ER  Take 28 mg by mouth daily.     pantoprazole  40 MG tablet  Commonly known as:  PROTONIX  Take 40 mg by mouth daily.     potassium chloride SA 20 MEQ tablet  Commonly known as:  K-DUR,KLOR-CON  Take 1 tablet (20 mEq total) by mouth daily.     rivastigmine 9.5 mg/24hr  Commonly known as:  EXELON  Place 9.5 mg onto the skin daily.     simethicone 80 MG chewable tablet  Commonly known as:  MYLICON  Chew 80 mg by mouth 3 (three) times daily.     torsemide 20 MG tablet  Commonly known as:  DEMADEX  Take 1 tablet (20 mg total) by mouth daily.     venlafaxine XR 37.5 MG 24 hr capsule  Commonly known as:  EFFEXOR-XR  Take 37.5 mg by mouth daily.        Discharged Condition: Improved    Consults: None  Significant Diagnostic Studies: Dg Chest 2 View  06/22/2013   CLINICAL DATA:  Chest pain with history of COPD and CHF  EXAM: CHEST  2 VIEW  COMPARISON:  PA and lateral chest of March 20, 2013  FINDINGS: The lungs remain hyperinflated. There  is stable blunting of the left lateral and posterior costophrenic angles. The cardiac silhouette remains enlarged. The pulmonary vascularity is not engorged. There is calcification in the aortic arch and descending thoracic aorta. The bony thorax is unremarkable.  IMPRESSION: COPD and chronic cardiomegaly. There is no acute cardiopulmonary disease.   Electronically Signed   By: David  Swazil[place]   On: 06/22/2013 11:47   Dg Lumbar Spine Complete  06/15/2013   CLINICAL DATA:  Low back pain post trauma  EXAM: LUMBAR SPINE - COMPLETE 4+ VIEW  COMPARISON:  May 15, 2011 abdominal ultrasound  FINDINGS: Frontal, lateral, spot lumbosacral lateral, and bilateral oblique views were obtained. There are 5 non-rib-bearing lumbar type vertebral bodies. There is levoscoliosis. There is no fracture or spondylolisthesis. There is moderate disc space narrowing at L1-2 and L3-4. There is mild disc space narrowing at L2-3 and L4-5. There is extensive atherosclerotic change in aorta. There is an apparent mid abdominal aortic  aneurysm.  IMPRESSION: Scoliosis and osteoarthritic change. No fracture or spondylolisthesis. There is atherosclerotic change in aorta. Evidence of abdominal aortic aneurysm. Note that an abdominal aortic aneurysm has been documented by previous ultrasound.   Electronically Signed   By: Bretta BangWilliam  Woodruff M.D.   On: 06/15/2013 17:58   Ct Head Wo Contrast  06/15/2013   CLINICAL DATA:  Headache and neck pain secondary to a fall.  EXAM: CT HEAD WITHOUT CONTRAST  CT CERVICAL SPINE WITHOUT CONTRAST  TECHNIQUE: Multidetector CT imaging of the head and cervical spine was performed following the standard protocol without intravenous contrast. Multiplanar CT image reconstructions of the cervical spine were also generated.  COMPARISON:  02/08/2010  FINDINGS: CT HEAD FINDINGS  No mass lesion. No midline shift. No acute hemorrhage or hematoma. No extra-axial fluid collections. No evidence of acute infarction. There is diffuse slight cerebral cortical atrophy with secondary ventricular dilatation. There has been no change since the prior exam.  No acute osseous abnormality.  CT CERVICAL SPINE FINDINGS  There is no acute fracture or subluxation or prevertebral soft tissue swelling.  The previous C2 fracture has completely healed. The right lateral masses of C2-3 C4 are completely fused.  There is a 1.5 mm spondylolisthesis of C4 on C5, unchanged with moderate right facet arthritis. There is a 3 mm retrolisthesis of C5 on C6 due to degenerative disc disease, also unchanged.  There is degenerative disc disease at C6-7, slightly progressed.  IMPRESSION: 1. No acute intracranial abnormality. 2. No acute abnormality of the cervical spine. Prior C2 fracture has healed.   Electronically Signed   By: Geanie CooleyJim  Maxwell M.D.   On: 06/15/2013 19:39   Ct Cervical Spine Wo Contrast  06/15/2013   CLINICAL DATA:  Headache and neck pain secondary to a fall.  EXAM: CT HEAD WITHOUT CONTRAST  CT CERVICAL SPINE WITHOUT CONTRAST  TECHNIQUE:  Multidetector CT imaging of the head and cervical spine was performed following the standard protocol without intravenous contrast. Multiplanar CT image reconstructions of the cervical spine were also generated.  COMPARISON:  02/08/2010  FINDINGS: CT HEAD FINDINGS  No mass lesion. No midline shift. No acute hemorrhage or hematoma. No extra-axial fluid collections. No evidence of acute infarction. There is diffuse slight cerebral cortical atrophy with secondary ventricular dilatation. There has been no change since the prior exam.  No acute osseous abnormality.  CT CERVICAL SPINE FINDINGS  There is no acute fracture or subluxation or prevertebral soft tissue swelling.  The previous C2 fracture has completely healed. The right lateral masses  of C2-3 C4 are completely fused.  There is a 1.5 mm spondylolisthesis of C4 on C5, unchanged with moderate right facet arthritis. There is a 3 mm retrolisthesis of C5 on C6 due to degenerative disc disease, also unchanged.  There is degenerative disc disease at C6-7, slightly progressed.  IMPRESSION: 1. No acute intracranial abnormality. 2. No acute abnormality of the cervical spine. Prior C2 fracture has healed.   Electronically Signed   By: Geanie Cooley M.D.   On: 06/15/2013 19:39    Lab Results: Basic Metabolic Panel:  Recent Labs  16/10/96 0607 06/25/13 0541  NA 134* 133*  K 3.3* 3.4*  CL 88* 88*  CO2 33* 33*  GLUCOSE 148* 133*  BUN 42* 53*  CREATININE 1.64* 1.80*  CALCIUM 9.4 9.0   Liver Function Tests:  Recent Labs  06/22/13 1144  AST 22  ALT 14  ALKPHOS 133*  BILITOT 0.6  PROT 7.3  ALBUMIN 3.6     CBC:  Recent Labs  06/22/13 1144 06/23/13 0612  WBC 7.9 8.4  NEUTROABS 5.1  --   HGB 13.9 13.8  HCT 41.5 41.0  MCV 80.3 79.8  PLT 226 217    Recent Results (from the past 240 hour(s))  URINE CULTURE     Status: None   Collection Time    06/22/13 12:06 PM      Result Value Ref Range Status   Specimen Description URINE,  CATHETERIZED   Final   Special Requests NONE   Final   Culture  Setup Time     Final   Value: 06/22/2013 18:15     Performed at [facility]     Final   Value: >=100,000 COLONIES/ML     Performed at [facility]   Culture     Final   Value: ENTEROCOCCUS SPECIES     Performed at [facility]   Report Status 06/24/2013 FINAL   Final   Organism ID, Bacteria ENTEROCOCCUS SPECIES   Final     Hospital Course: She came to the emergency department because of shortness of breath. She was evaluated and thought to have COPD exacerbation. She has multiple other medical problems. She was treated with intravenous antibiotics steroids inhaled bronchodilators and oxygen and improved. She was found to be hypoxic before discharge so she's going to need to go home on oxygen. By the time of discharge she was awake and alert much improved as far as her breathing is concerned.  Discharge Exam: Blood pressure 113/87, pulse 64, temperature 98.1 F (36.7 C), temperature source Oral, resp. rate 18, height 5\' 4"  (1.626 m), weight 63.957 kg (141 lb), SpO2 91.00%. She is awake and alert. Her chest is fairly clear. Her heart is regular.  Disposition: She will return to her assisted living facility. They can provide physical therapy. She will need oxygen at least temporarily. She will need basic metabolic profile on 06/27/2013      Discharge Instructions   Discharge patient    Complete by:  As directed   Please have a basic metabolic profile done on 06/28/2013     For home use only DME oxygen    Complete by:  As directed   Mode or (Route):  Nasal cannula  Liters per Minute:  2  Frequency:  Continuous (stationary and portable oxygen unit needed)  Oxygen conserving device:  No  Oxygen delivery system:  Gas  Signed: HAWKINS,EDWARD L   06/25/2013, 10:52 AM

## 2013-06-26 NOTE — Progress Notes (Signed)
Pt transported to main entrance via wheelchair by staff and [facility] staff member.  Pt has O2 tank to transport. Facility aware of need for O2.  Pt stable at this time.

## 2013-06-26 NOTE — Progress Notes (Signed)
Subjective: Patient feels better. She is going to be discharged home today. All discharge arrangement was done by Dr Luan Pulling yesterday.  Objective: Vital signs in last 24 hours: Temp:  [97.9 F (36.6 C)-98.6 F (37 C)] 97.9 F (36.6 C) (06/21 0427) Pulse Rate:  [74-83] 74 (06/21 0853) Resp:  [18] 18 (06/21 0427) BP: (104-125)/(65-68) 121/66 mmHg (06/21 0853) SpO2:  [88 %-100 %] 93 % (06/21 0853) Weight:  [63.005 kg (138 lb 14.4 oz)] 63.005 kg (138 lb 14.4 oz) (06/21 0427) Weight change: -0.953 kg (-2 lb 1.6 oz) Last BM Date: 06/25/13  Intake/Output from previous day: 06/20 0701 - 06/21 0700 In: 780 [P.O.:780] Out: 750 [Urine:750]  PHYSICAL EXAM General appearance: alert and no distress Resp: diminished breath sounds bilaterally and rhonchi bilaterally Cardio: S1, S2 normal GI: soft, non-tender; bowel sounds normal; no masses,  no organomegaly Extremities: extremities normal, atraumatic, no cyanosis or edema  Lab Results:  Results for orders placed during the hospital encounter of 06/22/13 (from the past 48 hour(s))  BASIC METABOLIC PANEL     Status: Abnormal   Collection Time    06/25/13  5:41 AM      Result Value Ref Range   Sodium 133 (*) 137 - 147 mEq/L   Potassium 3.4 (*) 3.7 - 5.3 mEq/L   Chloride 88 (*) 96 - 112 mEq/L   CO2 33 (*) 19 - 32 mEq/L   Glucose, Bld 133 (*) 70 - 99 mg/dL   BUN 53 (*) 6 - 23 mg/dL   Creatinine, Ser 1.80 (*) 0.50 - 1.10 mg/dL   Calcium 9.0  8.4 - 10.5 mg/dL   GFR calc non Af Amer 24 (*) >90 mL/min   GFR calc Af Amer 28 (*) >90 mL/min   Comment: (NOTE)     The eGFR has been calculated using the CKD EPI equation.     This calculation has not been validated in all clinical situations.     eGFR's persistently <90 mL/min signify possible Chronic Kidney     Disease.    ABGS No results found for this basename: PHART, PCO2, PO2ART, TCO2, HCO3,  in the last 72 hours CULTURES Recent Results (from the past 240 hour(s))  URINE CULTURE      Status: None   Collection Time    06/22/13 12:06 PM      Result Value Ref Range Status   Specimen Description URINE, CATHETERIZED   Final   Special Requests NONE   Final   Culture  Setup Time     Final   Value: 06/22/2013 18:15     Performed at [place]     Final   Value: >=100,000 COLONIES/ML     Performed at [facility]   Culture     Final   Value: ENTEROCOCCUS SPECIES     Performed at [facility]   Report Status 06/24/2013 FINAL   Final   Organism ID, Bacteria ENTEROCOCCUS SPECIES   Final   Studies/Results: No results found.  Medications: I have reviewed the patient's current medications.  Assesment: Principal Problem:   COPD exacerbation Active Problems:   Chronic systolic congestive heart failure   Chronic renal failure   Dementia   Hypokalemia    Plan: Medications reviewed Patient is stable for discharge Continue current medication as per Dr.hawkins recommendations.    LOS: 4 days   FANTA,TESFAYE 06/26/2013, 10:36 AM

## 2013-06-26 NOTE — Progress Notes (Signed)
Spoke with Mare LoanCecilia at [facility], patient ok to return today.  FL2 completed.

## 2013-06-29 NOTE — Discharge Summary (Signed)
NAMCaryl Never:  Melissa [name], Melissa [name]                ACCOUNT NO.:  [id number]  MEDICAL RECORD NO.:  [id number]  LOCATION:  A326                          FACILITY:  [facility]  PHYSICIAN:  Edward L. Juanetta GoslingHawkins, M.D.DATE OF BIRTH:  1925/12/31  DATE OF ADMISSION:  06/22/2013 DATE OF DISCHARGE:  06/21/2015LH                              DISCHARGE SUMMARY   ADDENDUM:  DISCHARGE DIAGNOSIS:  Acute hypoxic respiratory failure.     Edward L. Juanetta GoslingHawkins, M.D.     ELH/MEDQ  D:  06/29/2013  T:  06/29/2013  Job:  [id number]

## 2013-07-16 ENCOUNTER — Encounter (HOSPITAL_COMMUNITY): Payer: Self-pay | Admitting: Emergency Medicine

## 2013-07-16 ENCOUNTER — Emergency Department (HOSPITAL_COMMUNITY): Payer: Medicare Other

## 2013-07-16 ENCOUNTER — Emergency Department (HOSPITAL_COMMUNITY)
Admission: EM | Admit: 2013-07-16 | Discharge: 2013-07-16 | Disposition: A | Discharge status: Home / Self Care | Payer: Medicare Other | Attending: Emergency Medicine | Admitting: Emergency Medicine

## 2013-07-16 DIAGNOSIS — F410 Panic disorder [episodic paroxysmal anxiety] without agoraphobia: Secondary | ICD-10-CM | POA: Insufficient documentation

## 2013-07-16 DIAGNOSIS — S99919A Unspecified injury of unspecified ankle, initial encounter: Principal | ICD-10-CM

## 2013-07-16 DIAGNOSIS — F329 Major depressive disorder, single episode, unspecified: Secondary | ICD-10-CM | POA: Insufficient documentation

## 2013-07-16 DIAGNOSIS — I10 Essential (primary) hypertension: Secondary | ICD-10-CM | POA: Insufficient documentation

## 2013-07-16 DIAGNOSIS — J4489 Other specified chronic obstructive pulmonary disease: Secondary | ICD-10-CM | POA: Insufficient documentation

## 2013-07-16 DIAGNOSIS — Z79899 Other long term (current) drug therapy: Secondary | ICD-10-CM | POA: Insufficient documentation

## 2013-07-16 DIAGNOSIS — I509 Heart failure, unspecified: Secondary | ICD-10-CM | POA: Insufficient documentation

## 2013-07-16 DIAGNOSIS — F0391 Unspecified dementia with behavioral disturbance: Secondary | ICD-10-CM

## 2013-07-16 DIAGNOSIS — M129 Arthropathy, unspecified: Secondary | ICD-10-CM | POA: Insufficient documentation

## 2013-07-16 DIAGNOSIS — E119 Type 2 diabetes mellitus without complications: Secondary | ICD-10-CM | POA: Insufficient documentation

## 2013-07-16 DIAGNOSIS — S8990XA Unspecified injury of unspecified lower leg, initial encounter: Secondary | ICD-10-CM | POA: Insufficient documentation

## 2013-07-16 DIAGNOSIS — Z87891 Personal history of nicotine dependence: Secondary | ICD-10-CM | POA: Insufficient documentation

## 2013-07-16 DIAGNOSIS — F3289 Other specified depressive episodes: Secondary | ICD-10-CM | POA: Insufficient documentation

## 2013-07-16 DIAGNOSIS — Y929 Unspecified place or not applicable: Secondary | ICD-10-CM | POA: Insufficient documentation

## 2013-07-16 DIAGNOSIS — M25571 Pain in right ankle and joints of right foot: Secondary | ICD-10-CM

## 2013-07-16 DIAGNOSIS — W050XXA Fall from non-moving wheelchair, initial encounter: Secondary | ICD-10-CM | POA: Insufficient documentation

## 2013-07-16 DIAGNOSIS — S99929A Unspecified injury of unspecified foot, initial encounter: Principal | ICD-10-CM

## 2013-07-16 DIAGNOSIS — F03918 Unspecified dementia, unspecified severity, with other behavioral disturbance: Secondary | ICD-10-CM | POA: Insufficient documentation

## 2013-07-16 DIAGNOSIS — Y9389 Activity, other specified: Secondary | ICD-10-CM | POA: Insufficient documentation

## 2013-07-16 DIAGNOSIS — W19XXXA Unspecified fall, initial encounter: Secondary | ICD-10-CM

## 2013-07-16 DIAGNOSIS — J449 Chronic obstructive pulmonary disease, unspecified: Secondary | ICD-10-CM | POA: Insufficient documentation

## 2013-07-16 HISTORY — DX: Unspecified dementia, unspecified severity, without behavioral disturbance, psychotic disturbance, mood disturbance, and anxiety: F03.90

## 2013-07-16 NOTE — ED Notes (Signed)
Pt comes from [facility] via EMS after a fall. Pt c/o lower back pain, right hip/leg/ankle pain. Pt states she was attempting to stand up from her wheelchair when fall occurred. Pt uses wheelchair and walker. Pt able to move right leg.

## 2013-07-16 NOTE — ED Provider Notes (Signed)
CSN: [id number]     Arrival date & time 07/16/13  1250 History   First MD Initiated Contact with Patient 07/16/13 1300     This chart was scribed for Ward Givens, MD by Arlan Organ, ED Scribe. This patient was seen in room APA07/APA07 and the patient's care was started 1:35 PM.   Chief Complaint  Patient presents with  . Fall   The history is provided by the patient. No language interpreter was used.   LEVEL 5 CAVEAT FOR DEMENTIA  HPI Comments: Melissa [name] is a 80 y.o. Female with a PMHx of COPD, CHF, HTN, DM, and Gout brought in by EMS from [facility] who presents to the Emergency Department complaining of a fall that occurred just prior to arrival. Last known well this morning before fall. Pt states she was attempting to transfer from her walker to her wheelchair and accidentally fell. She thinks she may have hit her head. She is concerned because she has had a prior neck fracture and sometimes her neck hurts. She states her right leg hurts, and has trouble pinning down where the pain is located. Most consistantly she is c/o her right ankle as painful. She denies nausea, vomiting, headache, or loss of consciousness. Pt currently uses a wheelchair and a walker at baseline. She has no pertinent past medical history. No other concerns this visit.   She is followed by Dr. Kari Baars PCP   Past Medical History  Diagnosis Date  . COPD (chronic obstructive pulmonary disease)   . CHF (congestive heart failure)   . Delirium   . Metabolic alkalosis   . Hypertension   . Diabetes mellitus   . Anxiety   . Depression   . Panic attacks   . Chronic diarrhea of unknown origin   . Gout   . Arthritis   . Dementia    Past Surgical History  Procedure Laterality Date  . Inguinal hernia repair      right   History reviewed. No pertinent family history. History  Substance Use Topics  . Smoking status: Former Games developer  . Smokeless tobacco: Not on file  . Alcohol Use: No   Lives in a  nursing home Uses a wheelchair and a walker with difficulty  OB History   Grav Para Term Preterm Abortions TAB SAB Ect Mult Living                 Review of Systems  Unable to perform ROS: Dementia      Allergies  Shellfish allergy  Home Medications   Prior to Admission medications   Medication Sig Start Date End Date Taking? Authorizing Provider  amLODipine (NORVASC) 10 MG tablet Take 10 mg by mouth daily.     Yes Historical Provider, MD  carvedilol (COREG) 12.5 MG tablet Take 12.5 mg by mouth 2 (two) times daily.   Yes Historical Provider, MD  escitalopram (LEXAPRO) 20 MG tablet Take 20 mg by mouth daily.   Yes Historical Provider, MD  levETIRAcetam (KEPPRA) 250 MG tablet Take 250 mg by mouth 2 (two) times daily.     Yes Historical Provider, MD  magnesium oxide (MAG-OX) 400 MG tablet Take 400 mg by mouth 2 (two) times daily.   Yes Historical Provider, MD  Memantine HCl ER (NAMENDA XR) 28 MG CP24 Take 28 mg by mouth daily.   Yes Historical Provider, MD  pantoprazole (PROTONIX) 40 MG tablet Take 40 mg by mouth daily.   Yes Historical Provider, MD  potassium chloride (K-DUR,KLOR-CON) 20 MEQ tablet Take 1 tablet (20 mEq total) by mouth daily. 06/25/13  Yes Fredirick MaudlinEdward L Hawkins, MD  rivastigmine (EXELON) 9.5 mg/24hr Place 9.5 mg onto the skin daily.   Yes Historical Provider, MD  simethicone (MYLICON) 80 MG chewable tablet Chew 80 mg by mouth 3 (three) times daily.   Yes Historical Provider, MD  torsemide (DEMADEX) 20 MG tablet Take 1 tablet (20 mg total) by mouth daily. 06/25/13  Yes Fredirick MaudlinEdward L Hawkins, MD  venlafaxine (EFFEXOR-XR) 37.5 MG 24 hr capsule Take 37.5 mg by mouth daily.     Yes Historical Provider, MD  acetaminophen (TYLENOL) 500 MG tablet Take 500 mg by mouth every 4 (four) hours as needed for mild pain or fever.    Historical Provider, MD  alum & mag hydroxide-simeth (MAALOX/MYLANTA) 200-200-20 MG/5ML suspension Take 10 mLs by mouth every 4 (four) hours as needed. For  indigestion    Historical Provider, MD  benzonatate (TESSALON) 200 MG capsule Take 200 mg by mouth 3 (three) times daily.    Historical Provider, MD  diphenoxylate-atropine (LOMOTIL) 2.5-0.025 MG per tablet Take 1 tablet by mouth 4 (four) times daily as needed. For diarrhea    Historical Provider, MD   Triage Vitals: BP 132/82  Pulse 75  Temp(Src) 98.4 F (36.9 C) (Oral)  Resp 16  Ht 5' (1.524 m)  Wt 140 lb (63.504 kg)  BMI 27.34 kg/m2  SpO2 93%   Vital signs normal    Physical Exam  Nursing note and vitals reviewed. Constitutional: She is oriented to person, place, and time. She appears well-developed and well-nourished.  Non-toxic appearance. She does not appear ill. No distress.  Pleasant elderly female  HENT:  Head: Normocephalic and atraumatic.  Right Ear: External ear normal.  Left Ear: External ear normal.  Nose: Nose normal. No mucosal edema or rhinorrhea.  Mouth/Throat: Oropharynx is clear and moist and mucous membranes are normal. No dental abscesses or uvula swelling.  Head is nontender  Eyes: Conjunctivae and EOM are normal. Pupils are equal, round, and reactive to light.  Neck: Normal range of motion and full passive range of motion without pain. Neck supple.  Her neck was palpated, she questionably has some discomfort with certain ways she moves her head.  Cardiovascular: Normal rate, regular rhythm and normal heart sounds.  Exam reveals no gallop and no friction rub.   No murmur heard. Pulmonary/Chest: Effort normal and breath sounds normal. No respiratory distress. She has no wheezes. She has no rhonchi. She has no rales. She exhibits no tenderness and no crepitus.  Abdominal: Soft. Normal appearance and bowel sounds are normal. She exhibits no distension. There is no tenderness. There is no rebound and no guarding.  Musculoskeletal: Normal range of motion. She exhibits tenderness. She exhibits no edema.  Head non tender Neck appears to be non tender Moving head  freely She was flexing and extending all major joints without any discomfort of deformity Moves all extremities well. No obvious swelling seen in any of her joints. Her exam changes from having pain in her hip or her knee or her ankle on the right  Neurological: She is alert and oriented to person, place, and time. She has normal strength. No cranial nerve deficit.  Skin: Skin is warm, dry and intact. No rash noted. No erythema. No pallor.  Psychiatric: She has a normal mood and affect. Her speech is normal and behavior is normal. Her mood appears not anxious.    ED Course  Procedures (including critical care time)  DIAGNOSTIC STUDIES: Oxygen Saturation is 93% on RA, low by my interpretation.    COORDINATION OF CARE: 1:37 PM- Will order CT head w/o contrast, CT cervical spine, DG Hip complete, DG knee complete 4 views, and DG ankle complete R. Discussed treatment plan with pt at bedside and pt agreed to plan.    When I went back to check patient after her scans had resulted patient is getting more agitated getting more confused. She was able to be stood up by nursing staff. Although she stood with difficulty it did not cause pain in her right lower leg. She admitted when I first examined her that she had difficulty using a walker.   Labs Review Labs Reviewed - No data to display  Imaging Review Dg Hip Complete Right  07/16/2013   CLINICAL DATA:  Fall.  EXAM: RIGHT HIP - COMPLETE 2+ VIEW  COMPARISON:  None.  FINDINGS: No acute bony or joint abnormality identified. Vascular calcification.  IMPRESSION: No acute abnormality.  Degenerative changes both hips.   Electronically Signed   By: Maisie Fus  Register   On: 07/16/2013 15:02   Dg Ankle Complete Right  07/16/2013   CLINICAL DATA:  Fall, leg pain  EXAM: RIGHT ANKLE - COMPLETE 3+ VIEW  COMPARISON:  None.  FINDINGS: Ankle mortise intact. The talar dome is normal. No malleolar fracture. The calcaneus is normal. olar fracture. The calcaneus is  normal. Diffuse osteopenia  IMPRESSION: 1. No evidence ankle fracture. 2. Osteopenia   Electronically Signed   By: Genevive Bi M.D.   On: 07/16/2013 15:05   Ct Head Wo Contrast  Ct Cervical Spine Wo Contrast  07/16/2013   CLINICAL DATA:  Status post fall.  Altered level of consciousness.  EXAM: CT HEAD WITHOUT CONTRAST  CT CERVICAL SPINE WITHOUT CONTRAST  TECHNIQUE: Multidetector CT imaging of the head and cervical spine was performed following the standard protocol without intravenous contrast. Multiplanar CT image reconstructions of the cervical spine were also generated.  COMPARISON:  Head and cervical spine CT scan 06/15/2013.  FINDINGS: CT HEAD FINDINGS  Cortical atrophy and chronic microvascular ischemic change are again seen. There is no evidence of acute intracranial abnormality including hemorrhage, infarct, mass lesion, mass effect, midline shift or abnormal extra-axial fluid collection. Mild, scattered ethmoid air cell disease and mucosal thickening in the sphenoid sinuses, worse on the left, is unchanged. Mastoid air cells are clear. The calvarium is intact. Atherosclerosis is noted.  CT CERVICAL SPINE FINDINGS  Again seen is a healed C2 fracture. No acute fracture is identified. Reversal of the normal cervical lordosis is noted. Multilevel spondylosis is again seen. Ankylosis of the C2-C4 facets is again seen. No new abnormality is identified. Lung apices are clear.  IMPRESSION: No acute finding head or cervical spine. Stable compared to prior exam.   Electronically Signed   By: Drusilla Kanner M.D.   On: 07/16/2013 14:44   Dg Knee Complete 4 Views Right  07/16/2013   CLINICAL DATA:  Status post fall.  Right knee pain.  EXAM: RIGHT KNEE - COMPLETE 4+ VIEW  COMPARISON:  Plain films right knee 08/30/2009.  FINDINGS: No acute bony or joint abnormality is identified. There is no joint effusion. Mild degenerative change is noted. Atherosclerosis is identified.  IMPRESSION: No acute finding.    Electronically Signed   By: Drusilla Kanner M.D.   On: 07/16/2013 15:02     EKG Interpretation None      MDM  Final diagnoses:  Fall, initial encounter  Pain, ankle, right  Dementia, with behavioral disturbance   Plan discharge  Devoria Albe, MD, FACEP   I personally performed the services described in this documentation, which was scribed in my presence. The recorded information has been reviewed and considered.  Devoria Albe, MD, Armando Gang   Ward Givens, MD 07/16/13 [phone]

## 2013-07-16 NOTE — Discharge Instructions (Signed)
We did a CT scan of your head and your neck. You neck fracture has healed. Your xrays of your right hip, knee and ankle today do not show a fracture.  Recheck if you continue to have pain.

## 2014-01-01 ENCOUNTER — Emergency Department (HOSPITAL_COMMUNITY): Payer: Medicare Other

## 2014-01-01 ENCOUNTER — Encounter (HOSPITAL_COMMUNITY): Payer: Self-pay | Admitting: Emergency Medicine

## 2014-01-01 ENCOUNTER — Emergency Department (HOSPITAL_COMMUNITY)
Admission: EM | Admit: 2014-01-01 | Discharge: 2014-01-01 | Disposition: A | Discharge status: Home / Self Care | Payer: Medicare Other | Attending: Emergency Medicine | Admitting: Emergency Medicine

## 2014-01-01 DIAGNOSIS — M1990 Unspecified osteoarthritis, unspecified site: Secondary | ICD-10-CM | POA: Insufficient documentation

## 2014-01-01 DIAGNOSIS — F329 Major depressive disorder, single episode, unspecified: Secondary | ICD-10-CM | POA: Diagnosis not present

## 2014-01-01 DIAGNOSIS — I509 Heart failure, unspecified: Secondary | ICD-10-CM | POA: Diagnosis not present

## 2014-01-01 DIAGNOSIS — F410 Panic disorder [episodic paroxysmal anxiety] without agoraphobia: Secondary | ICD-10-CM | POA: Diagnosis not present

## 2014-01-01 DIAGNOSIS — R079 Chest pain, unspecified: Secondary | ICD-10-CM | POA: Insufficient documentation

## 2014-01-01 DIAGNOSIS — J449 Chronic obstructive pulmonary disease, unspecified: Secondary | ICD-10-CM | POA: Insufficient documentation

## 2014-01-01 DIAGNOSIS — I119 Hypertensive heart disease without heart failure: Secondary | ICD-10-CM | POA: Insufficient documentation

## 2014-01-01 DIAGNOSIS — Z87891 Personal history of nicotine dependence: Secondary | ICD-10-CM | POA: Insufficient documentation

## 2014-01-01 DIAGNOSIS — F0390 Unspecified dementia without behavioral disturbance: Secondary | ICD-10-CM | POA: Diagnosis not present

## 2014-01-01 DIAGNOSIS — I10 Essential (primary) hypertension: Secondary | ICD-10-CM | POA: Diagnosis not present

## 2014-01-01 DIAGNOSIS — Z79899 Other long term (current) drug therapy: Secondary | ICD-10-CM | POA: Diagnosis not present

## 2014-01-01 LAB — URINE MICROSCOPIC-ADD ON

## 2014-01-01 LAB — BASIC METABOLIC PANEL
Anion gap: 7 (ref 5–15)
BUN: 18 mg/dL (ref 6–23)
CALCIUM: 8.7 mg/dL (ref 8.4–10.5)
CO2: 26 mmol/L (ref 19–32)
Chloride: 106 mEq/L (ref 96–112)
Creatinine, Ser: 1.22 mg/dL — ABNORMAL HIGH (ref 0.50–1.10)
GFR calc Af Amer: 45 mL/min — ABNORMAL LOW (ref >90)
GFR, EST NON AFRICAN AMERICAN: 39 mL/min — AB (ref >90)
GLUCOSE: 106 mg/dL — AB (ref 70–99)
Potassium: 3.6 mmol/L (ref 3.5–5.1)
Sodium: 139 mmol/L (ref 135–145)

## 2014-01-01 LAB — URINALYSIS, ROUTINE W REFLEX MICROSCOPIC
BILIRUBIN URINE: NEGATIVE
GLUCOSE, UA: NEGATIVE mg/dL
Hgb urine dipstick: NEGATIVE
Ketones, ur: NEGATIVE mg/dL
Leukocytes, UA: NEGATIVE
Nitrite: NEGATIVE
PROTEIN: 100 mg/dL — AB
SPECIFIC GRAVITY, URINE: 1.015 (ref 1.005–1.030)
Urobilinogen, UA: 0.2 mg/dL (ref 0.0–1.0)
pH: 7 (ref 5.0–8.0)

## 2014-01-01 LAB — I-STAT TROPONIN, ED
TROPONIN I, POC: 0.02 ng/mL (ref 0.00–0.08)
Troponin i, poc: 0.01 ng/mL (ref 0.00–0.08)

## 2014-01-01 LAB — CBC WITH DIFFERENTIAL/PLATELET
BASOS ABS: 0 10*3/uL (ref 0.0–0.1)
Basophils Relative: 0 % (ref 0–1)
EOS PCT: 1 % (ref 0–5)
Eosinophils Absolute: 0.2 10*3/uL (ref 0.0–0.7)
HCT: 40.9 % (ref 36.0–46.0)
Hemoglobin: 13 g/dL (ref 12.0–15.0)
LYMPHS ABS: 1.1 10*3/uL (ref 0.7–4.0)
Lymphocytes Relative: 8 % — ABNORMAL LOW (ref 12–46)
MCH: 26.2 pg (ref 26.0–34.0)
MCHC: 31.8 g/dL (ref 30.0–36.0)
MCV: 82.3 fL (ref 78.0–100.0)
Monocytes Absolute: 1 10*3/uL (ref 0.1–1.0)
Monocytes Relative: 7 % (ref 3–12)
Neutro Abs: 12.4 10*3/uL — ABNORMAL HIGH (ref 1.7–7.7)
Neutrophils Relative %: 84 % — ABNORMAL HIGH (ref 43–77)
Platelets: 190 10*3/uL (ref 150–400)
RBC: 4.97 MIL/uL (ref 3.87–5.11)
RDW: 14.8 % (ref 11.5–15.5)
WBC: 14.7 10*3/uL — ABNORMAL HIGH (ref 4.0–10.5)

## 2014-01-01 LAB — D-DIMER, QUANTITATIVE: D-Dimer, Quant: 2.77 ug/mL-FEU — ABNORMAL HIGH (ref 0.00–0.48)

## 2014-01-01 MED ORDER — MORPHINE SULFATE 4 MG/ML IJ SOLN
4.0000 mg | Freq: Once | INTRAMUSCULAR | Status: AC
  Administered 2014-01-01: 4 mg via INTRAVENOUS
  Filled 2014-01-01: qty 1

## 2014-01-01 MED ORDER — IPRATROPIUM-ALBUTEROL 0.5-2.5 (3) MG/3ML IN SOLN
3.0000 mL | Freq: Once | RESPIRATORY_TRACT | Status: AC
  Administered 2014-01-01: 3 mL via RESPIRATORY_TRACT
  Filled 2014-01-01: qty 3

## 2014-01-01 MED ORDER — IOHEXOL 350 MG/ML SOLN
80.0000 mL | Freq: Once | INTRAVENOUS | Status: AC | PRN
  Administered 2014-01-01: 80 mL via INTRAVENOUS

## 2014-01-01 NOTE — ED Notes (Signed)
Patient arrives via EMS from [facility] with c/o chest pain, aching in nature, worse with deep breathing and movement. NSR on monitor, EMS EKG negative, no ectopy noted on transport to ED. Patient arrives alert/oriented. Anxious. Denies injury or fall.

## 2014-01-01 NOTE — ED Provider Notes (Signed)
CSN: [id number]     Arrival date & time 01/01/14  0845 History  This chart was scribed for Joya Gaskins, MD by Ronney Lion, ED Scribe. This patient was seen in room APA12/APA12 and the patient's care was started at 9:21 AM.    Chief Complaint  Patient presents with  . Chest Pain   Patient is a 80 y.o. female presenting with chest pain. The history is provided by the patient. No language interpreter was used.  Chest Pain Pain location:  R chest Pain radiates to the back: yes   Pain severity:  Severe Onset quality:  Sudden Duration:  1 hour Timing:  Constant Chronicity:  New Context: at rest   Relieved by:  None tried Worsened by:  Deep breathing Ineffective treatments:  None tried Associated symptoms: back pain   Associated symptoms: no cough, no fever and not vomiting   Risk factors: no smoking (currently)     HPI Comments: Melissa [name] is a 80 y.o. female who presents to the Emergency Department brought in by ambulance complaining of constant, severe, right-sided chest pain radiating to her back that began this morning when she woke up. No prodrome noted; patient went to bed feeling well. Breathing exacerbates her pain. She states that she is usually on home O2. Patient denies cough, fever, and vomiting. She denies current smoking.  Past Medical History  Diagnosis Date  . COPD (chronic obstructive pulmonary disease)   . CHF (congestive heart failure)   . Delirium   . Metabolic alkalosis   . Hypertension   . Diabetes mellitus   . Anxiety   . Depression   . Panic attacks   . Chronic diarrhea of unknown origin   . Gout   . Arthritis   . Dementia    Past Surgical History  Procedure Laterality Date  . Inguinal hernia repair      right   No family history on file. History  Substance Use Topics  . Smoking status: Former Games developer  . Smokeless tobacco: Not on file  . Alcohol Use: No   OB History    No data available     Review of Systems  Constitutional:  Negative for fever.  Respiratory: Negative for cough.   Cardiovascular: Positive for chest pain.  Gastrointestinal: Negative for vomiting.  Musculoskeletal: Positive for back pain.  All other systems reviewed and are negative.     Allergies  Shellfish allergy  Home Medications   Prior to Admission medications   Medication Sig Start Date End Date Taking? Authorizing Provider  alum & mag hydroxide-simeth (MAALOX/MYLANTA) 200-200-20 MG/5ML suspension Take 10 mLs by mouth every 4 (four) hours as needed. For indigestion   Yes Historical Provider, MD  amLODipine (NORVASC) 10 MG tablet Take 10 mg by mouth daily.     Yes Historical Provider, MD  benzonatate (TESSALON) 200 MG capsule Take 200 mg by mouth 3 (three) times daily as needed for cough.    Yes Historical Provider, MD  carvedilol (COREG) 12.5 MG tablet Take 12.5 mg by mouth 2 (two) times daily.   Yes Historical Provider, MD  escitalopram (LEXAPRO) 20 MG tablet Take 20 mg by mouth daily.   Yes Historical Provider, MD  levETIRAcetam (KEPPRA) 250 MG tablet Take 250 mg by mouth 2 (two) times daily.     Yes Historical Provider, MD  magnesium oxide (MAG-OX) 400 MG tablet Take 400 mg by mouth 2 (two) times daily.   Yes Historical Provider, MD  Memantine HCl ER (  NAMENDA XR) 28 MG CP24 Take 28 mg by mouth daily.   Yes Historical Provider, MD  pantoprazole (PROTONIX) 40 MG tablet Take 40 mg by mouth daily.   Yes Historical Provider, MD  potassium chloride (K-DUR,KLOR-CON) 20 MEQ tablet Take 1 tablet (20 mEq total) by mouth daily. 06/25/13  Yes Fredirick MaudlinEdward L Hawkins, MD  rivastigmine (EXELON) 4.5 MG capsule Take 4.5 mg by mouth daily.   Yes Historical Provider, MD  simethicone (MYLICON) 80 MG chewable tablet Chew 80 mg by mouth 3 (three) times daily.   Yes Historical Provider, MD  torsemide (DEMADEX) 100 MG tablet Take 50 mg by mouth daily.   Yes Historical Provider, MD  traMADol (ULTRAM) 50 MG tablet Take 50 mg by mouth 4 (four) times daily as needed  for moderate pain.   Yes Historical Provider, MD  venlafaxine (EFFEXOR-XR) 37.5 MG 24 hr capsule Take 37.5 mg by mouth daily.     Yes Historical Provider, MD  torsemide (DEMADEX) 20 MG tablet Take 1 tablet (20 mg total) by mouth daily. Patient not taking: Reported on 01/01/2014 06/25/13   Fredirick MaudlinEdward L Hawkins, MD   BP 132/65 mmHg  Pulse 72  Temp(Src) 98.4 F (36.9 C) (Oral)  Resp 22  Ht 5\' 4"  (1.626 m)  Wt 140 lb (63.504 kg)  BMI 24.02 kg/m2  SpO2 90% Physical Exam  Nursing note and vitals reviewed.   CONSTITUTIONAL: Well developed/well nourished HEAD: Normocephalic/atraumatic EYES: EOMI/PERRL ENMT: Mucous membranes moist NECK: supple no meningeal signs SPINE/BACK:entire spine nontender CV: S1/S2 noted, no murmurs/rubs/gallops noted LUNGS: Decreased breath sounds bilaterally; coarse breath sounds bilaterally; diffuse right-sided chest wall tenderness without bruising ABDOMEN: soft, nontender, no rebound or guarding, bowel sounds noted throughout abdomen GU:no cva tenderness NEURO: Pt is awake/alert/appropriate, moves all extremitiesx4.  No facial droop.   EXTREMITIES: pulses normal/equal, full ROM SKIN: warm, color normal PSYCH: no abnormalities of mood noted, alert and oriented to situation  ED Course  Procedures   DIAGNOSTIC STUDIES: Oxygen Saturation is 90% on 2 L O2 [place], adequate by my interpretation.    COORDINATION OF CARE: 9:22 AM - Discussed treatment plan with pt at bedside which includes pain medication and CXR and pt agreed to plan.  Pt still with right sided CP, mostly pleuritic with some tenderness with palpation She is a poor historian, but denies fall/injury.  No bruising noted to chest Will proceed with further testing   Ct chest negative for acute disease Pt improved She is eating a meal Repeat ekg/troponin unremarkable She also had some tenderness on palpation, I doubt occult ACS at this time Nursing called facility and they reported they thought she  had indigestion  she is on home O2 and is in no distress  Labs Review Labs Reviewed  CBC WITH DIFFERENTIAL - Abnormal; Notable for the following:    WBC 14.7 (*)    Neutrophils Relative % 84 (*)    Neutro Abs 12.4 (*)    Lymphocytes Relative 8 (*)    All other components within normal limits  BASIC METABOLIC PANEL - Abnormal; Notable for the following:    Glucose, Bld 106 (*)    Creatinine, Ser 1.22 (*)    GFR calc non Af Amer 39 (*)    GFR calc Af Amer 45 (*)    All other components within normal limits  D-DIMER, QUANTITATIVE - Abnormal; Notable for the following:    D-Dimer, Quant 2.77 (*)    All other components within normal limits  URINALYSIS, ROUTINE W  REFLEX MICROSCOPIC - Abnormal; Notable for the following:    Protein, ur 100 (*)    All other components within normal limits  URINE MICROSCOPIC-ADD ON  I-STAT TROPOININ, ED  I-STAT TROPOININ, ED    Imaging Review Ct Angio Chest Pe W/cm &/or Wo Cm  01/01/2014   CLINICAL DATA:  Chest pain.  EXAM: CT ANGIOGRAPHY CHEST WITH CONTRAST  TECHNIQUE: Multidetector CT imaging of the chest was performed using the standard protocol during bolus administration of intravenous contrast. Multiplanar CT image reconstructions and MIPs were obtained to evaluate the vascular anatomy.  CONTRAST:  80mL OMNIPAQUE IOHEXOL 350 MG/ML SOLN  COMPARISON:  CT 08/30/2009 and recent chest x-ray today.  FINDINGS: Lungs are adequately inflated and demonstrate a stable 2 mm peripheral nodule over the right upper lobe as well as a stable 4.7 mm nodule over the left upper lobe. There is left basilar consolidation likely atelectasis with tiny amount of left pleural fluid. There is mild dependent right basilar atelectasis with tiny amount of pleural fluid.  There is mild cardiomegaly. There is mild calcified plaque over the left main and left anterior descending coronary arteries. There is calcified plaque over the thoracic aorta. Pulmonary arterial system is normal  without evidence of emboli. There is no mediastinal, hilar or axillary adenopathy. Possible small hiatal hernia.  Images through the upper abdomen demonstrate a 2.1 cm left adrenal adenoma unchanged. There is left renal atrophy. Remainder of the exam is unchanged.  Review of the MIP images confirms the above findings.  IMPRESSION: No evidence of pulmonary embolism.  Consolidation left base likely atelectasis although cannot completely exclude infection. Mild right basilar atelectasis. Tiny amount of bilateral pleural fluid.  Cardiomegaly with mild atherosclerotic coronary artery disease.  Stable 2.1 cm left adrenal adenoma.   Electronically Signed   By: Elberta Fortis M.D.   On: 01/01/2014 12:46   Dg Chest Portable 1 View  01/01/2014   CLINICAL DATA:  Chest and back pain as well as neck pain.  EXAM: PORTABLE CHEST - 1 VIEW  COMPARISON:  06/22/2013  FINDINGS: Patient is slightly rotated to the left. Lungs are adequately inflated with minimal prominence of the perihilar markings likely mild vascular congestion. Left base is difficult to evaluate on this exam as cannot exclude small effusions/ atelectasis or infection. Mild cardiomegaly unchanged. Calcified plaque is present over the aortic arch.  IMPRESSION: Suggestion mild vascular congestion.  Left base difficult to evaluate on this exam as cannot exclude small effusions/atelectasis versus infection.   Electronically Signed   By: Elberta Fortis M.D.   On: 01/01/2014 10:17     EKG Interpretation   Date/Time:  Sunday January 01 2014 13:52:02 EST Ventricular Rate:  80 PR Interval:  160 QRS Duration: 92 QT Interval:  431 QTC Calculation: 497 R Axis:   -34 Text Interpretation:  Sinus rhythm Ventricular premature complex Left axis  deviation Abnormal R-wave progression, early transition Borderline  prolonged QT interval No significant change since last tracing Confirmed  by Bebe Shaggy  MD, Clois Treanor ([id number]) on 01/01/2014 1:57:00 PM     Medications   morphine 4 MG/ML injection 4 mg (4 mg Intravenous Given 01/01/14 0943)  ipratropium-albuterol (DUONEB) 0.5-2.5 (3) MG/3ML nebulizer solution 3 mL (3 mLs Nebulization Given 01/01/14 1048)  iohexol (OMNIPAQUE) 350 MG/ML injection 80 mL (80 mLs Intravenous Contrast Given 01/01/14 1155)    MDM   Final diagnoses:  Chest pain  Chest pain, unspecified chest pain type    Nursing notes including past medical history  and social history reviewed and considered in documentation xrays/imaging reviewed by myself and considered during evaluation Labs/vital reviewed myself and considered during evaluation   I personally performed the services described in this documentation, which was scribed in my presence. The recorded information has been reviewed and is accurate.      Joya Gaskinsonald W Tyronn Golda, MD 01/01/14 [phone]

## 2014-01-01 NOTE — Discharge Instructions (Signed)

## 2014-05-18 ENCOUNTER — Ambulatory Visit (INDEPENDENT_AMBULATORY_CARE_PROVIDER_SITE_OTHER): Payer: Medicare Other | Admitting: Otolaryngology

## 2014-06-22 ENCOUNTER — Ambulatory Visit (INDEPENDENT_AMBULATORY_CARE_PROVIDER_SITE_OTHER): Payer: Medicare Other | Admitting: Otolaryngology

## 2014-06-22 DIAGNOSIS — H903 Sensorineural hearing loss, bilateral: Secondary | ICD-10-CM

## 2014-09-05 ENCOUNTER — Emergency Department (HOSPITAL_COMMUNITY): Payer: Medicare Other

## 2014-09-05 ENCOUNTER — Encounter (HOSPITAL_COMMUNITY): Payer: Self-pay

## 2014-09-05 ENCOUNTER — Inpatient Hospital Stay (HOSPITAL_COMMUNITY)
Admission: EM | Admit: 2014-09-05 | Discharge: 2014-09-11 | DRG: 291 | Disposition: A | Discharge status: Skilled Nursing Facility | Payer: Medicare Other | Attending: Pulmonary Disease | Admitting: Pulmonary Disease

## 2014-09-05 ENCOUNTER — Inpatient Hospital Stay (HOSPITAL_COMMUNITY): Payer: Medicare Other

## 2014-09-05 DIAGNOSIS — E119 Type 2 diabetes mellitus without complications: Secondary | ICD-10-CM | POA: Insufficient documentation

## 2014-09-05 DIAGNOSIS — J441 Chronic obstructive pulmonary disease with (acute) exacerbation: Secondary | ICD-10-CM | POA: Diagnosis present

## 2014-09-05 DIAGNOSIS — I129 Hypertensive chronic kidney disease with stage 1 through stage 4 chronic kidney disease, or unspecified chronic kidney disease: Secondary | ICD-10-CM | POA: Diagnosis present

## 2014-09-05 DIAGNOSIS — Z993 Dependence on wheelchair: Secondary | ICD-10-CM

## 2014-09-05 DIAGNOSIS — Z9981 Dependence on supplemental oxygen: Secondary | ICD-10-CM

## 2014-09-05 DIAGNOSIS — R0600 Dyspnea, unspecified: Secondary | ICD-10-CM

## 2014-09-05 DIAGNOSIS — N183 Chronic kidney disease, stage 3 (moderate): Secondary | ICD-10-CM | POA: Diagnosis present

## 2014-09-05 DIAGNOSIS — H9190 Unspecified hearing loss, unspecified ear: Secondary | ICD-10-CM | POA: Diagnosis present

## 2014-09-05 DIAGNOSIS — E1122 Type 2 diabetes mellitus with diabetic chronic kidney disease: Secondary | ICD-10-CM | POA: Diagnosis present

## 2014-09-05 DIAGNOSIS — F0390 Unspecified dementia without behavioral disturbance: Secondary | ICD-10-CM | POA: Diagnosis present

## 2014-09-05 DIAGNOSIS — J9621 Acute and chronic respiratory failure with hypoxia: Secondary | ICD-10-CM

## 2014-09-05 DIAGNOSIS — N189 Chronic kidney disease, unspecified: Secondary | ICD-10-CM | POA: Diagnosis present

## 2014-09-05 DIAGNOSIS — I5023 Acute on chronic systolic (congestive) heart failure: Secondary | ICD-10-CM | POA: Diagnosis present

## 2014-09-05 DIAGNOSIS — F410 Panic disorder [episodic paroxysmal anxiety] without agoraphobia: Secondary | ICD-10-CM | POA: Diagnosis present

## 2014-09-05 DIAGNOSIS — N390 Urinary tract infection, site not specified: Secondary | ICD-10-CM | POA: Diagnosis present

## 2014-09-05 DIAGNOSIS — Z91013 Allergy to seafood: Secondary | ICD-10-CM | POA: Diagnosis not present

## 2014-09-05 DIAGNOSIS — M1990 Unspecified osteoarthritis, unspecified site: Secondary | ICD-10-CM | POA: Diagnosis present

## 2014-09-05 DIAGNOSIS — Z87891 Personal history of nicotine dependence: Secondary | ICD-10-CM | POA: Diagnosis not present

## 2014-09-05 DIAGNOSIS — I509 Heart failure, unspecified: Secondary | ICD-10-CM

## 2014-09-05 DIAGNOSIS — N179 Acute kidney failure, unspecified: Secondary | ICD-10-CM | POA: Diagnosis present

## 2014-09-05 DIAGNOSIS — I5020 Unspecified systolic (congestive) heart failure: Secondary | ICD-10-CM

## 2014-09-05 DIAGNOSIS — R0602 Shortness of breath: Secondary | ICD-10-CM | POA: Diagnosis present

## 2014-09-05 DIAGNOSIS — I480 Paroxysmal atrial fibrillation: Secondary | ICD-10-CM | POA: Diagnosis present

## 2014-09-05 DIAGNOSIS — J9620 Acute and chronic respiratory failure, unspecified whether with hypoxia or hypercapnia: Secondary | ICD-10-CM | POA: Diagnosis present

## 2014-09-05 HISTORY — DX: Disorder of kidney and ureter, unspecified: N28.9

## 2014-09-05 LAB — GLUCOSE, CAPILLARY
GLUCOSE-CAPILLARY: 152 mg/dL — AB (ref 65–99)
GLUCOSE-CAPILLARY: 216 mg/dL — AB (ref 65–99)

## 2014-09-05 LAB — CBC WITH DIFFERENTIAL/PLATELET
BASOS ABS: 0 10*3/uL (ref 0.0–0.1)
BASOS PCT: 0 % (ref 0–1)
EOS ABS: 0.1 10*3/uL (ref 0.0–0.7)
Eosinophils Relative: 2 % (ref 0–5)
HCT: 35.3 % — ABNORMAL LOW (ref 36.0–46.0)
HEMOGLOBIN: 11.2 g/dL — AB (ref 12.0–15.0)
Lymphocytes Relative: 16 % (ref 12–46)
Lymphs Abs: 1.4 10*3/uL (ref 0.7–4.0)
MCH: 25.5 pg — ABNORMAL LOW (ref 26.0–34.0)
MCHC: 31.7 g/dL (ref 30.0–36.0)
MCV: 80.2 fL (ref 78.0–100.0)
Monocytes Absolute: 0.8 10*3/uL (ref 0.1–1.0)
Monocytes Relative: 9 % (ref 3–12)
NEUTROS ABS: 6.4 10*3/uL (ref 1.7–7.7)
NEUTROS PCT: 73 % (ref 43–77)
Platelets: 199 10*3/uL (ref 150–400)
RBC: 4.4 MIL/uL (ref 3.87–5.11)
RDW: 15.2 % (ref 11.5–15.5)
WBC: 8.7 10*3/uL (ref 4.0–10.5)

## 2014-09-05 LAB — BASIC METABOLIC PANEL
ANION GAP: 8 (ref 5–15)
BUN: 26 mg/dL — ABNORMAL HIGH (ref 6–20)
CHLORIDE: 102 mmol/L (ref 101–111)
CO2: 25 mmol/L (ref 22–32)
CREATININE: 1.84 mg/dL — AB (ref 0.44–1.00)
Calcium: 8.6 mg/dL — ABNORMAL LOW (ref 8.9–10.3)
GFR calc non Af Amer: 24 mL/min — ABNORMAL LOW (ref >60)
GFR, EST AFRICAN AMERICAN: 27 mL/min — AB (ref >60)
Glucose, Bld: 127 mg/dL — ABNORMAL HIGH (ref 65–99)
Potassium: 4.1 mmol/L (ref 3.5–5.1)
Sodium: 135 mmol/L (ref 135–145)

## 2014-09-05 LAB — URINALYSIS, ROUTINE W REFLEX MICROSCOPIC
Bilirubin Urine: NEGATIVE
GLUCOSE, UA: NEGATIVE mg/dL
Ketones, ur: NEGATIVE mg/dL
LEUKOCYTES UA: NEGATIVE
Nitrite: NEGATIVE
PROTEIN: 30 mg/dL — AB
Specific Gravity, Urine: 1.025 (ref 1.005–1.030)
Urobilinogen, UA: 0.2 mg/dL (ref 0.0–1.0)
pH: 6 (ref 5.0–8.0)

## 2014-09-05 LAB — URINE MICROSCOPIC-ADD ON

## 2014-09-05 LAB — BRAIN NATRIURETIC PEPTIDE: B NATRIURETIC PEPTIDE 5: 1481 pg/mL — AB (ref 0.0–100.0)

## 2014-09-05 LAB — MAGNESIUM: Magnesium: 2.4 mg/dL (ref 1.7–2.4)

## 2014-09-05 LAB — MRSA PCR SCREENING: MRSA by PCR: NEGATIVE

## 2014-09-05 LAB — CBG MONITORING, ED: Glucose-Capillary: 127 mg/dL — ABNORMAL HIGH (ref 65–99)

## 2014-09-05 LAB — TROPONIN I
Troponin I: <0.03 ng/mL (ref <0.031)
Troponin I: <0.03 ng/mL (ref <0.031)

## 2014-09-05 MED ORDER — SIMETHICONE 80 MG PO CHEW
80.0000 mg | CHEWABLE_TABLET | Freq: Three times a day (TID) | ORAL | Status: DC
  Administered 2014-09-05 – 2014-09-11 (×18): 80 mg via ORAL
  Filled 2014-09-05 (×18): qty 1

## 2014-09-05 MED ORDER — PANTOPRAZOLE SODIUM 40 MG PO TBEC
40.0000 mg | DELAYED_RELEASE_TABLET | Freq: Every day | ORAL | Status: DC
  Administered 2014-09-05 – 2014-09-11 (×7): 40 mg via ORAL
  Filled 2014-09-05 (×7): qty 1

## 2014-09-05 MED ORDER — ACETAMINOPHEN 325 MG PO TABS: 650.0000 mg | ORAL_TABLET | Freq: Four times a day (QID) | ORAL | Status: DC | PRN

## 2014-09-05 MED ORDER — ALBUTEROL SULFATE (2.5 MG/3ML) 0.083% IN NEBU
INHALATION_SOLUTION | RESPIRATORY_TRACT | Status: AC
  Filled 2014-09-05: qty 3

## 2014-09-05 MED ORDER — SODIUM CHLORIDE 0.9 % IV SOLN
250.0000 mL | INTRAVENOUS | Status: DC | PRN
  Administered 2014-09-05 – 2014-09-07 (×2): 250 mL via INTRAVENOUS

## 2014-09-05 MED ORDER — ENOXAPARIN SODIUM 30 MG/0.3ML ~~LOC~~ SOLN
30.0000 mg | SUBCUTANEOUS | Status: DC
  Administered 2014-09-05 – 2014-09-10 (×6): 30 mg via SUBCUTANEOUS
  Filled 2014-09-05 (×8): qty 0.3

## 2014-09-05 MED ORDER — SODIUM CHLORIDE 0.9 % IJ SOLN
3.0000 mL | Freq: Two times a day (BID) | INTRAMUSCULAR | Status: DC
  Administered 2014-09-05 – 2014-09-11 (×13): 3 mL via INTRAVENOUS

## 2014-09-05 MED ORDER — SULFAMETHOXAZOLE-TRIMETHOPRIM 800-160 MG PO TABS
1.0000 | ORAL_TABLET | Freq: Two times a day (BID) | ORAL | Status: DC
  Administered 2014-09-05 – 2014-09-07 (×5): 1 via ORAL
  Filled 2014-09-05 (×5): qty 1

## 2014-09-05 MED ORDER — INSULIN ASPART 100 UNIT/ML ~~LOC~~ SOLN
0.0000 [IU] | Freq: Three times a day (TID) | SUBCUTANEOUS | Status: DC
  Administered 2014-09-05: 2 [IU] via SUBCUTANEOUS
  Administered 2014-09-06: 1 [IU] via SUBCUTANEOUS
  Administered 2014-09-06: 2 [IU] via SUBCUTANEOUS
  Administered 2014-09-06 – 2014-09-07 (×3): 1 [IU] via SUBCUTANEOUS
  Administered 2014-09-07: 3 [IU] via SUBCUTANEOUS
  Administered 2014-09-08: 1 [IU] via SUBCUTANEOUS
  Administered 2014-09-08: 2 [IU] via SUBCUTANEOUS
  Administered 2014-09-08: 1 [IU] via SUBCUTANEOUS
  Administered 2014-09-09: 2 [IU] via SUBCUTANEOUS
  Administered 2014-09-09 (×2): 1 [IU] via SUBCUTANEOUS
  Administered 2014-09-10 (×2): 2 [IU] via SUBCUTANEOUS
  Administered 2014-09-10: 1 [IU] via SUBCUTANEOUS
  Administered 2014-09-11: 2 [IU] via SUBCUTANEOUS

## 2014-09-05 MED ORDER — ALUM & MAG HYDROXIDE-SIMETH 200-200-20 MG/5ML PO SUSP: 30.0000 mL | Freq: Four times a day (QID) | ORAL | Status: DC | PRN

## 2014-09-05 MED ORDER — SODIUM CHLORIDE 0.9 % IJ SOLN: 3.0000 mL | INTRAMUSCULAR | Status: DC | PRN

## 2014-09-05 MED ORDER — SENNOSIDES-DOCUSATE SODIUM 8.6-50 MG PO TABS: 1.0000 | ORAL_TABLET | Freq: Every evening | ORAL | Status: DC | PRN

## 2014-09-05 MED ORDER — FUROSEMIDE 10 MG/ML IJ SOLN
80.0000 mg | Freq: Once | INTRAMUSCULAR | Status: AC
  Administered 2014-09-05: 80 mg via INTRAVENOUS
  Filled 2014-09-05: qty 8

## 2014-09-05 MED ORDER — CARVEDILOL 12.5 MG PO TABS
12.5000 mg | ORAL_TABLET | Freq: Two times a day (BID) | ORAL | Status: DC
  Administered 2014-09-05 – 2014-09-11 (×13): 12.5 mg via ORAL
  Filled 2014-09-05 (×13): qty 1

## 2014-09-05 MED ORDER — MEMANTINE HCL ER 28 MG PO CP24
28.0000 mg | ORAL_CAPSULE | Freq: Every day | ORAL | Status: DC
  Administered 2014-09-05 – 2014-09-11 (×7): 28 mg via ORAL
  Filled 2014-09-05 (×9): qty 1

## 2014-09-05 MED ORDER — RIVASTIGMINE TARTRATE 3 MG PO CAPS
4.5000 mg | ORAL_CAPSULE | Freq: Every day | ORAL | Status: DC
  Administered 2014-09-05 – 2014-09-11 (×7): 4.5 mg via ORAL
  Filled 2014-09-05 (×9): qty 1

## 2014-09-05 MED ORDER — GUAIFENESIN-DM 100-10 MG/5ML PO SYRP: 5.0000 mL | ORAL_SOLUTION | ORAL | Status: DC | PRN

## 2014-09-05 MED ORDER — VENLAFAXINE HCL ER 37.5 MG PO CP24
37.5000 mg | ORAL_CAPSULE | Freq: Every day | ORAL | Status: DC
  Administered 2014-09-05 – 2014-09-11 (×7): 37.5 mg via ORAL
  Filled 2014-09-05 (×9): qty 1

## 2014-09-05 MED ORDER — MAGNESIUM OXIDE 400 (241.3 MG) MG PO TABS
400.0000 mg | ORAL_TABLET | Freq: Two times a day (BID) | ORAL | Status: DC
  Administered 2014-09-05 – 2014-09-11 (×13): 400 mg via ORAL
  Filled 2014-09-05 (×13): qty 1

## 2014-09-05 MED ORDER — POTASSIUM CHLORIDE CRYS ER 20 MEQ PO TBCR
20.0000 meq | EXTENDED_RELEASE_TABLET | Freq: Every day | ORAL | Status: DC
Start: 2014-09-05 — End: 2014-09-11
  Administered 2014-09-05 – 2014-09-11 (×7): 20 meq via ORAL
  Filled 2014-09-05 (×7): qty 1

## 2014-09-05 MED ORDER — TRAMADOL HCL 50 MG PO TABS: 50.0000 mg | ORAL_TABLET | Freq: Four times a day (QID) | ORAL | Status: DC | PRN

## 2014-09-05 MED ORDER — ACETAMINOPHEN 650 MG RE SUPP: 650.0000 mg | Freq: Four times a day (QID) | RECTAL | Status: DC | PRN

## 2014-09-05 MED ORDER — ALBUTEROL SULFATE (2.5 MG/3ML) 0.083% IN NEBU: 2.5000 mg | INHALATION_SOLUTION | RESPIRATORY_TRACT | Status: DC

## 2014-09-05 MED ORDER — ONDANSETRON HCL 4 MG PO TABS
4.0000 mg | ORAL_TABLET | Freq: Four times a day (QID) | ORAL | Status: DC | PRN
Start: 2014-09-05 — End: 2014-09-11

## 2014-09-05 MED ORDER — SORBITOL 70 % SOLN: 30.0000 mL | Freq: Every day | Status: DC | PRN

## 2014-09-05 MED ORDER — FUROSEMIDE 10 MG/ML IJ SOLN
60.0000 mg | Freq: Two times a day (BID) | INTRAMUSCULAR | Status: DC
  Administered 2014-09-05 – 2014-09-06 (×3): 60 mg via INTRAVENOUS
  Filled 2014-09-05 (×3): qty 6

## 2014-09-05 MED ORDER — TRAZODONE HCL 50 MG PO TABS
25.0000 mg | ORAL_TABLET | Freq: Every evening | ORAL | Status: DC | PRN
  Administered 2014-09-05 – 2014-09-07 (×2): 25 mg via ORAL
  Filled 2014-09-05 (×3): qty 1

## 2014-09-05 MED ORDER — LEVETIRACETAM 250 MG PO TABS
ORAL_TABLET | ORAL | Status: AC
  Filled 2014-09-05: qty 1

## 2014-09-05 MED ORDER — LEVETIRACETAM 250 MG PO TABS
250.0000 mg | ORAL_TABLET | Freq: Two times a day (BID) | ORAL | Status: DC
  Administered 2014-09-05 – 2014-09-11 (×13): 250 mg via ORAL
  Filled 2014-09-05 (×13): qty 1

## 2014-09-05 MED ORDER — MAGNESIUM CITRATE PO SOLN: 1.0000 | Freq: Once | ORAL | Status: DC | PRN

## 2014-09-05 MED ORDER — BENZONATATE 100 MG PO CAPS
200.0000 mg | ORAL_CAPSULE | Freq: Three times a day (TID) | ORAL | Status: DC | PRN
  Administered 2014-09-06: 200 mg via ORAL
  Filled 2014-09-05: qty 2

## 2014-09-05 MED ORDER — IPRATROPIUM BROMIDE 0.02 % IN SOLN: 0.5000 mg | RESPIRATORY_TRACT | Status: DC

## 2014-09-05 MED ORDER — INSULIN ASPART 100 UNIT/ML ~~LOC~~ SOLN
0.0000 [IU] | Freq: Every day | SUBCUTANEOUS | Status: DC
  Administered 2014-09-05 – 2014-09-07 (×2): 2 [IU] via SUBCUTANEOUS

## 2014-09-05 MED ORDER — LORATADINE 10 MG PO TABS
10.0000 mg | ORAL_TABLET | Freq: Every day | ORAL | Status: DC
  Administered 2014-09-05 – 2014-09-11 (×7): 10 mg via ORAL
  Filled 2014-09-05 (×7): qty 1

## 2014-09-05 MED ORDER — METHYLPREDNISOLONE SODIUM SUCC 125 MG IJ SOLR
60.0000 mg | Freq: Four times a day (QID) | INTRAMUSCULAR | Status: DC
  Administered 2014-09-05 – 2014-09-07 (×7): 60 mg via INTRAVENOUS
  Filled 2014-09-05 (×7): qty 2

## 2014-09-05 MED ORDER — PANTOPRAZOLE SODIUM 40 MG PO TBEC: 40.0000 mg | DELAYED_RELEASE_TABLET | Freq: Every day | ORAL | Status: DC

## 2014-09-05 MED ORDER — ONDANSETRON HCL 4 MG/2ML IJ SOLN: 4.0000 mg | Freq: Four times a day (QID) | INTRAMUSCULAR | Status: DC | PRN

## 2014-09-05 MED ORDER — IPRATROPIUM-ALBUTEROL 0.5-2.5 (3) MG/3ML IN SOLN
3.0000 mL | Freq: Four times a day (QID) | RESPIRATORY_TRACT | Status: DC
  Administered 2014-09-05 – 2014-09-08 (×10): 3 mL via RESPIRATORY_TRACT
  Filled 2014-09-05 (×9): qty 3

## 2014-09-05 MED ORDER — HYDROCODONE-ACETAMINOPHEN 5-325 MG PO TABS: 1.0000 | ORAL_TABLET | ORAL | Status: DC | PRN

## 2014-09-05 MED ORDER — AMLODIPINE BESYLATE 5 MG PO TABS
10.0000 mg | ORAL_TABLET | Freq: Every day | ORAL | Status: DC
  Administered 2014-09-05 – 2014-09-11 (×7): 10 mg via ORAL
  Filled 2014-09-05 (×7): qty 2

## 2014-09-05 NOTE — ED Notes (Signed)
Pt placed on bedpan with urine output, two person assist.

## 2014-09-05 NOTE — ED Notes (Signed)
Pt on 6L 02 with sat 89-90 percent, dr. zammit notified and told me to keep her on 6L.

## 2014-09-05 NOTE — H&P (Signed)
[facility] History and Physical  Melissa [name] BJY:[id number] DOB: [birth year] DOA: 09/05/2014  Referring physician: zammitt PCP: Fredirick Maudlin, MD   Chief Complaint: worsening sob  HPI: Melissa [name] is a very pleasant 81 y.o. female with a history of COPD does not wear continuous oxygen at home, congestive heart failure, hypertension, diabetes presented to the emergency department with the chief complaint of gradual onset worsening shortness of breath. Initial evaluation reveals acute respiratory failure with hypoxia.  Patient reports that over the last 5-6 days she has experienced a gradual worsening shortness of breath. She indicates that at baseline she does not need continuous oxygen and rarely needs her when necessary oxygen. She is wheelchair-bound and has been so for the last year due to her respiratory status. She reports over the last 3 days she has worn her oxygen continuously but has continued to worsen. associated symptoms include worsening orthopnea, dry cough and wheezing.. She denies chest pain palpitation headache abdominal pain nausea vomiting diarrhea. She does report she is currently on an antibiotic for urinary tract infection and has 3 days left. EMS was called to her assisted living she was provided with albuterol treatment DuoNeb's and 125 mg of Solu-Medrol. She reports to me that her breathing is somewhat better but nowhere near her baseline. He'll emergency department her oxygen saturation level dropped 89% while she was on 6 L nasal cannula.  Workup in the emergency department includes a chest x-ray revealing persisting consolidation left base may represent atelectasis or infiltrate, cardiomegaly, central pulmonary vascular prominence. Basic metabolic panel significant for creatinine of 1.8 for calcium of 8.6 glucose 127, initial troponin 0.03. BNP 1481. EKG reveals Sinus rhythm Abnormal R-wave progression, early transition Abnormal inferior Q waves Borderline  prolonged QT interval.  In the emergency department she is afebrile hemodynamically stable with oxygen saturation level of 90% on 6 L nasal cannula. She is provided with 80 mg of Lasix.  Review of Systems:  10 point review of systems complete and all systems are negative except as indicated in the history of present illness   Past Medical History  Diagnosis Date  . COPD (chronic obstructive pulmonary disease)   . CHF (congestive heart failure)   . Delirium   . Metabolic alkalosis   . Hypertension   . Diabetes mellitus   . Anxiety   . Depression   . Panic attacks   . Chronic diarrhea of unknown origin   . Gout   . Arthritis   . Dementia   . Renal disorder    Past Surgical History  Procedure Laterality Date  . Inguinal hernia repair      right   Social History:  reports that she has quit smoking. She does not have any smokeless tobacco history on file. She reports that she does not drink alcohol or use illicit drugs. He lives at assisted living facility. She is wheelchair-bound and has been for the last year reportedly related to worsening dyspnea on exertion. She has mild dimension moderate assist Allergies  Allergen Reactions  . Shellfish Allergy Diarrhea    Severe diarrhea and required hospitalization    No family history on file. family medical history reviewed and noncontributory to the admission of this elderly lady  Prior to Admission medications   Medication Sig Start Date End Date Taking? Authorizing Provider  amLODipine (NORVASC) 10 MG tablet Take 10 mg by mouth daily.     Yes Historical Provider, MD  carvedilol (COREG) 12.5 MG tablet Take 12.5 mg  by mouth 2 (two) times daily.   Yes Historical Provider, MD  escitalopram (LEXAPRO) 10 MG tablet Take 10 mg by mouth daily.   Yes Historical Provider, MD  guaiFENesin (MUCINEX) 600 MG 12 hr tablet Take 600 mg by mouth 2 (two) times daily.   Yes Historical Provider, MD  levETIRAcetam (KEPPRA) 250 MG tablet Take 250 mg by  mouth 2 (two) times daily.     Yes Historical Provider, MD  loratadine (CLARITIN) 10 MG tablet Take 10 mg by mouth daily.   Yes Historical Provider, MD  magnesium oxide (MAG-OX) 400 MG tablet Take 400 mg by mouth 2 (two) times daily.   Yes Historical Provider, MD  Memantine HCl ER (NAMENDA XR) 28 MG CP24 Take 28 mg by mouth daily.   Yes Historical Provider, MD  omeprazole (PRILOSEC) 20 MG capsule Take 20 mg by mouth daily.   Yes Historical Provider, MD  potassium chloride SA (K-DUR,KLOR-CON) 20 MEQ tablet Take 20 mEq by mouth daily.   Yes Historical Provider, MD  rivastigmine (EXELON) 4.5 MG capsule Take 4.5 mg by mouth daily.   Yes Historical Provider, MD  sulfamethoxazole-trimethoprim (BACTRIM DS,SEPTRA DS) 800-160 MG per tablet Take 1 tablet by mouth 2 (two) times daily. For 10 days(started 08/29/14)   Yes Historical Provider, MD  torsemide (DEMADEX) 100 MG tablet Take 50 mg by mouth daily.   Yes Historical Provider, MD  alum & mag hydroxide-simeth (MAALOX/MYLANTA) 200-200-20 MG/5ML suspension Take 10 mLs by mouth every 4 (four) hours as needed. For indigestion    Historical Provider, MD  benzonatate (TESSALON) 200 MG capsule Take 200 mg by mouth 3 (three) times daily as needed for cough.     Historical Provider, MD  pantoprazole (PROTONIX) 40 MG tablet Take 40 mg by mouth daily.    Historical Provider, MD  potassium chloride (K-DUR,KLOR-CON) 20 MEQ tablet Take 1 tablet (20 mEq total) by mouth daily. Patient not taking: Reported on 09/05/2014 06/25/13   Kari Baars, MD  simethicone (MYLICON) 80 MG chewable tablet Chew 80 mg by mouth 3 (three) times daily.    Historical Provider, MD  torsemide (DEMADEX) 20 MG tablet Take 1 tablet (20 mg total) by mouth daily. Patient not taking: Reported on 01/01/2014 06/25/13   Kari Baars, MD  traMADol (ULTRAM) 50 MG tablet Take 50 mg by mouth 4 (four) times daily as needed for moderate pain.    Historical Provider, MD  venlafaxine (EFFEXOR-XR) 37.5 MG 24 hr  capsule Take 37.5 mg by mouth daily.      Historical Provider, MD   Physical Exam: Filed Vitals:   09/05/14 1213 09/05/14 1230 09/05/14 1330 09/05/14 1346  BP:  140/78 131/71   Pulse: 92 89  86  Temp:      TempSrc:      Resp: 21 23  23   Height:      Weight:      SpO2: 94% 97%  90%    Wt Readings from Last 3 Encounters:  09/05/14 63.957 kg (141 lb)  01/01/14 63.504 kg (140 lb)  07/16/13 63.504 kg (140 lb)    General:  Appears slightly anxious and somewhat uncomfortable Eyes: PERRL, normal lids, irises & conjunctiva ENT: grossly normal hearing, mucous membranes of her mouth are pink but slightly dry Neck: no LAD, masses or thyromegaly Cardiovascular: RRR, no m/r/g. No LE edema. Pulses present and palpable  Respiratory: Moderate increased work of breathing with conversation. Has difficulty completing a sentence. Using abdominal accessory muscles.  Breath sounds are  quite diminished throughout particularly in the bases. Faint end expiratory wheeze. No crackles. Abdomen: soft, ntnd positive bowel sounds throughout Skin: no rash or induration seen on limited exam Musculoskeletal: grossly normal tone BUE/BLE Psychiatric: grossly normal mood and affect, speech fluent and appropriate Neurologic: grossly non-focal. Speech clear facial symmetry patient is very hard of hearing           Labs on Admission:  Basic Metabolic Panel:  Recent Labs Lab 09/05/14 1002  NA 135  K 4.1  CL 102  CO2 25  GLUCOSE 127*  BUN 26*  CREATININE 1.84*  CALCIUM 8.6*   Liver Function Tests: No results for input(s): AST, ALT, ALKPHOS, BILITOT, PROT, ALBUMIN in the last 168 hours. No results for input(s): LIPASE, AMYLASE in the last 168 hours. No results for input(s): AMMONIA in the last 168 hours. CBC:  Recent Labs Lab 09/05/14 1002  WBC 8.7  NEUTROABS 6.4  HGB 11.2*  HCT 35.3*  MCV 80.2  PLT 199   Cardiac Enzymes:  Recent Labs Lab 09/05/14 1002  TROPONINI <0.03    BNP (last 3  results)  Recent Labs  09/05/14 1002  BNP 1481.0*    ProBNP (last 3 results) No results for input(s): PROBNP in the last 8760 hours.  CBG:  Recent Labs Lab 09/05/14 1244  GLUCAP 127*    Radiological Exams on Admission: Dg Chest Portable 1 View  09/05/2014   CLINICAL DATA:  81 year old diabetic female with 1 and half week history of shortness of breath. Wheezing. Hypertension. COPD. History of CHF. Prior smoker. Initial encounter.  EXAM: PORTABLE CHEST - 1 VIEW  COMPARISON:  01/01/2014 chest x-ray and chest CT.  FINDINGS: Persisting consolidation left base base may represent atelectasis or infiltrate but needs to be followed with two-view chest until clearance.  Cardiomegaly.  Central pulmonary vascular prominence.  Calcified aorta.  IMPRESSION: Persisting consolidation left base base may represent atelectasis or infiltrate but needs to be followed with two-view chest until this has cleared.  Cardiomegaly.  Central pulmonary vascular prominence.   Electronically Signed   By: Lacy Duverney M.D.   On: 09/05/2014 10:02    EKG: As noted above  Assessment/Plan Principal Problem:   Acute on chronic respiratory failure: Likely multifactorial specifically COPD exacerbation in the setting of mild acute on chronic systolic heart failure. At baseline patient is not on continuous oxygen and she currently requires 6 L nasal cannula to keep her oxygen saturation level greater than 90%. Will continue IV Lasix as blood pressure permits will monitor intake and output will obtain daily weights. Her last echo done in 2013 revealed an EF of 40%. Get an updated echocardiogram. Will continue Solu-Medrol scheduled nebulizers as well. Anterior oxygen supplementation and monitor closely in the stepdown unit. Active Problems:  Acute on chronic systolic congestive heart failure: Most recent echo in 2013 as noted above. I will repeat this. Home medications include Demadex, Coreg. ProBNP elevated. Continue Lasix as  noted above. If no improvement consider cardiology consult    COPD exacerbation: Patient reports she does not wear continuous oxygen at home. See #1.    Chronic renal failure: Stage III. Chart review indicates current creatinine close to baseline. Will monitor closely given for IV Lasix.    Dementia: Appears to be stable at baseline. Will continue home medications   HOH (hard of hearing)  Diabetes: type II. Diet controlled. Will obtain A1c. Use SSI for optimal control.   UTI: diagnosed PTA. Will continue antibiotic as she has 3  days left. She is afebrile and non-toxic appearing.     Code Status: full DVT Prophylaxis: Family Communication: none present Disposition Plan: back to assisted living at discharge.   Time spent: 60 minutes  [facility] Pager [phone]

## 2014-09-05 NOTE — Progress Notes (Signed)
Received report from [facility] in ER about Melissa [name].

## 2014-09-05 NOTE — ED Notes (Signed)
Notified facility that pt was being admitted.

## 2014-09-05 NOTE — ED Notes (Signed)
Assisted pt onto bedpan, changed linens.  Pt states that she is out of breath.  98%on aerosol mask, dr. zammit notified and he ordered for pt to be put on 2L [place].  Pt currently 94% on 2L.

## 2014-09-05 NOTE — ED Notes (Addendum)
EMS reports pt has had increased difficulty breathing for the past 1 1/2 weeks.  Is on an antibiotic but EMS not sure why.  Reports pt was wheezing on assessment, and they administered 1 albuterol treatement, duoneb, and 125 mg IV solumedrol.  CBG 159

## 2014-09-05 NOTE — ED Provider Notes (Signed)
CSN: [id number]     Arrival date & time 09/05/14  0939 History  This chart was scribed for Melissa Berkshire, MD by Phillis Haggis, ED Scribe. This patient was seen in room APA14/APA14 and patient care was started at 9:52 AM.     Chief Complaint  Patient presents with  . Respiratory Distress   Patient is a 81 y.o. female presenting with shortness of breath. The history is provided by the patient and the EMS personnel. No language interpreter was used.  Shortness of Breath Severity:  Moderate Onset quality:  Sudden Duration:  2 weeks Timing:  Constant Progression:  Worsening Chronicity:  New Ineffective treatments:  Oxygen Associated symptoms: cough   Associated symptoms: no abdominal pain, no chest pain and no rash    HPI Comments: Melissa [name] is a 81 y.o. Female with hx of COPD, CHF, HTN, DM, and dementia who presents to the Emergency Department complaining of SOB onset 1.5 weeks ago. Pt was brought in by EMS from [facility]. She states that it is hard to speak and uses 2L oxygen at home. Per triage note, EMS reports that pt has wheezing on assessment; states pt was given 1 albuterol treatement, duoneb, and 125 mg IV solumedrol. She reports associated slight cough and fatigue. She denies pain anywhere.   Past Medical History  Diagnosis Date  . COPD (chronic obstructive pulmonary disease)   . CHF (congestive heart failure)   . Delirium   . Metabolic alkalosis   . Hypertension   . Diabetes mellitus   . Anxiety   . Depression   . Panic attacks   . Chronic diarrhea of unknown origin   . Gout   . Arthritis   . Dementia   . Renal disorder    Past Surgical History  Procedure Laterality Date  . Inguinal hernia repair      right   No family history on file. Social History  Substance Use Topics  . Smoking status: Former Games developer  . Smokeless tobacco: None  . Alcohol Use: No   OB History    No data available     Review of Systems  Constitutional: Positive for fatigue.  Negative for appetite change.  HENT: Negative for ear discharge and sinus pressure.   Eyes: Negative for discharge.  Respiratory: Positive for cough and shortness of breath.   Cardiovascular: Negative for chest pain.  Gastrointestinal: Negative for abdominal pain and diarrhea.  Genitourinary: Negative for frequency and hematuria.  Musculoskeletal: Negative for back pain.  Skin: Negative for rash.  Neurological: Negative for seizures.  Psychiatric/Behavioral: Negative for hallucinations.   Allergies  Shellfish allergy  Home Medications   Prior to Admission medications   Medication Sig Start Date End Date Taking? Authorizing Provider  alum & mag hydroxide-simeth (MAALOX/MYLANTA) 200-200-20 MG/5ML suspension Take 10 mLs by mouth every 4 (four) hours as needed. For indigestion    Historical Provider, MD  amLODipine (NORVASC) 10 MG tablet Take 10 mg by mouth daily.      Historical Provider, MD  benzonatate (TESSALON) 200 MG capsule Take 200 mg by mouth 3 (three) times daily as needed for cough.     Historical Provider, MD  carvedilol (COREG) 12.5 MG tablet Take 12.5 mg by mouth 2 (two) times daily.    Historical Provider, MD  escitalopram (LEXAPRO) 20 MG tablet Take 20 mg by mouth daily.    Historical Provider, MD  levETIRAcetam (KEPPRA) 250 MG tablet Take 250 mg by mouth 2 (two) times daily.  Historical Provider, MD  magnesium oxide (MAG-OX) 400 MG tablet Take 400 mg by mouth 2 (two) times daily.    Historical Provider, MD  Memantine HCl ER (NAMENDA XR) 28 MG CP24 Take 28 mg by mouth daily.    Historical Provider, MD  pantoprazole (PROTONIX) 40 MG tablet Take 40 mg by mouth daily.    Historical Provider, MD  potassium chloride (K-DUR,KLOR-CON) 20 MEQ tablet Take 1 tablet (20 mEq total) by mouth daily. 06/25/13   Kari Baars, MD  rivastigmine (EXELON) 4.5 MG capsule Take 4.5 mg by mouth daily.    Historical Provider, MD  simethicone (MYLICON) 80 MG chewable tablet Chew 80 mg by mouth 3  (three) times daily.    Historical Provider, MD  torsemide (DEMADEX) 100 MG tablet Take 50 mg by mouth daily.    Historical Provider, MD  torsemide (DEMADEX) 20 MG tablet Take 1 tablet (20 mg total) by mouth daily. Patient not taking: Reported on 01/01/2014 06/25/13   Kari Baars, MD  traMADol (ULTRAM) 50 MG tablet Take 50 mg by mouth 4 (four) times daily as needed for moderate pain.    Historical Provider, MD  venlafaxine (EFFEXOR-XR) 37.5 MG 24 hr capsule Take 37.5 mg by mouth daily.      Historical Provider, MD   Pulse 85  Temp(Src) 98.6 F (37 C) (Oral)  Resp 28  Ht 5\' 3"  (1.6 m)  Wt 141 lb (63.957 kg)  BMI 24.98 kg/m2  SpO2 94%  Physical Exam  Constitutional: She is oriented to person, place, and time. She appears well-developed.  HENT:  Head: Normocephalic.  Eyes: Conjunctivae and EOM are normal. No scleral icterus.  Neck: Neck supple. No thyromegaly present.  Cardiovascular: Normal rate and regular rhythm.  Exam reveals no gallop and no friction rub.   No murmur heard. Pulmonary/Chest: No stridor. She has wheezes. She has no rales. She exhibits no tenderness.  Moderate wheezing bilaterally  Abdominal: She exhibits no distension. There is no tenderness. There is no rebound.  Musculoskeletal: Normal range of motion. She exhibits no edema.  Lymphadenopathy:    She has no cervical adenopathy.  Neurological: She is oriented to person, place, and time. She exhibits normal muscle tone. Coordination normal.  Skin: No rash noted. No erythema.  Psychiatric: She has a normal mood and affect. Her behavior is normal.    ED Course  Procedures (including critical care time) DIAGNOSTIC STUDIES: Oxygen Saturation is 94% on RA, adequate by my interpretation.    COORDINATION OF CARE: 9:55 AM-Discussed treatment plan which includes labs with pt at bedside and pt agreed to plan.   Labs Review Labs Reviewed  CBC WITH DIFFERENTIAL/PLATELET - Abnormal; Notable for the following:     Hemoglobin 11.2 (*)    HCT 35.3 (*)    MCH 25.5 (*)    All other components within normal limits  BASIC METABOLIC PANEL - Abnormal; Notable for the following:    Glucose, Bld 127 (*)    BUN 26 (*)    Creatinine, Ser 1.84 (*)    Calcium 8.6 (*)    GFR calc non Af Amer 24 (*)    GFR calc Af Amer 27 (*)    All other components within normal limits  BRAIN NATRIURETIC PEPTIDE - Abnormal; Notable for the following:    B Natriuretic Peptide 1481.0 (*)    All other components within normal limits  TROPONIN I    Imaging Review Dg Chest Portable 1 View  09/05/2014   CLINICAL DATA:  81 year old diabetic female with 1 and half week history of shortness of breath. Wheezing. Hypertension. COPD. History of CHF. Prior smoker. Initial encounter.  EXAM: PORTABLE CHEST - 1 VIEW  COMPARISON:  01/01/2014 chest x-ray and chest CT.  FINDINGS: Persisting consolidation left base base may represent atelectasis or infiltrate but needs to be followed with two-view chest until clearance.  Cardiomegaly.  Central pulmonary vascular prominence.  Calcified aorta.  IMPRESSION: Persisting consolidation left base base may represent atelectasis or infiltrate but needs to be followed with two-view chest until this has cleared.  Cardiomegaly.  Central pulmonary vascular prominence.   Electronically Signed   By: Lacy Duverney M.D.   On: 09/05/2014 10:02      EKG Interpretation   Date/Time:  Tuesday September 05 2014 09:42:26 EDT Ventricular Rate:  85 PR Interval:  148 QRS Duration: 99 QT Interval:  412 QTC Calculation: 490 R Axis:   14 Text Interpretation:  Sinus rhythm Abnormal R-wave progression, early  transition Abnormal inferior Q waves Borderline prolonged QT interval  Confirmed by Madasyn Heath  MD, Quintina Hakeem [phone]) on 09/05/2014 1:43:06 PM      MDM   Final diagnoses:  None    Chf,   admit   Melissa Berkshire, MD 09/05/14 1413

## 2014-09-06 LAB — COMPREHENSIVE METABOLIC PANEL
ALT: 22 U/L (ref 14–54)
ANION GAP: 9 (ref 5–15)
AST: 20 U/L (ref 15–41)
Albumin: 3.3 g/dL — ABNORMAL LOW (ref 3.5–5.0)
Alkaline Phosphatase: 92 U/L (ref 38–126)
BUN: 29 mg/dL — ABNORMAL HIGH (ref 6–20)
CHLORIDE: 103 mmol/L (ref 101–111)
CO2: 26 mmol/L (ref 22–32)
Calcium: 8.6 mg/dL — ABNORMAL LOW (ref 8.9–10.3)
Creatinine, Ser: 1.78 mg/dL — ABNORMAL HIGH (ref 0.44–1.00)
GFR calc non Af Amer: 24 mL/min — ABNORMAL LOW (ref >60)
GFR, EST AFRICAN AMERICAN: 28 mL/min — AB (ref >60)
Glucose, Bld: 140 mg/dL — ABNORMAL HIGH (ref 65–99)
POTASSIUM: 3.8 mmol/L (ref 3.5–5.1)
SODIUM: 138 mmol/L (ref 135–145)
Total Bilirubin: 0.5 mg/dL (ref 0.3–1.2)
Total Protein: 6.5 g/dL (ref 6.5–8.1)

## 2014-09-06 LAB — GLUCOSE, CAPILLARY
GLUCOSE-CAPILLARY: 135 mg/dL — AB (ref 65–99)
Glucose-Capillary: 137 mg/dL — ABNORMAL HIGH (ref 65–99)
Glucose-Capillary: 143 mg/dL — ABNORMAL HIGH (ref 65–99)
Glucose-Capillary: 156 mg/dL — ABNORMAL HIGH (ref 65–99)

## 2014-09-06 LAB — CBC
HEMATOCRIT: 33.6 % — AB (ref 36.0–46.0)
HEMOGLOBIN: 10.7 g/dL — AB (ref 12.0–15.0)
MCH: 25.1 pg — AB (ref 26.0–34.0)
MCHC: 31.8 g/dL (ref 30.0–36.0)
MCV: 78.9 fL (ref 78.0–100.0)
Platelets: 198 10*3/uL (ref 150–400)
RBC: 4.26 MIL/uL (ref 3.87–5.11)
RDW: 15 % (ref 11.5–15.5)
WBC: 5.6 10*3/uL (ref 4.0–10.5)

## 2014-09-06 LAB — TROPONIN I: TROPONIN I: 0.03 ng/mL (ref <0.031)

## 2014-09-06 LAB — HEMOGLOBIN A1C
HEMOGLOBIN A1C: 6 % — AB (ref 4.8–5.6)
Mean Plasma Glucose: 126 mg/dL

## 2014-09-06 NOTE — Progress Notes (Signed)
Subjective: She was admitted with combined heart failure and COPD. She says she feels a little better. She has no new complaints. She is still short of breath with talking.  Objective: Vital signs in last 24 hours: Temp:  [97.7 F (36.5 C)-98.6 F (37 C)] 98.6 F (37 C) (08/31 0730) Pulse Rate:  [67-96] 84 (08/31 0750) Resp:  [13-28] 17 (08/31 0600) BP: (105-154)/(43-92) 110/45 mmHg (08/31 0748) SpO2:  [85 %-100 %] 94 % (08/31 0809) FiO2 (%):  [44 %] 44 % (08/30 1535) Weight:  [63.7 kg (140 lb 6.9 oz)-64.5 kg (142 lb 3.2 oz)] 64.5 kg (142 lb 3.2 oz) (08/31 0500) Weight change:  Last BM Date: 09/06/14  Intake/Output from previous day: 08/30 0701 - 08/31 0700 In: 453.8 [P.O.:360; I.V.:93.8] Out: 925 [Urine:925]  PHYSICAL EXAM General appearance: alert, cooperative and mild distress Resp: rales bilaterally Cardio: irregularly irregular rhythm GI: soft, non-tender; bowel sounds normal; no masses,  no organomegaly Extremities: extremities normal, atraumatic, no cyanosis or edema  Lab Results:  Results for orders placed or performed during the hospital encounter of 09/05/14 (from the past 48 hour(s))  CBC with Differential     Status: Abnormal   Collection Time: 09/05/14 10:02 AM  Result Value Ref Range   WBC 8.7 4.0 - 10.5 K/uL   RBC 4.40 3.87 - 5.11 MIL/uL   Hemoglobin 11.2 (L) 12.0 - 15.0 g/dL   HCT 35.3 (L) 36.0 - 46.0 %   MCV 80.2 78.0 - 100.0 fL   MCH 25.5 (L) 26.0 - 34.0 pg   MCHC 31.7 30.0 - 36.0 g/dL   RDW 15.2 11.5 - 15.5 %   Platelets 199 150 - 400 K/uL   Neutrophils Relative % 73 43 - 77 %   Neutro Abs 6.4 1.7 - 7.7 K/uL   Lymphocytes Relative 16 12 - 46 %   Lymphs Abs 1.4 0.7 - 4.0 K/uL   Monocytes Relative 9 3 - 12 %   Monocytes Absolute 0.8 0.1 - 1.0 K/uL   Eosinophils Relative 2 0 - 5 %   Eosinophils Absolute 0.1 0.0 - 0.7 K/uL   Basophils Relative 0 0 - 1 %   Basophils Absolute 0.0 0.0 - 0.1 K/uL  Basic metabolic panel     Status: Abnormal   Collection Time: 09/05/14 10:02 AM  Result Value Ref Range   Sodium 135 135 - 145 mmol/L   Potassium 4.1 3.5 - 5.1 mmol/L   Chloride 102 101 - 111 mmol/L   CO2 25 22 - 32 mmol/L   Glucose, Bld 127 (H) 65 - 99 mg/dL   BUN 26 (H) 6 - 20 mg/dL   Creatinine, Ser 1.84 (H) 0.44 - 1.00 mg/dL   Calcium 8.6 (L) 8.9 - 10.3 mg/dL   GFR calc non Af Amer 24 (L) >60 mL/min   GFR calc Af Amer 27 (L) >60 mL/min    Comment: (NOTE) The eGFR has been calculated using the CKD EPI equation. This calculation has not been validated in all clinical situations. eGFR's persistently <60 mL/min signify possible Chronic Kidney Disease.    Anion gap 8 5 - 15  Troponin I     Status: None   Collection Time: 09/05/14 10:02 AM  Result Value Ref Range   Troponin I <0.03 <0.031 ng/mL    Comment:        NO INDICATION OF MYOCARDIAL INJURY.   Brain natriuretic peptide     Status: Abnormal   Collection Time: 09/05/14 10:02 AM  Result  Value Ref Range   B Natriuretic Peptide 1481.0 (H) 0.0 - 100.0 pg/mL  Magnesium     Status: None   Collection Time: 09/05/14 10:02 AM  Result Value Ref Range   Magnesium 2.4 1.7 - 2.4 mg/dL  Hemoglobin A1c     Status: Abnormal   Collection Time: 09/05/14 10:02 AM  Result Value Ref Range   Hgb A1c MFr Bld 6.0 (H) 4.8 - 5.6 %    Comment: (NOTE)         Pre-diabetes: 5.7 - 6.4         Diabetes: >6.4         Glycemic control for adults with diabetes: <7.0    Mean Plasma Glucose 126 mg/dL    Comment: (NOTE) Performed At: [facility] [place], [place] [id number] Lindon Romp MD EL:[id number]   CBG monitoring, ED     Status: Abnormal   Collection Time: 09/05/14 12:44 PM  Result Value Ref Range   Glucose-Capillary 127 (H) 65 - 99 mg/dL  MRSA PCR Screening     Status: None   Collection Time: 09/05/14  3:27 PM  Result Value Ref Range   MRSA by PCR NEGATIVE NEGATIVE    Comment:        The GeneXpert MRSA Assay (FDA approved for NASAL specimens only),  is one component of a comprehensive MRSA colonization surveillance program. It is not intended to diagnose MRSA infection nor to guide or monitor treatment for MRSA infections.   Glucose, capillary     Status: Abnormal   Collection Time: 09/05/14  4:38 PM  Result Value Ref Range   Glucose-Capillary 152 (H) 65 - 99 mg/dL   Comment 1 Notify RN    Comment 2 Document in Chart   Urinalysis, Routine w reflex microscopic (not at [facility])     Status: Abnormal   Collection Time: 09/05/14  5:50 PM  Result Value Ref Range   Color, Urine YELLOW YELLOW   APPearance CLEAR CLEAR   Specific Gravity, Urine 1.025 1.005 - 1.030   pH 6.0 5.0 - 8.0   Glucose, UA NEGATIVE NEGATIVE mg/dL   Hgb urine dipstick MODERATE (A) NEGATIVE   Bilirubin Urine NEGATIVE NEGATIVE   Ketones, ur NEGATIVE NEGATIVE mg/dL   Protein, ur 30 (A) NEGATIVE mg/dL   Urobilinogen, UA 0.2 0.0 - 1.0 mg/dL   Nitrite NEGATIVE NEGATIVE   Leukocytes, UA NEGATIVE NEGATIVE  Urine microscopic-add on     Status: Abnormal   Collection Time: 09/05/14  5:50 PM  Result Value Ref Range   Squamous Epithelial / LPF RARE RARE   WBC, UA 7-10 <3 WBC/hpf   RBC / HPF 3-6 <3 RBC/hpf   Bacteria, UA FEW (A) RARE  Troponin I     Status: None   Collection Time: 09/05/14  6:10 PM  Result Value Ref Range   Troponin I <0.03 <0.031 ng/mL    Comment:        NO INDICATION OF MYOCARDIAL INJURY.   Glucose, capillary     Status: Abnormal   Collection Time: 09/05/14  9:10 PM  Result Value Ref Range   Glucose-Capillary 216 (H) 65 - 99 mg/dL   Comment 1 Notify RN   Comprehensive metabolic panel     Status: Abnormal   Collection Time: 09/06/14  4:20 AM  Result Value Ref Range   Sodium 138 135 - 145 mmol/L   Potassium 3.8 3.5 - 5.1 mmol/L   Chloride 103 101 - 111 mmol/L  CO2 26 22 - 32 mmol/L   Glucose, Bld 140 (H) 65 - 99 mg/dL   BUN 29 (H) 6 - 20 mg/dL   Creatinine, Ser 1.78 (H) 0.44 - 1.00 mg/dL   Calcium 8.6 (L) 8.9 - 10.3 mg/dL   Total Protein  6.5 6.5 - 8.1 g/dL   Albumin 3.3 (L) 3.5 - 5.0 g/dL   AST 20 15 - 41 U/L   ALT 22 14 - 54 U/L   Alkaline Phosphatase 92 38 - 126 U/L   Total Bilirubin 0.5 0.3 - 1.2 mg/dL   GFR calc non Af Amer 24 (L) >60 mL/min   GFR calc Af Amer 28 (L) >60 mL/min    Comment: (NOTE) The eGFR has been calculated using the CKD EPI equation. This calculation has not been validated in all clinical situations. eGFR's persistently <60 mL/min signify possible Chronic Kidney Disease.    Anion gap 9 5 - 15  CBC     Status: Abnormal   Collection Time: 09/06/14  4:20 AM  Result Value Ref Range   WBC 5.6 4.0 - 10.5 K/uL   RBC 4.26 3.87 - 5.11 MIL/uL   Hemoglobin 10.7 (L) 12.0 - 15.0 g/dL   HCT 33.6 (L) 36.0 - 46.0 %   MCV 78.9 78.0 - 100.0 fL   MCH 25.1 (L) 26.0 - 34.0 pg   MCHC 31.8 30.0 - 36.0 g/dL   RDW 15.0 11.5 - 15.5 %   Platelets 198 150 - 400 K/uL  Troponin I     Status: None   Collection Time: 09/06/14  4:20 AM  Result Value Ref Range   Troponin I 0.03 <0.031 ng/mL    Comment:        NO INDICATION OF MYOCARDIAL INJURY.   Glucose, capillary     Status: Abnormal   Collection Time: 09/06/14  7:29 AM  Result Value Ref Range   Glucose-Capillary 137 (H) 65 - 99 mg/dL   Comment 1 Notify RN    Comment 2 Document in Chart     ABGS No results for input(s): PHART, PO2ART, TCO2, HCO3 in the last 72 hours.  Invalid input(s): PCO2 CULTURES Recent Results (from the past 240 hour(s))  MRSA PCR Screening     Status: None   Collection Time: 09/05/14  3:27 PM  Result Value Ref Range Status   MRSA by PCR NEGATIVE NEGATIVE Final    Comment:        The GeneXpert MRSA Assay (FDA approved for NASAL specimens only), is one component of a comprehensive MRSA colonization surveillance program. It is not intended to diagnose MRSA infection nor to guide or monitor treatment for MRSA infections.    Studies/Results: Dg Chest Portable 1 View  09/05/2014   CLINICAL DATA:  81 year old diabetic female  with 1 and half week history of shortness of breath. Wheezing. Hypertension. COPD. History of CHF. Prior smoker. Initial encounter.  EXAM: PORTABLE CHEST - 1 VIEW  COMPARISON:  01/01/2014 chest x-ray and chest CT.  FINDINGS: Persisting consolidation left base base may represent atelectasis or infiltrate but needs to be followed with two-view chest until clearance.  Cardiomegaly.  Central pulmonary vascular prominence.  Calcified aorta.  IMPRESSION: Persisting consolidation left base base may represent atelectasis or infiltrate but needs to be followed with two-view chest until this has cleared.  Cardiomegaly.  Central pulmonary vascular prominence.   Electronically Signed   By: Genia Del M.D.   On: 09/05/2014 10:02    Medications:  Prior to  Admission:  Prescriptions prior to admission  Medication Sig Dispense Refill Last Dose  . amLODipine (NORVASC) 10 MG tablet Take 10 mg by mouth daily.     09/04/2014 at Unknown time  . carvedilol (COREG) 12.5 MG tablet Take 12.5 mg by mouth 2 (two) times daily.   09/04/2014 at 900 & 1700  . escitalopram (LEXAPRO) 10 MG tablet Take 10 mg by mouth daily.   09/04/2014 at Unknown time  . guaiFENesin (MUCINEX) 600 MG 12 hr tablet Take 600 mg by mouth 2 (two) times daily.   09/04/2014 at Unknown time  . levETIRAcetam (KEPPRA) 250 MG tablet Take 250 mg by mouth 2 (two) times daily.     09/04/2014 at Unknown time  . loratadine (CLARITIN) 10 MG tablet Take 10 mg by mouth daily.   09/04/2014 at Unknown time  . magnesium oxide (MAG-OX) 400 MG tablet Take 400 mg by mouth 2 (two) times daily.   09/04/2014 at Unknown time  . Memantine HCl ER (NAMENDA XR) 28 MG CP24 Take 28 mg by mouth daily.   09/04/2014 at Unknown time  . omeprazole (PRILOSEC) 20 MG capsule Take 20 mg by mouth daily.   09/04/2014 at Unknown time  . potassium chloride SA (K-DUR,KLOR-CON) 20 MEQ tablet Take 20 mEq by mouth daily.   09/04/2014 at Unknown time  . rivastigmine (EXELON) 4.5 MG capsule Take 4.5 mg by  mouth daily.   09/04/2014 at Unknown time  . simethicone (MYLICON) 80 MG chewable tablet Chew 80 mg by mouth 3 (three) times daily.   09/04/2014 at Unknown time  . sulfamethoxazole-trimethoprim (BACTRIM DS,SEPTRA DS) 800-160 MG per tablet Take 1 tablet by mouth 2 (two) times daily. For 10 days(started 08/29/14)   09/04/2014 at Unknown time  . torsemide (DEMADEX) 100 MG tablet Take 50 mg by mouth daily.   09/04/2014 at Unknown time  . alum & mag hydroxide-simeth (MAALOX/MYLANTA) 200-200-20 MG/5ML suspension Take 30 mLs by mouth every 4 (four) hours as needed for indigestion or heartburn. For indigestion   unknown  . benzonatate (TESSALON) 200 MG capsule Take 200 mg by mouth 3 (three) times daily as needed for cough.    unknown  . traMADol (ULTRAM) 50 MG tablet Take 50 mg by mouth 4 (four) times daily as needed for moderate pain.   unknown   Scheduled: . amLODipine  10 mg Oral Daily  . carvedilol  12.5 mg Oral BID  . enoxaparin (LOVENOX) injection  30 mg Subcutaneous Q24H  . furosemide  60 mg Intravenous Q12H  . insulin aspart  0-5 Units Subcutaneous QHS  . insulin aspart  0-9 Units Subcutaneous TID WC  . ipratropium-albuterol  3 mL Nebulization Q6H  . levETIRAcetam  250 mg Oral BID  . loratadine  10 mg Oral Daily  . magnesium oxide  400 mg Oral BID  . memantine  28 mg Oral Daily  . methylPREDNISolone (SOLU-MEDROL) injection  60 mg Intravenous Q6H  . pantoprazole  40 mg Oral Daily  . potassium chloride SA  20 mEq Oral Daily  . rivastigmine  4.5 mg Oral Daily  . simethicone  80 mg Oral TID  . sodium chloride  3 mL Intravenous Q12H  . sulfamethoxazole-trimethoprim  1 tablet Oral BID  . venlafaxine XR  37.5 mg Oral Daily   Continuous:  OTR:RNHAFB chloride, acetaminophen **OR** acetaminophen, alum & mag hydroxide-simeth, benzonatate, guaiFENesin-dextromethorphan, HYDROcodone-acetaminophen, magnesium citrate, ondansetron **OR** ondansetron (ZOFRAN) IV, senna-docusate, sodium chloride, sorbitol,  traMADol, traZODone  Assesment: She was admitted with acute  on chronic respiratory failure that's a combination of COPD and acute on chronic systolic heart failure. She is better but she still dyspneic with talking sort on think she is ready for discharge from the intensive care unit yet  She has chronic renal failure so we have to be careful with her diuresis  She has dementia at baseline but is about at her baseline now Principal Problem:   Acute on chronic respiratory failure Active Problems:   Chronic renal failure   Dementia   Acute on chronic systolic congestive heart failure   COPD exacerbation   CHF (congestive heart failure)   HOH (hard of hearing)    Plan: Continue with current treatments.    LOS: 1 day   Charliee Krenz L 09/06/2014, 8:52 AM
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
## 2014-09-06 NOTE — Care Management Note (Signed)
Case Management Note  Patient Details  Name: Melissa [name] MRN: [id number] Date of Birth: 08-01-1925    Expected Discharge Date:  09/08/14               Expected Discharge Plan:  Assisted Living / Rest Home  In-House Referral:  Clinical Social Work  Discharge planning Services  CM Consult  Post Acute Care Choice:    Choice offered to:     DME Arranged:    DME Agency:     HH Arranged:    HH Agency:     Status of Service:  Completed, signed off  Medicare Important Message Given:    Date Medicare IM Given:    Medicare IM give by:    Date Additional Medicare IM Given:    Additional Medicare Important Message give by:     If discussed at Long Length of Stay Meetings, dates discussed:    Additional Comments: Pt is from [facility] [place], admitted for resp failure. Pt is mostly wheelchair bound at ALF. PT has recommended PT once pt returns to facility. Pt plans to return to ALF at DC with PT through facility. CSW is aware of DC plan and will arrange for return to facility at DC. No CM needs noted but will cont to follow for DC planning.  Malcolm Metro, RN 09/06/2014, 1:04 PM

## 2014-09-06 NOTE — Evaluation (Signed)
Physical Therapy Evaluation Patient Details Name: Melissa [name] MRN: [id number] DOB: [birth year] Today's Date: 09/06/2014   History of Present Illness  Pt is an72 yo white female with history of COPD, CHF, HTN, DM, and prior episode of respiratory failure, who presented to [facility] after 5-6 days of worsening SOB, DOE. Pt reports that she normally does not need more than 2L of Sup O2 intermittently throughout the day. Pt reports that she has been "WC bound" for about 1 year, sicne she has what she describes as atheroslerotic vascular problems in her legs and that they simply give out.   Clinical Impression  Pt demonstrates good strength, cognition, and balance, however, exhibiting limited activity tolerance related to respiratory distress. Pt is on intermittent 2LO2 at baseline, however on 5L today and desatting to 85% during bed mobility. Pt at baseline uses a WC for mobility and intermittently is able to perform transfers c modified indep, although sometimes requires min-modA. Pt will benefit from skilled PT services once she returns to home, to restore bed mobility and transfers back to prior level of function in activity tolerance and level of independence. No additional PT services recommended for the duration of this admission. PT signing off.     Follow Up Recommendations  (Return to [facility] rehab services to restore to PLOF in transfers and bed mobility. )    Equipment Recommendations  None recommended by PT    Recommendations for Other Services       Precautions / Restrictions Precautions Precautions: Fall Restrictions Weight Bearing Restrictions: No      Mobility  Bed Mobility Overal bed mobility: Needs Assistance Bed Mobility: Supine to Sit;Sit to Supine     Supine to sit: Supervision Sit to supine: Min assist   General bed mobility comments: supervision for multiple lines/leads; pt performs well, but requires some assitance scooting toward HOB.   Transfers                  General transfer comment: Unable to attempt; once pt mobilized to EOB, SaO2 dropped to 85% on 5L O2. DC attempts to transfer at that time.   Ambulation/Gait             General Gait Details: Pt doe snot ambulate.   Stairs            Wheelchair Mobility    Modified Rankin (Stroke Patients Only)       Balance Overall balance assessment: Modified Independent;No apparent balance deficits (not formally assessed)                                           Pertinent Vitals/Pain Pain Assessment:  (reports throat pain related to chronic heavy breathing due to to O2 and SOB. )    Home Living Family/patient expects to be discharged to:: Assisted living ([facility] [place] )               Home Equipment: Gilford Rile - 2 wheels;Wheelchair - manual      Prior Function Level of Independence: Needs assistance         Comments: In WC at baseline for all mobility, is able to transfer to bed/BSC with modifieid independence intermittently, but sometimes requires min-mod assistance.      Hand Dominance        Extremity/Trunk Assessment   Upper Extremity Assessment: Overall WFL for tasks  assessed           Lower Extremity Assessment: Generalized weakness         Communication   Communication: No difficulties  Cognition Arousal/Alertness: Awake/alert Behavior During Therapy: WFL for tasks assessed/performed Overall Cognitive Status: Within Functional Limits for tasks assessed                      General Comments      Exercises        Assessment/Plan    PT Assessment All further PT needs can be met in the next venue of care  PT Diagnosis Other (comment) (Poor activity tolerance.)   PT Problem List Decreased activity tolerance;Decreased mobility  PT Treatment Interventions     PT Goals (Current goals can be found in the Care Plan section) Acute Rehab PT Goals Patient Stated Goal:  Improve and restore functional indep.  PT Goal Formulation: All assessment and education complete, DC therapy Time For Goal Achievement: 09/20/14 Potential to Achieve Goals: Good    Frequency     Barriers to discharge        Co-evaluation               End of Session Equipment Utilized During Treatment: Oxygen Activity Tolerance: Patient tolerated treatment well (Limited by drop in SaO2. ) Patient left: in bed;with call bell/phone within reach;with SCD's reapplied Nurse Communication: Other (comment)         Time: 9689-5702 PT Time Calculation (min) (ACUTE ONLY): 17 min   Charges:   PT Evaluation $Initial PT Evaluation Tier I: 1 Procedure     PT G Codes:        Buccola,Allan C 2014-10-02, 10:54 AM 10:58 AM  Etta Grandchild, PT, DPT [place] License # [id number]

## 2014-09-07 LAB — GLUCOSE, CAPILLARY
GLUCOSE-CAPILLARY: 135 mg/dL — AB (ref 65–99)
GLUCOSE-CAPILLARY: 212 mg/dL — AB (ref 65–99)
Glucose-Capillary: 126 mg/dL — ABNORMAL HIGH (ref 65–99)
Glucose-Capillary: 213 mg/dL — ABNORMAL HIGH (ref 65–99)

## 2014-09-07 LAB — BASIC METABOLIC PANEL
Anion gap: 10 (ref 5–15)
BUN: 48 mg/dL — AB (ref 6–20)
CALCIUM: 8.6 mg/dL — AB (ref 8.9–10.3)
CO2: 25 mmol/L (ref 22–32)
CREATININE: 2.39 mg/dL — AB (ref 0.44–1.00)
Chloride: 99 mmol/L — ABNORMAL LOW (ref 101–111)
GFR calc non Af Amer: 17 mL/min — ABNORMAL LOW (ref >60)
GFR, EST AFRICAN AMERICAN: 20 mL/min — AB (ref >60)
Glucose, Bld: 137 mg/dL — ABNORMAL HIGH (ref 65–99)
Potassium: 4 mmol/L (ref 3.5–5.1)
SODIUM: 134 mmol/L — AB (ref 135–145)

## 2014-09-07 MED ORDER — SULFAMETHOXAZOLE-TRIMETHOPRIM 800-160 MG PO TABS
1.0000 | ORAL_TABLET | Freq: Every day | ORAL | Status: DC
  Administered 2014-09-08 – 2014-09-11 (×4): 1 via ORAL
  Filled 2014-09-07 (×4): qty 1

## 2014-09-07 MED ORDER — FUROSEMIDE 10 MG/ML IJ SOLN
60.0000 mg | Freq: Every day | INTRAMUSCULAR | Status: DC
  Administered 2014-09-07: 60 mg via INTRAVENOUS
  Filled 2014-09-07: qty 6

## 2014-09-07 MED ORDER — METHYLPREDNISOLONE SODIUM SUCC 40 MG IJ SOLR
40.0000 mg | Freq: Four times a day (QID) | INTRAMUSCULAR | Status: DC
  Administered 2014-09-07 – 2014-09-10 (×13): 40 mg via INTRAVENOUS
  Filled 2014-09-07 (×13): qty 1

## 2014-09-07 NOTE — Progress Notes (Signed)
Subjective: She says she feels better. She has no new complaints. Her breathing is better.  Objective: Vital signs in last 24 hours: Temp:  [97.4 F (36.3 C)-98.5 F (36.9 C)] 98.5 F (36.9 C) (09/01 0729) Pulse Rate:  [68-95] 68 (09/01 0600) Resp:  [14-30] 16 (09/01 0600) BP: (89-132)/(43-87) 106/43 mmHg (09/01 0600) SpO2:  [82 %-95 %] 93 % (09/01 0600) FiO2 (%):  [44 %] 44 % (09/01 0400) Weight:  [65.1 kg (143 lb 8.3 oz)] 65.1 kg (143 lb 8.3 oz) (09/01 0500) Weight change: 1.143 kg (2 lb 8.3 oz) Last BM Date: 09/06/14  Intake/Output from previous day: 08/31 0701 - 09/01 0700 In: 1200 [P.O.:960; I.V.:240] Out: 1625 [Urine:1625]  PHYSICAL EXAM General appearance: alert, cooperative and mild distress Resp: clear to auscultation bilaterally Cardio: regular rate and rhythm, S1, S2 normal, no murmur, click, rub or gallop GI: soft, non-tender; bowel sounds normal; no masses,  no organomegaly Extremities: extremities normal, atraumatic, no cyanosis or edema  Lab Results:  Results for orders placed or performed during the hospital encounter of 09/05/14 (from the past 48 hour(s))  CBC with Differential     Status: Abnormal   Collection Time: 09/05/14 10:02 AM  Result Value Ref Range   WBC 8.7 4.0 - 10.5 K/uL   RBC 4.40 3.87 - 5.11 MIL/uL   Hemoglobin 11.2 (L) 12.0 - 15.0 g/dL   HCT 35.3 (L) 36.0 - 46.0 %   MCV 80.2 78.0 - 100.0 fL   MCH 25.5 (L) 26.0 - 34.0 pg   MCHC 31.7 30.0 - 36.0 g/dL   RDW 15.2 11.5 - 15.5 %   Platelets 199 150 - 400 K/uL   Neutrophils Relative % 73 43 - 77 %   Neutro Abs 6.4 1.7 - 7.7 K/uL   Lymphocytes Relative 16 12 - 46 %   Lymphs Abs 1.4 0.7 - 4.0 K/uL   Monocytes Relative 9 3 - 12 %   Monocytes Absolute 0.8 0.1 - 1.0 K/uL   Eosinophils Relative 2 0 - 5 %   Eosinophils Absolute 0.1 0.0 - 0.7 K/uL   Basophils Relative 0 0 - 1 %   Basophils Absolute 0.0 0.0 - 0.1 K/uL  Basic metabolic panel     Status: Abnormal   Collection Time: 09/05/14 10:02  AM  Result Value Ref Range   Sodium 135 135 - 145 mmol/L   Potassium 4.1 3.5 - 5.1 mmol/L   Chloride 102 101 - 111 mmol/L   CO2 25 22 - 32 mmol/L   Glucose, Bld 127 (H) 65 - 99 mg/dL   BUN 26 (H) 6 - 20 mg/dL   Creatinine, Ser 1.84 (H) 0.44 - 1.00 mg/dL   Calcium 8.6 (L) 8.9 - 10.3 mg/dL   GFR calc non Af Amer 24 (L) >60 mL/min   GFR calc Af Amer 27 (L) >60 mL/min    Comment: (NOTE) The eGFR has been calculated using the CKD EPI equation. This calculation has not been validated in all clinical situations. eGFR's persistently <60 mL/min signify possible Chronic Kidney Disease.    Anion gap 8 5 - 15  Troponin I     Status: None   Collection Time: 09/05/14 10:02 AM  Result Value Ref Range   Troponin I <0.03 <0.031 ng/mL    Comment:        NO INDICATION OF MYOCARDIAL INJURY.   Brain natriuretic peptide     Status: Abnormal   Collection Time: 09/05/14 10:02 AM  Result Value Ref  Range   B Natriuretic Peptide 1481.0 (H) 0.0 - 100.0 pg/mL  Magnesium     Status: None   Collection Time: 09/05/14 10:02 AM  Result Value Ref Range   Magnesium 2.4 1.7 - 2.4 mg/dL  Hemoglobin A1c     Status: Abnormal   Collection Time: 09/05/14 10:02 AM  Result Value Ref Range   Hgb A1c MFr Bld 6.0 (H) 4.8 - 5.6 %    Comment: (NOTE)         Pre-diabetes: 5.7 - 6.4         Diabetes: >6.4         Glycemic control for adults with diabetes: <7.0    Mean Plasma Glucose 126 mg/dL    Comment: (NOTE) Performed At: [facility] [place], [place] [id number] Lindon Romp MD ID:[id number]   CBG monitoring, ED     Status: Abnormal   Collection Time: 09/05/14 12:44 PM  Result Value Ref Range   Glucose-Capillary 127 (H) 65 - 99 mg/dL  MRSA PCR Screening     Status: None   Collection Time: 09/05/14  3:27 PM  Result Value Ref Range   MRSA by PCR NEGATIVE NEGATIVE    Comment:        The GeneXpert MRSA Assay (FDA approved for NASAL specimens only), is one component of  a comprehensive MRSA colonization surveillance program. It is not intended to diagnose MRSA infection nor to guide or monitor treatment for MRSA infections.   Glucose, capillary     Status: Abnormal   Collection Time: 09/05/14  4:38 PM  Result Value Ref Range   Glucose-Capillary 152 (H) 65 - 99 mg/dL   Comment 1 Notify RN    Comment 2 Document in Chart   Urinalysis, Routine w reflex microscopic (not at [facility])     Status: Abnormal   Collection Time: 09/05/14  5:50 PM  Result Value Ref Range   Color, Urine YELLOW YELLOW   APPearance CLEAR CLEAR   Specific Gravity, Urine 1.025 1.005 - 1.030   pH 6.0 5.0 - 8.0   Glucose, UA NEGATIVE NEGATIVE mg/dL   Hgb urine dipstick MODERATE (A) NEGATIVE   Bilirubin Urine NEGATIVE NEGATIVE   Ketones, ur NEGATIVE NEGATIVE mg/dL   Protein, ur 30 (A) NEGATIVE mg/dL   Urobilinogen, UA 0.2 0.0 - 1.0 mg/dL   Nitrite NEGATIVE NEGATIVE   Leukocytes, UA NEGATIVE NEGATIVE  Urine microscopic-add on     Status: Abnormal   Collection Time: 09/05/14  5:50 PM  Result Value Ref Range   Squamous Epithelial / LPF RARE RARE   WBC, UA 7-10 <3 WBC/hpf   RBC / HPF 3-6 <3 RBC/hpf   Bacteria, UA FEW (A) RARE  Troponin I     Status: None   Collection Time: 09/05/14  6:10 PM  Result Value Ref Range   Troponin I <0.03 <0.031 ng/mL    Comment:        NO INDICATION OF MYOCARDIAL INJURY.   Glucose, capillary     Status: Abnormal   Collection Time: 09/05/14  9:10 PM  Result Value Ref Range   Glucose-Capillary 216 (H) 65 - 99 mg/dL   Comment 1 Notify RN   Comprehensive metabolic panel     Status: Abnormal   Collection Time: 09/06/14  4:20 AM  Result Value Ref Range   Sodium 138 135 - 145 mmol/L   Potassium 3.8 3.5 - 5.1 mmol/L   Chloride 103 101 - 111 mmol/L   CO2  26 22 - 32 mmol/L   Glucose, Bld 140 (H) 65 - 99 mg/dL   BUN 29 (H) 6 - 20 mg/dL   Creatinine, Ser 1.78 (H) 0.44 - 1.00 mg/dL   Calcium 8.6 (L) 8.9 - 10.3 mg/dL   Total Protein 6.5 6.5 - 8.1 g/dL    Albumin 3.3 (L) 3.5 - 5.0 g/dL   AST 20 15 - 41 U/L   ALT 22 14 - 54 U/L   Alkaline Phosphatase 92 38 - 126 U/L   Total Bilirubin 0.5 0.3 - 1.2 mg/dL   GFR calc non Af Amer 24 (L) >60 mL/min   GFR calc Af Amer 28 (L) >60 mL/min    Comment: (NOTE) The eGFR has been calculated using the CKD EPI equation. This calculation has not been validated in all clinical situations. eGFR's persistently <60 mL/min signify possible Chronic Kidney Disease.    Anion gap 9 5 - 15  CBC     Status: Abnormal   Collection Time: 09/06/14  4:20 AM  Result Value Ref Range   WBC 5.6 4.0 - 10.5 K/uL   RBC 4.26 3.87 - 5.11 MIL/uL   Hemoglobin 10.7 (L) 12.0 - 15.0 g/dL   HCT 33.6 (L) 36.0 - 46.0 %   MCV 78.9 78.0 - 100.0 fL   MCH 25.1 (L) 26.0 - 34.0 pg   MCHC 31.8 30.0 - 36.0 g/dL   RDW 15.0 11.5 - 15.5 %   Platelets 198 150 - 400 K/uL  Troponin I     Status: None   Collection Time: 09/06/14  4:20 AM  Result Value Ref Range   Troponin I 0.03 <0.031 ng/mL    Comment:        NO INDICATION OF MYOCARDIAL INJURY.   Glucose, capillary     Status: Abnormal   Collection Time: 09/06/14  7:29 AM  Result Value Ref Range   Glucose-Capillary 137 (H) 65 - 99 mg/dL   Comment 1 Notify RN    Comment 2 Document in Chart   Glucose, capillary     Status: Abnormal   Collection Time: 09/06/14 11:40 AM  Result Value Ref Range   Glucose-Capillary 135 (H) 65 - 99 mg/dL  Glucose, capillary     Status: Abnormal   Collection Time: 09/06/14  4:40 PM  Result Value Ref Range   Glucose-Capillary 143 (H) 65 - 99 mg/dL   Comment 1 Notify RN    Comment 2 Document in Chart   Glucose, capillary     Status: Abnormal   Collection Time: 09/06/14  9:28 PM  Result Value Ref Range   Glucose-Capillary 156 (H) 65 - 99 mg/dL  Basic metabolic panel     Status: Abnormal   Collection Time: 09/07/14  4:53 AM  Result Value Ref Range   Sodium 134 (L) 135 - 145 mmol/L   Potassium 4.0 3.5 - 5.1 mmol/L   Chloride 99 (L) 101 - 111 mmol/L    CO2 25 22 - 32 mmol/L   Glucose, Bld 137 (H) 65 - 99 mg/dL   BUN 48 (H) 6 - 20 mg/dL   Creatinine, Ser 2.39 (H) 0.44 - 1.00 mg/dL   Calcium 8.6 (L) 8.9 - 10.3 mg/dL   GFR calc non Af Amer 17 (L) >60 mL/min   GFR calc Af Amer 20 (L) >60 mL/min    Comment: (NOTE) The eGFR has been calculated using the CKD EPI equation. This calculation has not been validated in all clinical situations. eGFR's persistently <60  mL/min signify possible Chronic Kidney Disease.    Anion gap 10 5 - 15  Glucose, capillary     Status: Abnormal   Collection Time: 09/07/14  7:21 AM  Result Value Ref Range   Glucose-Capillary 135 (H) 65 - 99 mg/dL   Comment 1 Notify RN    Comment 2 Document in Chart     ABGS No results for input(s): PHART, PO2ART, TCO2, HCO3 in the last 72 hours.  Invalid input(s): PCO2 CULTURES Recent Results (from the past 240 hour(s))  MRSA PCR Screening     Status: None   Collection Time: 09/05/14  3:27 PM  Result Value Ref Range Status   MRSA by PCR NEGATIVE NEGATIVE Final    Comment:        The GeneXpert MRSA Assay (FDA approved for NASAL specimens only), is one component of a comprehensive MRSA colonization surveillance program. It is not intended to diagnose MRSA infection nor to guide or monitor treatment for MRSA infections.    Studies/Results: Dg Chest Portable 1 View  09/05/2014   CLINICAL DATA:  81 year old diabetic female with 1 and half week history of shortness of breath. Wheezing. Hypertension. COPD. History of CHF. Prior smoker. Initial encounter.  EXAM: PORTABLE CHEST - 1 VIEW  COMPARISON:  01/01/2014 chest x-ray and chest CT.  FINDINGS: Persisting consolidation left base base may represent atelectasis or infiltrate but needs to be followed with two-view chest until clearance.  Cardiomegaly.  Central pulmonary vascular prominence.  Calcified aorta.  IMPRESSION: Persisting consolidation left base base may represent atelectasis or infiltrate but needs to be  followed with two-view chest until this has cleared.  Cardiomegaly.  Central pulmonary vascular prominence.   Electronically Signed   By: Genia Del M.D.   On: 09/05/2014 10:02    Medications:  Prior to Admission:  Prescriptions prior to admission  Medication Sig Dispense Refill Last Dose  . amLODipine (NORVASC) 10 MG tablet Take 10 mg by mouth daily.     09/04/2014 at Unknown time  . carvedilol (COREG) 12.5 MG tablet Take 12.5 mg by mouth 2 (two) times daily.   09/04/2014 at 900 & 1700  . escitalopram (LEXAPRO) 10 MG tablet Take 10 mg by mouth daily.   09/04/2014 at Unknown time  . guaiFENesin (MUCINEX) 600 MG 12 hr tablet Take 600 mg by mouth 2 (two) times daily.   09/04/2014 at Unknown time  . levETIRAcetam (KEPPRA) 250 MG tablet Take 250 mg by mouth 2 (two) times daily.     09/04/2014 at Unknown time  . loratadine (CLARITIN) 10 MG tablet Take 10 mg by mouth daily.   09/04/2014 at Unknown time  . magnesium oxide (MAG-OX) 400 MG tablet Take 400 mg by mouth 2 (two) times daily.   09/04/2014 at Unknown time  . Memantine HCl ER (NAMENDA XR) 28 MG CP24 Take 28 mg by mouth daily.   09/04/2014 at Unknown time  . omeprazole (PRILOSEC) 20 MG capsule Take 20 mg by mouth daily.   09/04/2014 at Unknown time  . potassium chloride SA (K-DUR,KLOR-CON) 20 MEQ tablet Take 20 mEq by mouth daily.   09/04/2014 at Unknown time  . rivastigmine (EXELON) 4.5 MG capsule Take 4.5 mg by mouth daily.   09/04/2014 at Unknown time  . simethicone (MYLICON) 80 MG chewable tablet Chew 80 mg by mouth 3 (three) times daily.   09/04/2014 at Unknown time  . sulfamethoxazole-trimethoprim (BACTRIM DS,SEPTRA DS) 800-160 MG per tablet Take 1 tablet by mouth 2 (two) times  daily. For 10 days(started 08/29/14)   09/04/2014 at Unknown time  . torsemide (DEMADEX) 100 MG tablet Take 50 mg by mouth daily.   09/04/2014 at Unknown time  . alum & mag hydroxide-simeth (MAALOX/MYLANTA) 200-200-20 MG/5ML suspension Take 30 mLs by mouth every 4 (four) hours  as needed for indigestion or heartburn. For indigestion   unknown  . benzonatate (TESSALON) 200 MG capsule Take 200 mg by mouth 3 (three) times daily as needed for cough.    unknown  . traMADol (ULTRAM) 50 MG tablet Take 50 mg by mouth 4 (four) times daily as needed for moderate pain.   unknown   Scheduled: . amLODipine  10 mg Oral Daily  . carvedilol  12.5 mg Oral BID  . enoxaparin (LOVENOX) injection  30 mg Subcutaneous Q24H  . furosemide  60 mg Intravenous Daily  . insulin aspart  0-5 Units Subcutaneous QHS  . insulin aspart  0-9 Units Subcutaneous TID WC  . ipratropium-albuterol  3 mL Nebulization Q6H  . levETIRAcetam  250 mg Oral BID  . loratadine  10 mg Oral Daily  . magnesium oxide  400 mg Oral BID  . memantine  28 mg Oral Daily  . methylPREDNISolone (SOLU-MEDROL) injection  40 mg Intravenous Q6H  . pantoprazole  40 mg Oral Daily  . potassium chloride SA  20 mEq Oral Daily  . rivastigmine  4.5 mg Oral Daily  . simethicone  80 mg Oral TID  . sodium chloride  3 mL Intravenous Q12H  . sulfamethoxazole-trimethoprim  1 tablet Oral BID  . venlafaxine XR  37.5 mg Oral Daily   Continuous:  AYO:KHTXHF chloride, acetaminophen **OR** acetaminophen, alum & mag hydroxide-simeth, benzonatate, guaiFENesin-dextromethorphan, HYDROcodone-acetaminophen, magnesium citrate, ondansetron **OR** ondansetron (ZOFRAN) IV, senna-docusate, sodium chloride, sorbitol, traMADol, traZODone  Assesment: She was admitted with acute on chronic respiratory failure which is multifactorial. She has acute on chronic systolic heart failure and has COPD exacerbation. She seems to be improving. Her renal function is not as good. Her I&O  and weight shows that she's actually gained weight but she looks much better. She is not dyspneic at rest now.  She has acute on chronic renal failure now.  She has been on full dose Bactrim and I think we need to cut that back considering her change in renal function. I'm also going to  reduce her Lasix because of her change in renal function.  She had an asymptomatic 24 beat run of wide complex tachycardia. She didn't have any changes in her blood pressure with that and she has a history of paroxysmal atrial fibrillation and I think that may have been atrial fibrillation with aberrancy Principal Problem:   Acute on chronic respiratory failure Active Problems:   Chronic renal failure   Dementia   Acute on chronic systolic congestive heart failure   COPD exacerbation   CHF (congestive heart failure)   HOH (hard of hearing)    Plan: I think she is okay to move out of the ICU. She will need follow-up blood work. She's not ready for discharge. She will have changes made in her medication doses considering her renal dysfunction.    LOS: 2 days   Gillian Meeuwsen L 09/07/2014, 7:31 AM

## 2014-09-07 NOTE — Progress Notes (Signed)
ANTIBIOTIC CONSULT NOTE - INITIAL  Pharmacy Consult for bactrim Indication: UTI  Allergies  Allergen Reactions  . Shellfish Allergy Diarrhea    Severe diarrhea and required hospitalization    Patient Measurements: Height:  (160 cm) Weight: 143 lb 8.3 oz (65.1 kg) IBW/kg (Calculated) : 52.4   Vital Signs: Temp: 98.6 F (37 C) (09/01 1154) Temp Source: Oral (09/01 1154) BP: 123/60 mmHg (09/01 1000) Pulse Rate: 73 (09/01 1000) Intake/Output from previous day: 08/31 0701 - 09/01 0700 In: 1200 [P.O.:960; I.V.:240] Out: 1625 [Urine:1625] Intake/Output from this shift: Total I/O In: 30 [I.V.:30] Out: 900 [Urine:900]  Labs:  Recent Labs  09/05/14 1002 09/06/14 0420 09/07/14 0453  WBC 8.7 5.6  --   HGB 11.2* 10.7*  --   PLT 199 198  --   CREATININE 1.84* 1.78* 2.39*   Estimated Creatinine Clearance: 15.1 mL/min (by C-G formula based on Cr of 2.39). No results for input(s): VANCOTROUGH, VANCOPEAK, VANCORANDOM, GENTTROUGH, GENTPEAK, GENTRANDOM, TOBRATROUGH, TOBRAPEAK, TOBRARND, AMIKACINPEAK, AMIKACINTROU, AMIKACIN in the last 72 hours.   Microbiology: Recent Results (from the past 720 hour(s))  MRSA PCR Screening     Status: None   Collection Time: 09/05/14  3:27 PM  Result Value Ref Range Status   MRSA by PCR NEGATIVE NEGATIVE Final    Comment:        The GeneXpert MRSA Assay (FDA approved for NASAL specimens only), is one component of a comprehensive MRSA colonization surveillance program. It is not intended to diagnose MRSA infection nor to guide or monitor treatment for MRSA infections.     Medical History: Past Medical History  Diagnosis Date  . COPD (chronic obstructive pulmonary disease)   . CHF (congestive heart failure)   . Delirium   . Metabolic alkalosis   . Hypertension   . Diabetes mellitus   . Anxiety   . Depression   . Panic attacks   . Chronic diarrhea of unknown origin   . Gout   . Arthritis   . Dementia   . Renal disorder      Medications:  Prescriptions prior to admission  Medication Sig Dispense Refill Last Dose  . amLODipine (NORVASC) 10 MG tablet Take 10 mg by mouth daily.     09/04/2014 at Unknown time  . carvedilol (COREG) 12.5 MG tablet Take 12.5 mg by mouth 2 (two) times daily.   09/04/2014 at 900 & 1700  . escitalopram (LEXAPRO) 10 MG tablet Take 10 mg by mouth daily.   09/04/2014 at Unknown time  . guaiFENesin (MUCINEX) 600 MG 12 hr tablet Take 600 mg by mouth 2 (two) times daily.   09/04/2014 at Unknown time  . levETIRAcetam (KEPPRA) 250 MG tablet Take 250 mg by mouth 2 (two) times daily.     09/04/2014 at Unknown time  . loratadine (CLARITIN) 10 MG tablet Take 10 mg by mouth daily.   09/04/2014 at Unknown time  . magnesium oxide (MAG-OX) 400 MG tablet Take 400 mg by mouth 2 (two) times daily.   09/04/2014 at Unknown time  . Memantine HCl ER (NAMENDA XR) 28 MG CP24 Take 28 mg by mouth daily.   09/04/2014 at Unknown time  . omeprazole (PRILOSEC) 20 MG capsule Take 20 mg by mouth daily.   09/04/2014 at Unknown time  . potassium chloride SA (K-DUR,KLOR-CON) 20 MEQ tablet Take 20 mEq by mouth daily.   09/04/2014 at Unknown time  . rivastigmine (EXELON) 4.5 MG capsule Take 4.5 mg by mouth daily.   09/04/2014  at Unknown time  . simethicone (MYLICON) 80 MG chewable tablet Chew 80 mg by mouth 3 (three) times daily.   09/04/2014 at Unknown time  . sulfamethoxazole-trimethoprim (BACTRIM DS,SEPTRA DS) 800-160 MG per tablet Take 1 tablet by mouth 2 (two) times daily. For 10 days(started 08/29/14)   09/04/2014 at Unknown time  . torsemide (DEMADEX) 100 MG tablet Take 50 mg by mouth daily.   09/04/2014 at Unknown time  . alum & mag hydroxide-simeth (MAALOX/MYLANTA) 200-200-20 MG/5ML suspension Take 30 mLs by mouth every 4 (four) hours as needed for indigestion or heartburn. For indigestion   unknown  . benzonatate (TESSALON) 200 MG capsule Take 200 mg by mouth 3 (three) times daily as needed for cough.    unknown  . traMADol  (ULTRAM) 50 MG tablet Take 50 mg by mouth 4 (four) times daily as needed for moderate pain.   unknown   Assessment: 81 yo lady to continue bactrim for UTI.  Her SrCr has increased and CrCl now ~17 ml/min.  Goal of Therapy:  Appropriate dose for renal function  Plan:  Bactrim DS 1 tablet daily F/u renal function and clinical course  Talbert Cage Poteet 09/07/2014,4:03 PM

## 2014-09-08 LAB — BASIC METABOLIC PANEL
ANION GAP: 7 (ref 5–15)
BUN: 58 mg/dL — ABNORMAL HIGH (ref 6–20)
CALCIUM: 8.2 mg/dL — AB (ref 8.9–10.3)
CO2: 26 mmol/L (ref 22–32)
CREATININE: 2.13 mg/dL — AB (ref 0.44–1.00)
Chloride: 98 mmol/L — ABNORMAL LOW (ref 101–111)
GFR, EST AFRICAN AMERICAN: 23 mL/min — AB (ref >60)
GFR, EST NON AFRICAN AMERICAN: 20 mL/min — AB (ref >60)
Glucose, Bld: 137 mg/dL — ABNORMAL HIGH (ref 65–99)
Potassium: 4.4 mmol/L (ref 3.5–5.1)
Sodium: 131 mmol/L — ABNORMAL LOW (ref 135–145)

## 2014-09-08 LAB — GLUCOSE, CAPILLARY
Glucose-Capillary: 126 mg/dL — ABNORMAL HIGH (ref 65–99)
Glucose-Capillary: 157 mg/dL — ABNORMAL HIGH (ref 65–99)
Glucose-Capillary: 129 mg/dL — ABNORMAL HIGH (ref 65–99)
Glucose-Capillary: 162 mg/dL — ABNORMAL HIGH (ref 65–99)

## 2014-09-08 LAB — MAGNESIUM: MAGNESIUM: 2.8 mg/dL — AB (ref 1.7–2.4)

## 2014-09-08 MED ORDER — LORAZEPAM 0.5 MG PO TABS
0.5000 mg | ORAL_TABLET | Freq: Every evening | ORAL | Status: DC | PRN
  Administered 2014-09-08 – 2014-09-09 (×2): 0.5 mg via ORAL
  Filled 2014-09-08 (×2): qty 1

## 2014-09-08 MED ORDER — FUROSEMIDE 10 MG/ML IJ SOLN
20.0000 mg | Freq: Every day | INTRAMUSCULAR | Status: DC
  Administered 2014-09-08 – 2014-09-11 (×4): 20 mg via INTRAVENOUS
  Filled 2014-09-08 (×4): qty 2

## 2014-09-08 MED ORDER — IPRATROPIUM-ALBUTEROL 0.5-2.5 (3) MG/3ML IN SOLN
3.0000 mL | Freq: Three times a day (TID) | RESPIRATORY_TRACT | Status: DC
  Administered 2014-09-08 – 2014-09-11 (×8): 3 mL via RESPIRATORY_TRACT
  Filled 2014-09-08 (×9): qty 3

## 2014-09-08 NOTE — Progress Notes (Signed)
Review chart patient has presumably more diastolic and systolic congestive heart failure with acute on chronic renal failure EF is 55-60% with grade 2 diastolic relaxation issues Lasix is decreased to 20 mg today we'll obtain serum magnesium level and be met daily assess renal function and any hypomagnesemia from possible diuresis and wide complex tachycardia Melissa [name] ZTI:[id number] DOB: 1925-09-10 DOA: 09/05/2014 PCP: Melissa Bogus, MD             Physical Exam: Blood pressure 113/54, pulse 74, temperature 98.5 F (36.9 C), temperature source Oral, resp. rate 18, height 5' 3" (1.6 m), weight 143 lb 0.9 oz (64.89 kg), SpO2 93 %. neck no JVD no carotid bruits no thyromegaly lungs show prolonged his return  Face diminished breath sounds at bases no rales wheeze or rhonchi appreciable heart regular rhythm no S3 or S4 auscultated no heaves thrills rubs abdomen soft nontender bowel sounds normoactive   Investigations:  Recent Results (from the past 240 hour(s))  MRSA PCR Screening     Status: None   Collection Time: 09/05/14  3:27 PM  Result Value Ref Range Status   MRSA by PCR NEGATIVE NEGATIVE Final    Comment:        The GeneXpert MRSA Assay (FDA approved for NASAL specimens only), is one component of a comprehensive MRSA colonization surveillance program. It is not intended to diagnose MRSA infection nor to guide or monitor treatment for MRSA infections.      Basic Metabolic Panel:  Recent Labs  09/05/14 1002  09/07/14 0453 09/08/14 0527  NA 135  < > 134* 131*  K 4.1  < > 4.0 4.4  CL 102  < > 99* 98*  CO2 25  < > 25 26  GLUCOSE 127*  < > 137* 137*  BUN 26*  < > 48* 58*  CREATININE 1.84*  < > 2.39* 2.13*  CALCIUM 8.6*  < > 8.6* 8.2*  MG 2.4  --   --   --   < > = values in this interval not displayed. Liver Function Tests:  Recent Labs  09/06/14 0420  AST 20  ALT 22  ALKPHOS 92  BILITOT 0.5  PROT 6.5  ALBUMIN 3.3*     CBC:  Recent Labs  09/05/14 1002 09/06/14 0420  WBC 8.7 5.6  NEUTROABS 6.4  --   HGB 11.2* 10.7*  HCT 35.3* 33.6*  MCV 80.2 78.9  PLT 199 198    No results found.    Medications:  Impression:  Principal Problem:   Acute on chronic respiratory failure Active Problems:   Chronic renal failure   Dementia   Acute on chronic systolic congestive heart failure   COPD exacerbation   CHF (congestive heart failure)   HOH (hard of hearing)     Plan: Decrease Lasix to 20. Serum magnesium and be met to monitor renal function patient appears anxious will consider angiolytic Consultants:    Procedures   Antibiotics:                   Code Status:  Family Communication:    Disposition Plan see plan above  Time spent: 30 minutes  LOS: 3 days   Melissa [name] M   09/08/2014, 7:43 AM

## 2014-09-08 NOTE — Care Management Important Message (Signed)
Important Message  Patient Details  Name: SHEKERA BEAVERS MRN: [id number] Date of Birth: Jun 07, 1925   Medicare Important Message Given:  Yes-second notification given    Malcolm Metro, RN 09/08/2014, 11:34 AM

## 2014-09-08 NOTE — Clinical Social Work Note (Signed)
CSW spoke with Melissa [name] at [place]. CSW advised that patient may discharge on Sunday. Melissa [name] advised that this was probably fine and that she was on her way to visit patient.    Melissa [name], [place] [phone]

## 2014-09-08 NOTE — Progress Notes (Signed)
Dr. Janna Arch notified of the patients c/o insomnia even with the prescribed dose of trazodone.  Pt stated that she thought it may be from her solumedrol dose.  MD stated that he decreased the dose today of steroids today and he also gave new orders.  They will be followed.

## 2014-09-09 LAB — BASIC METABOLIC PANEL
Anion gap: 9 (ref 5–15)
BUN: 60 mg/dL — AB (ref 6–20)
CHLORIDE: 100 mmol/L — AB (ref 101–111)
CO2: 26 mmol/L (ref 22–32)
CREATININE: 1.9 mg/dL — AB (ref 0.44–1.00)
Calcium: 8.5 mg/dL — ABNORMAL LOW (ref 8.9–10.3)
GFR calc Af Amer: 26 mL/min — ABNORMAL LOW (ref >60)
GFR calc non Af Amer: 23 mL/min — ABNORMAL LOW (ref >60)
GLUCOSE: 132 mg/dL — AB (ref 65–99)
POTASSIUM: 5 mmol/L (ref 3.5–5.1)
Sodium: 135 mmol/L (ref 135–145)

## 2014-09-09 LAB — GLUCOSE, CAPILLARY
GLUCOSE-CAPILLARY: 134 mg/dL — AB (ref 65–99)
GLUCOSE-CAPILLARY: 197 mg/dL — AB (ref 65–99)
Glucose-Capillary: 175 mg/dL — ABNORMAL HIGH (ref 65–99)
Glucose-Capillary: 147 mg/dL — ABNORMAL HIGH (ref 65–99)

## 2014-09-09 NOTE — Progress Notes (Signed)
Patient has renal function markedly improved with significant reduction in diabetics she has presumably mostly diastolic heart failure her EF is 85-88% and systolic function with grade 2 diastolic dysfunction per echo is overly she needs more intravascular volume and control of heart rate to optimize for cardiac output and maintain renal perfusion Melissa [name]:[id number] DOB: 1925-12-09 DOA: 09/05/2014 PCP: Alonza Bogus, MD             Physical Exam: Blood pressure 120/55, pulse 87, temperature 98 F (36.7 C), temperature source Oral, resp. rate 18, height 5' 3"  (1.6 [name]), weight 152 lb 4.8 oz (69.083 kg), SpO2 93 %. neck no JVD no carotid bruits no thyromegaly lungs show diminished breath sounds in the bases no rales appreciable rhonchi scattered bilaterally no wheezes appreciable heart regular rhythm no S3-S4 no heaves thrills rubs abdomen soft nontender bowel sounds normoactive   Investigations:  Recent Results (from the past 240 hour(s))  MRSA PCR Screening     Status: None   Collection Time: 09/05/14  3:27 PM  Result Value Ref Range Status   MRSA by PCR NEGATIVE NEGATIVE Final    Comment:        The GeneXpert MRSA Assay (FDA approved for NASAL specimens only), is one component of a comprehensive MRSA colonization surveillance program. It is not intended to diagnose MRSA infection nor to guide or monitor treatment for MRSA infections.      Basic Metabolic Panel:  Recent Labs  09/08/14 0527 09/09/14 0555  NA 131* 135  K 4.4 5.0  CL 98* 100*  CO2 26 26  GLUCOSE 137* 132*  BUN 58* 60*  CREATININE 2.13* 1.90*  CALCIUM 8.2* 8.5*  MG 2.8*  --    Liver Function Tests: No results for input(s): AST, ALT, ALKPHOS, BILITOT, PROT, ALBUMIN in the last 72 hours.   CBC: No results for input(s): WBC, NEUTROABS, HGB, HCT, MCV, PLT in the last 72 hours.  No results found.    Medications:   Impression:  Principal Problem:   Acute on chronic respiratory  failure Active Problems:   Chronic renal failure   Dementia   Acute on chronic systolic congestive heart failure   COPD exacerbation   CHF (congestive heart failure)   HOH (hard of hearing)     Plan: Continue with Lasix 20 by mouth daily monitor be met in a.[name]. for creatinine and electrolytes she is hemodynamically stable. Incident discharged to [place] in a.[name]. patient was given Ativan 0.5 mg at bedtime last night but states she slept wonderfully  Consultants:    Procedures   Antibiotics:                   Code Status:  Family Communication:    Disposition Plan see plan above  Time spent: 30 minutes   LOS: 4 days   Melissa [name]   09/09/2014, 10:38 AM

## 2014-09-10 LAB — GLUCOSE, CAPILLARY
GLUCOSE-CAPILLARY: 166 mg/dL — AB (ref 65–99)
GLUCOSE-CAPILLARY: 191 mg/dL — AB (ref 65–99)
Glucose-Capillary: 140 mg/dL — ABNORMAL HIGH (ref 65–99)
Glucose-Capillary: 142 mg/dL — ABNORMAL HIGH (ref 65–99)

## 2014-09-10 LAB — BASIC METABOLIC PANEL
ANION GAP: 8 (ref 5–15)
BUN: 56 mg/dL — ABNORMAL HIGH (ref 6–20)
CALCIUM: 8.3 mg/dL — AB (ref 8.9–10.3)
CHLORIDE: 102 mmol/L (ref 101–111)
CO2: 26 mmol/L (ref 22–32)
Creatinine, Ser: 1.65 mg/dL — ABNORMAL HIGH (ref 0.44–1.00)
GFR calc non Af Amer: 27 mL/min — ABNORMAL LOW (ref >60)
GFR, EST AFRICAN AMERICAN: 31 mL/min — AB (ref >60)
GLUCOSE: 138 mg/dL — AB (ref 65–99)
POTASSIUM: 4.5 mmol/L (ref 3.5–5.1)
Sodium: 136 mmol/L (ref 135–145)

## 2014-09-10 MED ORDER — PREDNISONE 20 MG PO TABS
20.0000 mg | ORAL_TABLET | Freq: Every day | ORAL | Status: DC
  Administered 2014-09-11: 20 mg via ORAL
  Filled 2014-09-10: qty 1

## 2014-09-10 NOTE — Progress Notes (Signed)
Patient's renal function has returned to better than baseline creatinine 1.65 with increased fluids and decreased diaphoretic echo reveals systolic function and between 5 and 60% and she has mainly diastolic dysfunction and requires higher intravascular volume to facilitate renal perfusion.with the patient about discharge tomorrow and she was agreeable we will make arrangements Melissa [name] ZOX:[id number] DOB: 1925/07/14 DOA: 09/05/2014 PCP: Fredirick Maudlin, MD             Physical Exam: Blood pressure 145/56, pulse 83, temperature 98.3 F (36.8 C), temperature source Oral, resp. rate 18, height  (1.6 m), weight 146 lb 1.6 oz (66.271 kg), SpO2 95 %. lungs diminished breath sounds in the bases no rales wheeze rhonchi appreciable heart regular rhythm no S3-S4 knee feels rubs abdomen soft nontender bowel sounds normoactive   Investigations:  Recent Results (from the past 240 hour(s))  MRSA PCR Screening     Status: None   Collection Time: 09/05/14  3:27 PM  Result Value Ref Range Status   MRSA by PCR NEGATIVE NEGATIVE Final    Comment:        The GeneXpert MRSA Assay (FDA approved for NASAL specimens only), is one component of a comprehensive MRSA colonization surveillance program. It is not intended to diagnose MRSA infection nor to guide or monitor treatment for MRSA infections.      Basic Metabolic Panel:  Recent Labs  81/19/14 0527 09/09/14 0555 09/10/14 0551  NA 131* 135 136  K 4.4 5.0 4.5  CL 98* 100* 102  CO2 GLUCOSE 137* 132* 138*  BUN 58* 60* 56*  CREATININE 2.13* 1.90* 1.65*  CALCIUM 8.2* 8.5* 8.3*  MG 2.8*  --   --    Liver Function Tests: No results for input(s): AST, ALT, ALKPHOS, BILITOT, PROT, ALBUMIN in the last 72 hours.   CBC: No results for input(s): WBC, NEUTROABS, HGB, HCT, MCV, PLT in the last 72 hours.  No results found.    Medications:   Impression:  Principal Problem:   Acute on chronic respiratory  failure Active Problems:   Chronic renal failure   Dementia   Acute on chronic systolic congestive heart failure   COPD exacerbation   CHF (congestive heart failure)   HOH (hard of hearing)     Plan: Continue low-dose Lasix only on a Bmet in a.m. consider discharge in a.m.  Consultants:    Procedures   Antibiotics:                   Code Status:   Family Communication:  I spoke with patient today and she agrees to discharge in a.m.  Disposition Plan see plan above  Time spent: 30 minutes   LOS: 5 days   Marcha Licklider M   09/10/2014, 1:07 PM

## 2014-09-10 NOTE — Progress Notes (Signed)
Contacted [facility] to inform of patient's discharge and to arrange transport for patient. Informed by Janelle Floor that Chip Boer should be called in the a.m. To arrange transport for patient.

## 2014-09-11 LAB — BASIC METABOLIC PANEL
ANION GAP: 7 (ref 5–15)
BUN: 49 mg/dL — AB (ref 6–20)
CALCIUM: 8.2 mg/dL — AB (ref 8.9–10.3)
CO2: 25 mmol/L (ref 22–32)
Chloride: 104 mmol/L (ref 101–111)
Creatinine, Ser: 1.37 mg/dL — ABNORMAL HIGH (ref 0.44–1.00)
GFR calc Af Amer: 39 mL/min — ABNORMAL LOW (ref >60)
GFR, EST NON AFRICAN AMERICAN: 34 mL/min — AB (ref >60)
GLUCOSE: 102 mg/dL — AB (ref 65–99)
Potassium: 4.3 mmol/L (ref 3.5–5.1)
Sodium: 136 mmol/L (ref 135–145)

## 2014-09-11 LAB — GLUCOSE, CAPILLARY
GLUCOSE-CAPILLARY: 165 mg/dL — AB (ref 65–99)
Glucose-Capillary: 98 mg/dL (ref 65–99)

## 2014-09-11 MED ORDER — FUROSEMIDE 20 MG PO TABS: 20.0000 mg | ORAL_TABLET | Freq: Every day | ORAL | Status: DC

## 2014-09-11 MED ORDER — FUROSEMIDE 10 MG/ML IJ SOLN: 20.0000 mg | Freq: Every day | INTRAMUSCULAR | Status: DC

## 2014-09-11 MED ORDER — PREDNISONE 20 MG PO TABS: 20.0000 mg | ORAL_TABLET | Freq: Every day | ORAL | Status: DC

## 2014-09-11 MED ORDER — LORAZEPAM 0.5 MG PO TABS: 0.5000 mg | ORAL_TABLET | Freq: Every day | ORAL | Status: DC

## 2014-09-11 NOTE — Care Management Note (Signed)
Case Management Note  Patient Details  Name: Melissa [name] MRN: [id number] Date of Birth: 02/07/1925  Expected Discharge Date:  09/08/14               Expected Discharge Plan:  Assisted Living / Rest Home  In-House Referral:  Clinical Social Work  Discharge planning Services  CM Consult  Post Acute Care Choice:    Choice offered to:     DME Arranged:    DME Agency:     HH Arranged:    HH Agency:     Status of Service:  Completed, signed off  Medicare Important Message Given:  Yes-third notification given Date Medicare IM Given:    Medicare IM give by:    Date Additional Medicare IM Given:    Additional Medicare Important Message give by:     If discussed at Long Length of Stay Meetings, dates discussed:    Additional Comments: Pt discharging back to [facility] today. CSW has arranged for return to facility. No CM needs noted.  Malcolm Metro, RN 09/11/2014, 1:33 PM

## 2014-09-11 NOTE — Discharge Summary (Signed)
Physician Discharge Summary  YAHAYRA GEIS OZH:[id number] DOB: January 03, 1926 DOA: 09/05/2014  PCP: Fredirick Maudlin, MD  Admit date: 09/05/2014 Discharge date: 09/11/2014   Recommendations for Outpatient Follow-up:  Patient is instructed to go home and decrease the dose it dosage of Lasix to 20 mg by mouth daily her renal function has returned to baseline her congestive heart failure is mainly diastolic or systolic ejection fraction is 62-95% she likewise has underlying COPD and anxiety she'll follow-up with Dr. Juanetta Gosling within one week's time Discharge Diagnoses:  Principal Problem:   Acute on chronic respiratory failure Active Problems:   Chronic renal failure   Dementia   Acute on chronic systolic congestive heart failure   COPD exacerbation   CHF (congestive heart failure)   HOH (hard of hearing)   Discharge Condition: Good and improving  Filed Weights   09/09/14 0655 09/10/14 0432 09/11/14 0528  Weight: 152 lb 4.8 oz (69.083 kg) 146 lb 1.6 oz (66.271 kg) 145 lb 14.4 oz (66.18 kg)    History of present illness:  Patient was admitted with increasing dyspnea anxiety COPD exacerbation placed on nebulizer therapy and sterile this seemed to resolve over for 5 day. 2-D echo revealed essentially normal systolic function with grade 2 diastolic dysfunction she was given and diuresis and had adverse function of her kidneys present during his hospital stay she was even IV fluids and renal function returned to baseline creatinine at time of discharge was 1.65 Lasix dose was reduced to 20 mg by mouth daily and she will return to [facility] and follow-up with Dr. Juanetta Gosling within a week's time  Hospital Course:  See history of present illness above  Procedures:    Consultations:   Discharge Instructions     Medication List    ASK your doctor about these medications        alum & mag hydroxide-simeth 200-200-20 MG/5ML suspension  Commonly known as:  MAALOX/MYLANTA  Take 30  mLs by mouth every 4 (four) hours as needed for indigestion or heartburn. For indigestion     amLODipine 10 MG tablet  Commonly known as:  NORVASC  Take 10 mg by mouth daily.     benzonatate 200 MG capsule  Commonly known as:  TESSALON  Take 200 mg by mouth 3 (three) times daily as needed for cough.     carvedilol 12.5 MG tablet  Commonly known as:  COREG  Take 12.5 mg by mouth 2 (two) times daily.     escitalopram 10 MG tablet  Commonly known as:  LEXAPRO  Take 10 mg by mouth daily.     guaiFENesin 600 MG 12 hr tablet  Commonly known as:  MUCINEX  Take 600 mg by mouth 2 (two) times daily.     levETIRAcetam 250 MG tablet  Commonly known as:  KEPPRA  Take 250 mg by mouth 2 (two) times daily.     loratadine 10 MG tablet  Commonly known as:  CLARITIN  Take 10 mg by mouth daily.     magnesium oxide 400 MG tablet  Commonly known as:  MAG-OX  Take 400 mg by mouth 2 (two) times daily.     NAMENDA XR 28 MG Cp24 24 hr capsule  Generic drug:  memantine  Take 28 mg by mouth daily.     omeprazole 20 MG capsule  Commonly known as:  PRILOSEC  Take 20 mg by mouth daily.     potassium chloride SA 20 MEQ tablet  Commonly known  as:  K-DUR,KLOR-CON  Take 20 mEq by mouth daily.     rivastigmine 4.5 MG capsule  Commonly known as:  EXELON  Take 4.5 mg by mouth daily.     simethicone 80 MG chewable tablet  Commonly known as:  MYLICON  Chew 80 mg by mouth 3 (three) times daily.     sulfamethoxazole-trimethoprim 800-160 MG per tablet  Commonly known as:  BACTRIM DS,SEPTRA DS  Take 1 tablet by mouth 2 (two) times daily. For 10 days(started 08/29/14)     torsemide 100 MG tablet  Commonly known as:  DEMADEX  Take 50 mg by mouth daily.     traMADol 50 MG tablet  Commonly known as:  ULTRAM  Take 50 mg by mouth 4 (four) times daily as needed for moderate pain.       Allergies  Allergen Reactions  . Shellfish Allergy Diarrhea    Severe diarrhea and required hospitalization       The results of significant diagnostics from this hospitalization (including imaging, microbiology, ancillary and laboratory) are listed below for reference.    Significant Diagnostic Studies: Dg Chest Portable 1 View  09/05/2014   CLINICAL DATA:  81 year old diabetic female with 1 and half week history of shortness of breath. Wheezing. Hypertension. COPD. History of CHF. Prior smoker. Initial encounter.  EXAM: PORTABLE CHEST - 1 VIEW  COMPARISON:  01/01/2014 chest x-ray and chest CT.  FINDINGS: Persisting consolidation left base base may represent atelectasis or infiltrate but needs to be followed with two-view chest until clearance.  Cardiomegaly.  Central pulmonary vascular prominence.  Calcified aorta.  IMPRESSION: Persisting consolidation left base base may represent atelectasis or infiltrate but needs to be followed with two-view chest until this has cleared.  Cardiomegaly.  Central pulmonary vascular prominence.   Electronically Signed   By: Lacy Duverney M.D.   On: 09/05/2014 10:02    Microbiology: Recent Results (from the past 240 hour(s))  MRSA PCR Screening     Status: None   Collection Time: 09/05/14  3:27 PM  Result Value Ref Range Status   MRSA by PCR NEGATIVE NEGATIVE Final    Comment:        The GeneXpert MRSA Assay (FDA approved for NASAL specimens only), is one component of a comprehensive MRSA colonization surveillance program. It is not intended to diagnose MRSA infection nor to guide or monitor treatment for MRSA infections.      Labs: Basic Metabolic Panel:  Recent Labs Lab 09/05/14 1002  09/07/14 0453 09/08/14 0527 09/09/14 0555 09/10/14 0551 09/11/14 0527  NA 135  < > 134* 131* 135 136 136  K 4.1  < > 4.0 4.4 5.0 4.5 4.3  CL 102  < > 99* 98* 100* 102 104  CO2 25  < > GLUCOSE 127*  < > 137* 137* 132* 138* 102*  BUN 26*  < > 48* 58* 60* 56* 49*  CREATININE 1.84*  < > 2.39* 2.13* 1.90* 1.65* 1.37*  CALCIUM 8.6*  < > 8.6* 8.2*  8.5* 8.3* 8.2*  MG 2.4  --   --  2.8*  --   --   --   < > = values in this interval not displayed. Liver Function Tests:  Recent Labs Lab 09/06/14 0420  AST 20  ALT 22  ALKPHOS 92  BILITOT 0.5  PROT 6.5  ALBUMIN 3.3*   No results for input(s): LIPASE, AMYLASE in the last 168 hours. No results for input(s):  AMMONIA in the last 168 hours. CBC:  Recent Labs Lab 09/05/14 1002 09/06/14 0420  WBC 8.7 5.6  NEUTROABS 6.4  --   HGB 11.2* 10.7*  HCT 35.3* 33.6*  MCV 80.2 78.9  PLT 199 198   Cardiac Enzymes:  Recent Labs Lab 09/05/14 1002 09/05/14 1810 09/06/14 0420  TROPONINI <0.03 <0.03 0.03   BNP: BNP (last 3 results)  Recent Labs  09/05/14 1002  BNP 1481.0*    ProBNP (last 3 results) No results for input(s): PROBNP in the last 8760 hours.  CBG:  Recent Labs Lab 09/10/14 0744 09/10/14 1208 09/10/14 1556 09/10/14 2057 09/11/14 0740  GLUCAP 140* 166* 191* 142* 98       Signed:  Elinor Kleine M  [facility] Pager: [phone] 09/11/2014, 11:20 AM

## 2014-09-11 NOTE — Progress Notes (Signed)
1437 d/c instructions and paperwork given to patient/[facility] nurse. IV catheter removed from LEFT AC, catheter intact, no s/s of infection noted, patient tolerated well w/no c/o pain or discomfort noted. Foley catheter removed, catheter intact, 500cc pale yellow urine noted in catheter drainage bag, patient tolerated well w/no c/o pain or discomfort noted. Tele box and wiring removed from patient and central tele notified and made aware. [facility] to provide transportation for patient and is scheduled to pick patient up after 3pm today.

## 2014-09-11 NOTE — Care Management Important Message (Signed)
Important Message  Patient Details  Name: Melissa [name] MRN: [id number] Date of Birth: [birth year]   Medicare Important Message Given:  Yes-third notification given    Malcolm Metro, RN 09/11/2014, 1:33 PM

## 2014-09-11 NOTE — Progress Notes (Signed)
1530 [facility] transportation arrived to pick up patient. Patient to transport to [facility] with O2 5L via [place]. Patient confirmed having all of her belongings together and ready for d/c.

## 2014-09-11 NOTE — Clinical Social Work Note (Signed)
Pt d/c today back to Home. Pt and facility aware and agreeable. Angela at facility reports pt can be picked up after 3. CSW left voicemail for pt's son, Chrissie Noa. D/C summary and FL2 faxed.  Derenda Fennel, LCSW [phone]

## 2014-09-23 ENCOUNTER — Emergency Department (HOSPITAL_COMMUNITY): Payer: Medicare Other

## 2014-09-23 ENCOUNTER — Encounter (HOSPITAL_COMMUNITY): Payer: Self-pay | Admitting: Emergency Medicine

## 2014-09-23 ENCOUNTER — Emergency Department (HOSPITAL_COMMUNITY)
Admission: EM | Admit: 2014-09-23 | Discharge: 2014-09-23 | Disposition: A | Discharge status: Home / Self Care | Payer: Medicare Other | Attending: Emergency Medicine | Admitting: Emergency Medicine

## 2014-09-23 DIAGNOSIS — M1990 Unspecified osteoarthritis, unspecified site: Secondary | ICD-10-CM | POA: Diagnosis not present

## 2014-09-23 DIAGNOSIS — F410 Panic disorder [episodic paroxysmal anxiety] without agoraphobia: Secondary | ICD-10-CM | POA: Diagnosis not present

## 2014-09-23 DIAGNOSIS — Z87448 Personal history of other diseases of urinary system: Secondary | ICD-10-CM | POA: Insufficient documentation

## 2014-09-23 DIAGNOSIS — J449 Chronic obstructive pulmonary disease, unspecified: Secondary | ICD-10-CM | POA: Insufficient documentation

## 2014-09-23 DIAGNOSIS — S8991XA Unspecified injury of right lower leg, initial encounter: Secondary | ICD-10-CM | POA: Diagnosis not present

## 2014-09-23 DIAGNOSIS — Z7952 Long term (current) use of systemic steroids: Secondary | ICD-10-CM | POA: Insufficient documentation

## 2014-09-23 DIAGNOSIS — F0390 Unspecified dementia without behavioral disturbance: Secondary | ICD-10-CM | POA: Diagnosis not present

## 2014-09-23 DIAGNOSIS — E119 Type 2 diabetes mellitus without complications: Secondary | ICD-10-CM | POA: Diagnosis not present

## 2014-09-23 DIAGNOSIS — I509 Heart failure, unspecified: Secondary | ICD-10-CM | POA: Insufficient documentation

## 2014-09-23 DIAGNOSIS — S0990XA Unspecified injury of head, initial encounter: Secondary | ICD-10-CM | POA: Insufficient documentation

## 2014-09-23 DIAGNOSIS — Z79899 Other long term (current) drug therapy: Secondary | ICD-10-CM | POA: Diagnosis not present

## 2014-09-23 DIAGNOSIS — Y9389 Activity, other specified: Secondary | ICD-10-CM | POA: Insufficient documentation

## 2014-09-23 DIAGNOSIS — W19XXXA Unspecified fall, initial encounter: Secondary | ICD-10-CM

## 2014-09-23 DIAGNOSIS — Y998 Other external cause status: Secondary | ICD-10-CM | POA: Diagnosis not present

## 2014-09-23 DIAGNOSIS — W01198A Fall on same level from slipping, tripping and stumbling with subsequent striking against other object, initial encounter: Secondary | ICD-10-CM | POA: Insufficient documentation

## 2014-09-23 DIAGNOSIS — Z87891 Personal history of nicotine dependence: Secondary | ICD-10-CM | POA: Insufficient documentation

## 2014-09-23 DIAGNOSIS — S3992XA Unspecified injury of lower back, initial encounter: Secondary | ICD-10-CM | POA: Insufficient documentation

## 2014-09-23 DIAGNOSIS — M109 Gout, unspecified: Secondary | ICD-10-CM | POA: Insufficient documentation

## 2014-09-23 DIAGNOSIS — I10 Essential (primary) hypertension: Secondary | ICD-10-CM | POA: Diagnosis not present

## 2014-09-23 DIAGNOSIS — S199XXA Unspecified injury of neck, initial encounter: Secondary | ICD-10-CM | POA: Diagnosis not present

## 2014-09-23 DIAGNOSIS — Y9289 Other specified places as the place of occurrence of the external cause: Secondary | ICD-10-CM | POA: Insufficient documentation

## 2014-09-23 DIAGNOSIS — F329 Major depressive disorder, single episode, unspecified: Secondary | ICD-10-CM | POA: Diagnosis not present

## 2014-09-23 LAB — CBC WITH DIFFERENTIAL/PLATELET
BASOS ABS: 0 10*3/uL (ref 0.0–0.1)
Basophils Relative: 0 %
Eosinophils Absolute: 0.1 10*3/uL (ref 0.0–0.7)
Eosinophils Relative: 1 %
HEMATOCRIT: 40.7 % (ref 36.0–46.0)
HEMOGLOBIN: 12.7 g/dL (ref 12.0–15.0)
LYMPHS PCT: 7 %
Lymphs Abs: 0.9 10*3/uL (ref 0.7–4.0)
MCH: 25.7 pg — ABNORMAL LOW (ref 26.0–34.0)
MCHC: 31.2 g/dL (ref 30.0–36.0)
MCV: 82.2 fL (ref 78.0–100.0)
Monocytes Absolute: 0.5 10*3/uL (ref 0.1–1.0)
Monocytes Relative: 4 %
NEUTROS ABS: 11.1 10*3/uL — AB (ref 1.7–7.7)
Neutrophils Relative %: 88 %
PLATELETS: 173 10*3/uL (ref 150–400)
RBC: 4.95 MIL/uL (ref 3.87–5.11)
RDW: 15.9 % — ABNORMAL HIGH (ref 11.5–15.5)
WBC: 12.6 10*3/uL — AB (ref 4.0–10.5)

## 2014-09-23 LAB — BASIC METABOLIC PANEL
ANION GAP: 9 (ref 5–15)
BUN: 21 mg/dL — ABNORMAL HIGH (ref 6–20)
CHLORIDE: 102 mmol/L (ref 101–111)
CO2: 29 mmol/L (ref 22–32)
Calcium: 8.5 mg/dL — ABNORMAL LOW (ref 8.9–10.3)
Creatinine, Ser: 1.37 mg/dL — ABNORMAL HIGH (ref 0.44–1.00)
GFR calc Af Amer: 39 mL/min — ABNORMAL LOW (ref >60)
GFR, EST NON AFRICAN AMERICAN: 34 mL/min — AB (ref >60)
GLUCOSE: 144 mg/dL — AB (ref 65–99)
POTASSIUM: 3.6 mmol/L (ref 3.5–5.1)
Sodium: 140 mmol/L (ref 135–145)

## 2014-09-23 MED ORDER — HYDROCODONE-ACETAMINOPHEN 5-325 MG PO TABS
1.0000 | ORAL_TABLET | Freq: Once | ORAL | Status: AC
  Administered 2014-09-23: 1 via ORAL
  Filled 2014-09-23: qty 1

## 2014-09-23 NOTE — ED Notes (Signed)
Patient removed from backboard per Dr. Clarene  verbal order.

## 2014-09-23 NOTE — ED Provider Notes (Signed)
CSN: [id number]     Arrival date & time 09/23/14  2004 History  This chart was scribe for Blane Ohara, MD by Angelene Giovanni, ED Scribe. The patient was seen in room APA19/APA19 and the patient's care was started at 8:56 PM.    Chief Complaint  Patient presents with  . Fall  . Head Injury   Patient is a 81 y.o. female presenting with fall and head injury. The history is provided by the patient. No language interpreter was used.  Fall This is a new problem. The current episode started 12 to 24 hours ago. The problem occurs constantly. The problem has been gradually worsening. Pertinent negatives include no chest pain and no abdominal pain. Headaches: back of head pain. Nothing aggravates the symptoms. Nothing relieves the symptoms. She has tried nothing for the symptoms.  Head Injury Location:  Occipital Mechanism of injury: fall   Pain details:    Severity:  Moderate   Duration:  3 hours   Timing:  Constant   Progression:  Worsening Chronicity:  New Relieved by:  None tried Worsened by:  Nothing tried Ineffective treatments:  None tried Associated symptoms: neck pain   Associated symptoms: no blurred vision, no difficulty breathing, no double vision, no focal weakness, no loss of consciousness, no nausea and no vomiting  Headaches: back of head pain.   Risk factors: being elderly    HPI Comments: Melissa [name] is a 81 y.o. female with a hx of COPD, CHF,DM, HTN, and Dementia who presents to the Emergency Department status post fall that occurred tonight PTA. She reports that she slipped when she was getting out of bed and fell straight on her back where she was able to move or get help for approx 15 minutes. She reports associated moderate pain to the back of her head, upper back pain, neck pain, and right leg pain. She denies any nausea/vomiting, visual changes, CP, abdominal pain, blood in stool or weakness/numbness in her arms and legs. She reports that she was in the hospital due  to CHF for 5 days recently and discharged on 09/11/14. She states that she is normally on 2L of Oxygen at home and she not been using lately because has been feeling better.   Past Medical History  Diagnosis Date  . COPD (chronic obstructive pulmonary disease)   . CHF (congestive heart failure)   . Delirium   . Metabolic alkalosis   . Hypertension   . Diabetes mellitus   . Anxiety   . Depression   . Panic attacks   . Chronic diarrhea of unknown origin   . Gout   . Arthritis   . Dementia   . Renal disorder    Past Surgical History  Procedure Laterality Date  . Inguinal hernia repair      right   History reviewed. No pertinent family history. Social History  Substance Use Topics  . Smoking status: Former Games developer  . Smokeless tobacco: None  . Alcohol Use: No   OB History    No data available     Review of Systems  Eyes: Negative for blurred vision and double vision.  Cardiovascular: Negative for chest pain.  Gastrointestinal: Negative for nausea, vomiting and abdominal pain.  Musculoskeletal: Positive for back pain, arthralgias and neck pain.  Neurological: Negative for focal weakness and loss of consciousness. Headaches: back of head pain.  All other systems reviewed and are negative.     Allergies  Shellfish allergy  Home Medications  Prior to Admission medications   Medication Sig Start Date End Date Taking? Authorizing Provider  alum & mag hydroxide-simeth (MAALOX/MYLANTA) 200-200-20 MG/5ML suspension Take 30 mLs by mouth every 4 (four) hours as needed for indigestion or heartburn. For indigestion    Historical Provider, MD  amLODipine (NORVASC) 10 MG tablet Take 10 mg by mouth daily.      Historical Provider, MD  benzonatate (TESSALON) 200 MG capsule Take 200 mg by mouth 3 (three) times daily as needed for cough.     Historical Provider, MD  carvedilol (COREG) 12.5 MG tablet Take 12.5 mg by mouth 2 (two) times daily.    Historical Provider, MD  escitalopram  (LEXAPRO) 10 MG tablet Take 10 mg by mouth daily.    Historical Provider, MD  furosemide (LASIX) 10 MG/ML injection Inject 2 mLs (20 mg total) into the vein daily. 09/11/14   Oval Linsey, MD  furosemide (LASIX) 20 MG tablet Take 1 tablet (20 mg total) by mouth daily. 09/11/14   Oval Linsey, MD  guaiFENesin (MUCINEX) 600 MG 12 hr tablet Take 600 mg by mouth 2 (two) times daily.    Historical Provider, MD  levETIRAcetam (KEPPRA) 250 MG tablet Take 250 mg by mouth 2 (two) times daily.      Historical Provider, MD  loratadine (CLARITIN) 10 MG tablet Take 10 mg by mouth daily.    Historical Provider, MD  LORazepam (ATIVAN) 0.5 MG tablet Take 1 tablet (0.5 mg total) by mouth at bedtime. 09/11/14   Oval Linsey, MD  magnesium oxide (MAG-OX) 400 MG tablet Take 400 mg by mouth 2 (two) times daily.    Historical Provider, MD  Memantine HCl ER (NAMENDA XR) 28 MG CP24 Take 28 mg by mouth daily.    Historical Provider, MD  omeprazole (PRILOSEC) 20 MG capsule Take 20 mg by mouth daily.    Historical Provider, MD  potassium chloride SA (K-DUR,KLOR-CON) 20 MEQ tablet Take 20 mEq by mouth daily.    Historical Provider, MD  predniSONE (DELTASONE) 20 MG tablet Take 1 tablet (20 mg total) by mouth daily with breakfast. 09/11/14   Oval Linsey, MD  rivastigmine (EXELON) 4.5 MG capsule Take 4.5 mg by mouth daily.    Historical Provider, MD  simethicone (MYLICON) 80 MG chewable tablet Chew 80 mg by mouth 3 (three) times daily.    Historical Provider, MD  traMADol (ULTRAM) 50 MG tablet Take 50 mg by mouth 4 (four) times daily as needed for moderate pain.    Historical Provider, MD   BP 126/75 mmHg  Pulse 93  Temp(Src) 98.6 F (37 C) (Oral)  Resp 22  SpO2 96% Physical Exam  Constitutional: She is oriented to person, place, and time. She appears well-developed and well-nourished. No distress.  HENT:  Head: Normocephalic and atraumatic.  No facial droop C-collar in place  Eyes: Conjunctivae and EOM are  normal. Pupils are equal, round, and reactive to light.  Neck: Neck supple. No tracheal deviation present.  TTP of the paraspinal midline and cervical region  Cardiovascular: Normal rate.   Pulmonary/Chest: Effort normal. No respiratory distress.  Abdominal: Soft.  Musculoskeletal: Normal range of motion. She exhibits tenderness.  Tenderness right parietal without any significant hematoma.  Gross strength and sensation intact in arms No focal tenderness Externally rotated R leg but no tenderness or swelling to the knee.  Grossly normal strength in legs.  Midline Lumbar tenderness but no thoracic tenderness.   Neurological: She is alert and oriented to person, place, and  time.  Skin: Skin is warm and dry.  Psychiatric: She has a normal mood and affect. Her behavior is normal.  Nursing note and vitals reviewed.   ED Course  Procedures (including critical care time) DIAGNOSTIC STUDIES: Oxygen Saturation is 96% on , normal by my interpretation.    COORDINATION OF CARE: 9:04 PM- Pt advised of plan for treatment and pt agrees.    Labs Review Labs Reviewed  CBC WITH DIFFERENTIAL/PLATELET - Abnormal; Notable for the following:    WBC 12.6 (*)    MCH 25.7 (*)    RDW 15.9 (*)    Neutro Abs 11.1 (*)    All other components within normal limits  BASIC METABOLIC PANEL - Abnormal; Notable for the following:    Glucose, Bld 144 (*)    BUN 21 (*)    Creatinine, Ser 1.37 (*)    Calcium 8.5 (*)    GFR calc non Af Amer 34 (*)    GFR calc Af Amer 39 (*)    All other components within normal limits    Imaging Review Dg Lumbar Spine Complete  09/23/2014   CLINICAL DATA:  Patient states she slipped and fell tonight, striking back of head against wall. Patient complaining of pain to back of head, neck, and upper back/thoracic spine. Patient states lower mid back Sharp constant pain and right side hip pain that radiates down log to right knee.  EXAM: LUMBAR SPINE - COMPLETE 4+ VIEW   COMPARISON:  None.  FINDINGS: No fracture. No spondylolisthesis. Mild loss of disc height at L3-L4-L4-L5. Mild levoscoliosis, apex at L3-L4.  Bones are extensively demineralized.  Dense calcifications are noted along the aorta and its branch vessels. Questionable aortic aneurysm.  Soft tissues otherwise unremarkable.  IMPRESSION: 1. No fracture or acute finding.   Electronically Signed   By: Amie Portland M.D.   On: 09/23/2014 21:54   Ct Head Wo Contrast  09/23/2014   CLINICAL DATA:  Patient fell tonight striking the back of her head. Patient complains of pain to the back of her head, neck and upper back.  EXAM: CT HEAD WITHOUT CONTRAST  CT CERVICAL SPINE WITHOUT CONTRAST  TECHNIQUE: Multidetector CT imaging of the head and cervical spine was performed following the standard protocol without intravenous contrast. Multiplanar CT image reconstructions of the cervical spine were also generated.  COMPARISON:  None.  FINDINGS: CT HEAD FINDINGS  No mass effect or midline shift. No evidence of acute intracranial hemorrhage, or infarction. No abnormal extra-axial fluid collections. Gray-white matter differentiation is normal. Basal cisterns are preserved. There is atrophy and chronic small vessel disease changes within the periventricular white matter.  No depressed skull fractures. Visualized paranasal sinuses and mastoid air cells are not opacified.  CT CERVICAL SPINE FINDINGS  There is no evidence for acute fracture or dislocation. Severe multilevel osteoarthritic changes of the cervical spine are seen, with reversal of the expected cervical lordosis. There is disc space loss, endplate sclerosis and erosions, remodeling of the vertebral bodies and anterior and posterior osteophyte formation. Similarly, there are multilevel osteoarthritic changes of the posterior elements with degenerative ankylosis of C3-C4 articular facets bilaterally. Prevertebral soft tissues have a normal appearance.  Lung apices have a normal  appearance.  IMPRESSION: No acute intracranial abnormality.  Atrophy, chronic microvascular disease.  No evidence of cervical spine fracture or traumatic subluxation.  Severe multilevel osteoarthritic changes of the cervical spine.   Electronically Signed   By: Ted Mcalpine M.D.   On: 09/23/2014 22:26  Ct Cervical Spine Wo Contrast  09/23/2014   CLINICAL DATA:  Patient fell tonight striking the back of her head. Patient complains of pain to the back of her head, neck and upper back.  EXAM: CT HEAD WITHOUT CONTRAST  CT CERVICAL SPINE WITHOUT CONTRAST  TECHNIQUE: Multidetector CT imaging of the head and cervical spine was performed following the standard protocol without intravenous contrast. Multiplanar CT image reconstructions of the cervical spine were also generated.  COMPARISON:  None.  FINDINGS: CT HEAD FINDINGS  No mass effect or midline shift. No evidence of acute intracranial hemorrhage, or infarction. No abnormal extra-axial fluid collections. Gray-white matter differentiation is normal. Basal cisterns are preserved. There is atrophy and chronic small vessel disease changes within the periventricular white matter.  No depressed skull fractures. Visualized paranasal sinuses and mastoid air cells are not opacified.  CT CERVICAL SPINE FINDINGS  There is no evidence for acute fracture or dislocation. Severe multilevel osteoarthritic changes of the cervical spine are seen, with reversal of the expected cervical lordosis. There is disc space loss, endplate sclerosis and erosions, remodeling of the vertebral bodies and anterior and posterior osteophyte formation. Similarly, there are multilevel osteoarthritic changes of the posterior elements with degenerative ankylosis of C3-C4 articular facets bilaterally. Prevertebral soft tissues have a normal appearance.  Lung apices have a normal appearance.  IMPRESSION: No acute intracranial abnormality.  Atrophy, chronic microvascular disease.  No evidence of  cervical spine fracture or traumatic subluxation.  Severe multilevel osteoarthritic changes of the cervical spine.   Electronically Signed   By: Ted Mcalpine M.D.   On: 09/23/2014 22:26   Dg Hip Unilat With Pelvis 2-3 Views Right  09/23/2014   CLINICAL DATA:  Patient states she slipped and fell tonight, striking back of head against wall. Patient complaining of pain to back of head, neck, and upper back/thoracic spine.  Patient states lower mid back Sharp constant pain and right side hip pain that radiates down log to right knee.  EXAM: DG HIP (WITH OR WITHOUT PELVIS) 2-3V RIGHT  COMPARISON:  None.  FINDINGS: No fracture. No bone lesion. Hip joint is normally spaced and aligned with no significant arthropathic change. Bones are diffusely demineralized. There are iliac and common femoral artery vascular calcifications.  IMPRESSION: No fracture or dislocation.  No acute finding.   Electronically Signed   By: Amie Portland M.D.   On: 09/23/2014 21:56   Blane Ohara, MD has personally reviewed and evaluated these images and lab results as part of my medical decision-making.   EKG Interpretation None      MDM   Final diagnoses:  Fall, initial encounter  Acute head injury, initial encounter   Patient with low risk fall presents with head pain and neck pain with mild lower back pain.  X-rays reviewed by myself no acute fracture seen. CT results reviewed no acute findings. Blood work patient baseline. No fever vitals unremarkable, patient stable for outpatient follow-up.  Results and differential diagnosis were discussed with the patient/parent/guardian. Xrays were independently reviewed by myself.  Close follow up outpatient was discussed, comfortable with the plan.   Medications  HYDROcodone-acetaminophen (NORCO/VICODIN) 5-325 MG per tablet 1 tablet (1 tablet Oral Given 09/23/14 2229)    Filed Vitals:   09/23/14 2022  BP: 126/75  Pulse: 93  Temp: 98.6 F (37 C)  TempSrc: Oral   Resp: 22  SpO2: 96%    Final diagnoses:  Fall, initial encounter  Acute head injury, initial encounter  Blane Ohara, MD 09/23/14 [phone]

## 2014-09-23 NOTE — ED Notes (Signed)
Patient states she slipped and fell tonight, striking back of head against wall. Patient complaining of pain to back of head, neck, and upper back/thoracic spine. Patient asking "where am I during triage" and vomiting small amount of emesis. Patient also complains of right leg pain.

## 2014-09-23 NOTE — Discharge Instructions (Signed)
If you were given medicines take as directed.  If you are on coumadin or contraceptives realize their levels and effectiveness is altered by many different medicines.  If you have any reaction (rash, tongues swelling, other) to the medicines stop taking and see a physician.    If your blood pressure was elevated in the ER make sure you follow up for management with a primary doctor or return for chest pain, shortness of breath or stroke symptoms.  Please follow up as directed and return to the ER or see a physician for new or worsening symptoms.  Thank you. Filed Vitals:   09/23/14 2022  BP: 126/75  Pulse: 93  Temp: 98.6 F (37 C)  TempSrc: Oral  Resp: 22  SpO2: 96%

## 2014-10-13 ENCOUNTER — Emergency Department (HOSPITAL_COMMUNITY)
Admission: EM | Admit: 2014-10-13 | Discharge: 2014-10-13 | Disposition: A | Discharge status: Home / Self Care | Payer: Medicare Other | Attending: Emergency Medicine | Admitting: Emergency Medicine

## 2014-10-13 ENCOUNTER — Emergency Department (HOSPITAL_COMMUNITY): Payer: Medicare Other

## 2014-10-13 ENCOUNTER — Encounter (HOSPITAL_COMMUNITY): Payer: Self-pay

## 2014-10-13 DIAGNOSIS — R531 Weakness: Secondary | ICD-10-CM

## 2014-10-13 DIAGNOSIS — M109 Gout, unspecified: Secondary | ICD-10-CM | POA: Diagnosis not present

## 2014-10-13 DIAGNOSIS — Z87891 Personal history of nicotine dependence: Secondary | ICD-10-CM | POA: Insufficient documentation

## 2014-10-13 DIAGNOSIS — I10 Essential (primary) hypertension: Secondary | ICD-10-CM | POA: Insufficient documentation

## 2014-10-13 DIAGNOSIS — I509 Heart failure, unspecified: Secondary | ICD-10-CM | POA: Insufficient documentation

## 2014-10-13 DIAGNOSIS — M6281 Muscle weakness (generalized): Secondary | ICD-10-CM | POA: Diagnosis not present

## 2014-10-13 DIAGNOSIS — Z79899 Other long term (current) drug therapy: Secondary | ICD-10-CM | POA: Insufficient documentation

## 2014-10-13 DIAGNOSIS — F329 Major depressive disorder, single episode, unspecified: Secondary | ICD-10-CM | POA: Diagnosis not present

## 2014-10-13 DIAGNOSIS — F410 Panic disorder [episodic paroxysmal anxiety] without agoraphobia: Secondary | ICD-10-CM | POA: Diagnosis not present

## 2014-10-13 DIAGNOSIS — Z7952 Long term (current) use of systemic steroids: Secondary | ICD-10-CM | POA: Insufficient documentation

## 2014-10-13 DIAGNOSIS — R410 Disorientation, unspecified: Secondary | ICD-10-CM | POA: Insufficient documentation

## 2014-10-13 DIAGNOSIS — M1990 Unspecified osteoarthritis, unspecified site: Secondary | ICD-10-CM | POA: Insufficient documentation

## 2014-10-13 DIAGNOSIS — N390 Urinary tract infection, site not specified: Secondary | ICD-10-CM | POA: Diagnosis not present

## 2014-10-13 DIAGNOSIS — E119 Type 2 diabetes mellitus without complications: Secondary | ICD-10-CM | POA: Insufficient documentation

## 2014-10-13 DIAGNOSIS — F0390 Unspecified dementia without behavioral disturbance: Secondary | ICD-10-CM | POA: Insufficient documentation

## 2014-10-13 DIAGNOSIS — Z87448 Personal history of other diseases of urinary system: Secondary | ICD-10-CM | POA: Insufficient documentation

## 2014-10-13 LAB — COMPREHENSIVE METABOLIC PANEL
ALBUMIN: 3.3 g/dL — AB (ref 3.5–5.0)
ALT: 16 U/L (ref 14–54)
AST: 17 U/L (ref 15–41)
Alkaline Phosphatase: 143 U/L — ABNORMAL HIGH (ref 38–126)
Anion gap: 8 (ref 5–15)
BILIRUBIN TOTAL: 0.6 mg/dL (ref 0.3–1.2)
BUN: 17 mg/dL (ref 6–20)
CO2: 28 mmol/L (ref 22–32)
CREATININE: 1.35 mg/dL — AB (ref 0.44–1.00)
Calcium: 8.4 mg/dL — ABNORMAL LOW (ref 8.9–10.3)
Chloride: 102 mmol/L (ref 101–111)
GFR calc Af Amer: 40 mL/min — ABNORMAL LOW (ref >60)
GFR, EST NON AFRICAN AMERICAN: 34 mL/min — AB (ref >60)
GLUCOSE: 97 mg/dL (ref 65–99)
POTASSIUM: 3.6 mmol/L (ref 3.5–5.1)
Sodium: 138 mmol/L (ref 135–145)
TOTAL PROTEIN: 6.7 g/dL (ref 6.5–8.1)

## 2014-10-13 LAB — URINALYSIS, ROUTINE W REFLEX MICROSCOPIC
BILIRUBIN URINE: NEGATIVE
GLUCOSE, UA: NEGATIVE mg/dL
KETONES UR: NEGATIVE mg/dL
Nitrite: NEGATIVE
PH: 6.5 (ref 5.0–8.0)
Specific Gravity, Urine: 1.01 (ref 1.005–1.030)
Urobilinogen, UA: 0.2 mg/dL (ref 0.0–1.0)

## 2014-10-13 LAB — URINE MICROSCOPIC-ADD ON

## 2014-10-13 LAB — CBC WITH DIFFERENTIAL/PLATELET
BASOS ABS: 0.1 10*3/uL (ref 0.0–0.1)
Basophils Relative: 1 %
EOS ABS: 0.1 10*3/uL (ref 0.0–0.7)
EOS PCT: 2 %
HCT: 40.9 % (ref 36.0–46.0)
Hemoglobin: 13 g/dL (ref 12.0–15.0)
LYMPHS ABS: 1.6 10*3/uL (ref 0.7–4.0)
LYMPHS PCT: 25 %
MCH: 26 pg (ref 26.0–34.0)
MCHC: 31.8 g/dL (ref 30.0–36.0)
MCV: 81.8 fL (ref 78.0–100.0)
MONO ABS: 0.6 10*3/uL (ref 0.1–1.0)
Monocytes Relative: 9 %
Neutro Abs: 4.2 10*3/uL (ref 1.7–7.7)
Neutrophils Relative %: 63 %
PLATELETS: 226 10*3/uL (ref 150–400)
RBC: 5 MIL/uL (ref 3.87–5.11)
RDW: 16.5 % — AB (ref 11.5–15.5)
WBC: 6.6 10*3/uL (ref 4.0–10.5)

## 2014-10-13 LAB — BRAIN NATRIURETIC PEPTIDE: B Natriuretic Peptide: 323 pg/mL — ABNORMAL HIGH (ref 0.0–100.0)

## 2014-10-13 MED ORDER — CEPHALEXIN 500 MG PO CAPS: 500.0000 mg | ORAL_CAPSULE | Freq: Two times a day (BID) | ORAL | Status: DC

## 2014-10-13 NOTE — ED Provider Notes (Signed)
CSN: [id number]     Arrival date & time 10/13/14  1233 History   First MD Initiated Contact with Patient 10/13/14 1234     Chief Complaint  Patient presents with  . Weakness     (Consider location/radiation/quality/duration/timing/severity/associated sxs/prior Treatment) HPI  81 year old female with a history of dementia, COPD, and CHF presents with progressive weakness for at least one week. The history taken from EMS. They state the patient got out of the hospital one week ago and has had a progressive strength decline since. Patient has had falls and this morning had to be helped down to the ground because she was so weak. The weakness is diffuse. The patient's only complaint is that she urinates a lot from her diuretics. She denies dysuria. No headaches. There is no altered mental status or fevers. The patient denies any other complaints and has no pain.  After arrival the NH was contacted and states she's been getting weaker with increased falls x 2 weeks. Fell this morning, no apparent injuries. Complained of transient dyspnea today, none now. Had to be helped up for weakness earlier today.  Past Medical History  Diagnosis Date  . COPD (chronic obstructive pulmonary disease) (HCC)   . CHF (congestive heart failure) (HCC)   . Delirium   . Metabolic alkalosis   . Hypertension   . Diabetes mellitus   . Anxiety   . Depression   . Panic attacks   . Chronic diarrhea of unknown origin   . Gout   . Arthritis   . Dementia   . Renal disorder    Past Surgical History  Procedure Laterality Date  . Inguinal hernia repair      right   No family history on file. Social History  Substance Use Topics  . Smoking status: Former Games developer  . Smokeless tobacco: None  . Alcohol Use: No   OB History    No data available     Review of Systems  Unable to perform ROS: Dementia      Allergies  Shellfish allergy  Home Medications   Prior to Admission medications   Medication Sig  Start Date End Date Taking? Authorizing Provider  alum & mag hydroxide-simeth (MAALOX/MYLANTA) 200-200-20 MG/5ML suspension Take 30 mLs by mouth every 4 (four) hours as needed for indigestion or heartburn. For indigestion    Historical Provider, MD  amLODipine (NORVASC) 10 MG tablet Take 10 mg by mouth daily.      Historical Provider, MD  benzonatate (TESSALON) 200 MG capsule Take 200 mg by mouth 3 (three) times daily as needed for cough.     Historical Provider, MD  carvedilol (COREG) 12.5 MG tablet Take 12.5 mg by mouth 2 (two) times daily.    Historical Provider, MD  escitalopram (LEXAPRO) 10 MG tablet Take 10 mg by mouth daily.    Historical Provider, MD  furosemide (LASIX) 10 MG/ML injection Inject 2 mLs (20 mg total) into the vein daily. 09/11/14   Oval Linsey, MD  furosemide (LASIX) 20 MG tablet Take 1 tablet (20 mg total) by mouth daily. 09/11/14   Oval Linsey, MD  guaiFENesin (MUCINEX) 600 MG 12 hr tablet Take 600 mg by mouth 2 (two) times daily.    Historical Provider, MD  levETIRAcetam (KEPPRA) 250 MG tablet Take 250 mg by mouth 2 (two) times daily.      Historical Provider, MD  loratadine (CLARITIN) 10 MG tablet Take 10 mg by mouth daily.    Historical Provider, MD  LORazepam (  ATIVAN) 0.5 MG tablet Take 1 tablet (0.5 mg total) by mouth at bedtime. 09/11/14   Oval Linsey, MD  magnesium oxide (MAG-OX) 400 MG tablet Take 400 mg by mouth 2 (two) times daily.    Historical Provider, MD  Memantine HCl ER (NAMENDA XR) 28 MG CP24 Take 28 mg by mouth daily.    Historical Provider, MD  omeprazole (PRILOSEC) 20 MG capsule Take 20 mg by mouth daily.    Historical Provider, MD  potassium chloride SA (K-DUR,KLOR-CON) 20 MEQ tablet Take 20 mEq by mouth daily.    Historical Provider, MD  predniSONE (DELTASONE) 20 MG tablet Take 1 tablet (20 mg total) by mouth daily with breakfast. 09/11/14   Oval Linsey, MD  rivastigmine (EXELON) 4.5 MG capsule Take 4.5 mg by mouth daily.    Historical  Provider, MD  simethicone (MYLICON) 80 MG chewable tablet Chew 80 mg by mouth 3 (three) times daily.    Historical Provider, MD  traMADol (ULTRAM) 50 MG tablet Take 50 mg by mouth 4 (four) times daily as needed for moderate pain.    Historical Provider, MD   BP 119/61 mmHg  Pulse 86  Temp(Src) 98 F (36.7 C) (Oral)  Resp 17  Ht  (1.626 m)  Wt 170 lb (77.111 kg)  BMI 29.17 kg/m2  SpO2 91% Physical Exam  Constitutional: She appears well-developed and well-nourished.  HENT:  Head: Normocephalic and atraumatic.  Right Ear: External ear normal.  Left Ear: External ear normal.  Nose: Nose normal.  Eyes: Right eye exhibits no discharge. Left eye exhibits no discharge.  Cardiovascular: Normal rate, regular rhythm and normal heart sounds.   Pulmonary/Chest: Effort normal and breath sounds normal.  Abdominal: Soft. She exhibits no distension. There is no tenderness.  Musculoskeletal: She exhibits no edema.  Neurological: She is alert. She is disoriented.  Alert to self and place. Pleasantly confused and disoriented to time. CN 2-12 grossly intact. 5/5 strength in all 4 extremities.   Skin: Skin is warm and dry.  Nursing note and vitals reviewed.   ED Course  Procedures (including critical care time) Labs Review Labs Reviewed  COMPREHENSIVE METABOLIC PANEL - Abnormal; Notable for the following:    Creatinine, Ser 1.35 (*)    Calcium 8.4 (*)    Albumin 3.3 (*)    Alkaline Phosphatase 143 (*)    GFR calc non Af Amer 34 (*)    GFR calc Af Amer 40 (*)    All other components within normal limits  CBC WITH DIFFERENTIAL/PLATELET - Abnormal; Notable for the following:    RDW 16.5 (*)    All other components within normal limits  URINALYSIS, ROUTINE W REFLEX MICROSCOPIC (NOT AT [facility]) - Abnormal; Notable for the following:    APPearance CLOUDY (*)    Hgb urine dipstick TRACE (*)    Protein, ur TRACE (*)    Leukocytes, UA SMALL (*)    All other components within normal limits   BRAIN NATRIURETIC PEPTIDE - Abnormal; Notable for the following:    B Natriuretic Peptide 323.0 (*)    All other components within normal limits  URINE MICROSCOPIC-ADD ON - Abnormal; Notable for the following:    Squamous Epithelial / LPF FEW (*)    Bacteria, UA MANY (*)    All other components within normal limits  URINE CULTURE    Imaging Review Dg Chest 2 View  10/13/2014   CLINICAL DATA:  Weakness for 1 month  EXAM: CHEST  2 VIEW  COMPARISON:  09/05/2014  FINDINGS: Mild cardiomegaly. Right basilar atelectasis. Chronic left basilar consolidation. No pneumothorax. Osteopenia. Chronic deformity of the manubrium.  IMPRESSION: No active cardiopulmonary disease.   Electronically Signed   By: Jolaine Click M.D.   On: 10/13/2014 13:30   Ct Head Wo Contrast  10/13/2014   CLINICAL DATA:  Multiple falls over the last week. Weakness. History of dementia.  EXAM: CT HEAD WITHOUT CONTRAST  TECHNIQUE: Contiguous axial images were obtained from the base of the skull through the vertex without intravenous contrast.  COMPARISON:  09/23/2014  FINDINGS: No change. The brain shows generalized atrophy. There chronic small-vessel ischemic changes affecting the cerebral hemispheric white matter. No sign of acute infarction, mass lesion, hemorrhage, hydrocephalus or extra-axial collection. The calvarium is unremarkable. There is mucosal thickening affecting the left sphenoid sinus. No fluid in the middle ears or mastoids. There is atherosclerotic calcification of the major vessels at the base of the brain.  IMPRESSION: No acute finding. Atrophy and chronic small-vessel ischemic changes of the white matter.   Electronically Signed   By: Paulina Fusi M.D.   On: 10/13/2014 14:32   I have personally reviewed and evaluated these images and lab results as part of my medical decision-making.   EKG Interpretation   Date/Time:  Friday October 13 2014 12:35:28 EDT Ventricular Rate:  84 PR Interval:  165 QRS Duration:  89 QT Interval:  422 QTC Calculation: 499 R Axis:   -20 Text Interpretation:  Sinus rhythm Borderline left axis deviation Abnormal  R-wave progression, early transition Abnormal inferior Q waves Borderline  prolonged QT interval No significant change since last tracing Confirmed  by Kryssa Risenhoover  MD, Carrie Schoonmaker ([id number]) on 10/13/2014 12:46:47 PM      MDM   Final diagnoses:  Generalized weakness  UTI (lower urinary tract infection)    Patient with diffuse generalized weakness and increase in falls over the last couple weeks. No significant injuries at this time. Labwork unremarkable besides possible UTI. Given her urinary frequency I will send for culture and treat as a UTI although this could all be due to diaphoretic. No focal neuro abnormalities. No indication for admission, was sent back to nursing home for follow-up with PCP and possible medication adjustment as she is on multiple medicines that could cause her to have weakness or falls.    Pricilla Loveless, MD 10/13/14 1515

## 2014-10-13 NOTE — ED Notes (Signed)
Noted to have area of nonblanchable erythema to right heel and areas of brusing to right buttocks

## 2014-10-13 NOTE — ED Notes (Signed)
Brought in by EMS. Pt discharged from hospital one week ago with dx CHF. Pt has been in [place] for one week. Has has had falls and reports weakness. This am was placed on toilet and staff had to lowered to floor. Wears O2 at HS sats 92 %. Denies SOB and CP. No edema

## 2014-10-15 LAB — URINE CULTURE: Culture: >=100000

## 2014-10-16 ENCOUNTER — Telehealth (HOSPITAL_BASED_OUTPATIENT_CLINIC_OR_DEPARTMENT_OTHER): Payer: Self-pay | Admitting: Emergency Medicine

## 2014-10-16 NOTE — Telephone Encounter (Signed)
Post ED Visit - Positive Culture Follow-up  Culture report reviewed by antimicrobial stewardship pharmacist:   Celedonio Miyamoto, Pharm.D., BCPS  Georgina Pillion, [address].D., BCPS  [place], [address].D., BCPS, AAHIVP  Estella Husk, Pharm.D., BCPS, AAHIVP  Madeline, [address].D.  Tennis Must, Pharm.D.  Positive urine culture E.coli Treated with cephalexin, organism sensitive to the same and no further patient follow-up is required at this time.  Berle Mull 10/16/2014, 9:25 AM

## 2014-10-18 ENCOUNTER — Emergency Department (HOSPITAL_COMMUNITY)
Admission: EM | Admit: 2014-10-18 | Discharge: 2014-10-18 | Disposition: A | Discharge status: Home / Self Care | Payer: Medicare Other | Attending: Emergency Medicine | Admitting: Emergency Medicine

## 2014-10-18 ENCOUNTER — Encounter (HOSPITAL_COMMUNITY): Payer: Self-pay | Admitting: *Deleted

## 2014-10-18 DIAGNOSIS — Z87448 Personal history of other diseases of urinary system: Secondary | ICD-10-CM | POA: Insufficient documentation

## 2014-10-18 DIAGNOSIS — G4700 Insomnia, unspecified: Secondary | ICD-10-CM | POA: Insufficient documentation

## 2014-10-18 DIAGNOSIS — F419 Anxiety disorder, unspecified: Secondary | ICD-10-CM

## 2014-10-18 DIAGNOSIS — M1990 Unspecified osteoarthritis, unspecified site: Secondary | ICD-10-CM | POA: Insufficient documentation

## 2014-10-18 DIAGNOSIS — Z792 Long term (current) use of antibiotics: Secondary | ICD-10-CM | POA: Insufficient documentation

## 2014-10-18 DIAGNOSIS — Z8719 Personal history of other diseases of the digestive system: Secondary | ICD-10-CM | POA: Diagnosis not present

## 2014-10-18 DIAGNOSIS — E119 Type 2 diabetes mellitus without complications: Secondary | ICD-10-CM | POA: Diagnosis not present

## 2014-10-18 DIAGNOSIS — F329 Major depressive disorder, single episode, unspecified: Secondary | ICD-10-CM | POA: Diagnosis not present

## 2014-10-18 DIAGNOSIS — F410 Panic disorder [episodic paroxysmal anxiety] without agoraphobia: Secondary | ICD-10-CM | POA: Insufficient documentation

## 2014-10-18 DIAGNOSIS — Z87440 Personal history of urinary (tract) infections: Secondary | ICD-10-CM | POA: Insufficient documentation

## 2014-10-18 DIAGNOSIS — J441 Chronic obstructive pulmonary disease with (acute) exacerbation: Secondary | ICD-10-CM | POA: Diagnosis not present

## 2014-10-18 DIAGNOSIS — Z87891 Personal history of nicotine dependence: Secondary | ICD-10-CM | POA: Insufficient documentation

## 2014-10-18 DIAGNOSIS — Z79899 Other long term (current) drug therapy: Secondary | ICD-10-CM | POA: Insufficient documentation

## 2014-10-18 DIAGNOSIS — Z9181 History of falling: Secondary | ICD-10-CM | POA: Insufficient documentation

## 2014-10-18 DIAGNOSIS — F0390 Unspecified dementia without behavioral disturbance: Secondary | ICD-10-CM | POA: Insufficient documentation

## 2014-10-18 DIAGNOSIS — M6281 Muscle weakness (generalized): Secondary | ICD-10-CM | POA: Insufficient documentation

## 2014-10-18 DIAGNOSIS — M109 Gout, unspecified: Secondary | ICD-10-CM | POA: Diagnosis not present

## 2014-10-18 DIAGNOSIS — I10 Essential (primary) hypertension: Secondary | ICD-10-CM | POA: Diagnosis not present

## 2014-10-18 DIAGNOSIS — R531 Weakness: Secondary | ICD-10-CM

## 2014-10-18 DIAGNOSIS — I509 Heart failure, unspecified: Secondary | ICD-10-CM | POA: Insufficient documentation

## 2014-10-18 LAB — URINALYSIS, ROUTINE W REFLEX MICROSCOPIC
BILIRUBIN URINE: NEGATIVE
GLUCOSE, UA: NEGATIVE mg/dL
Ketones, ur: NEGATIVE mg/dL
Nitrite: NEGATIVE
PROTEIN: 30 mg/dL — AB
Specific Gravity, Urine: 1.02 (ref 1.005–1.030)
Urobilinogen, UA: 0.2 mg/dL (ref 0.0–1.0)
pH: 6 (ref 5.0–8.0)

## 2014-10-18 LAB — URINE MICROSCOPIC-ADD ON

## 2014-10-18 NOTE — Discharge Instructions (Signed)
You were seen today for anxiety. You need to follow-up with her primary physician for further evaluation. You also has been noted to have general decline over the last several weeks. Patient needs formal physical therapy evaluation.  Generalized Anxiety Disorder Generalized anxiety disorder (GAD) is a mental disorder. It interferes with life functions, including relationships, work, and school. GAD is different from normal anxiety, which everyone experiences at some point in their lives in response to specific life events and activities. Normal anxiety actually helps us prepare for and get through these life events and activities. Normal anxiety goes away after the event or activity is over.  GAD causes anxiety that is not necessarily related to specific events or activities. It also causes excess anxiety in proportion to specific events or activities. The anxiety associated with GAD is also difficult to control. GAD can vary from mild to severe. People with severe GAD can have intense waves of anxiety with physical symptoms (panic attacks).  SYMPTOMS The anxiety and worry associated with GAD are difficult to control. This anxiety and worry are related to many life events and activities and also occur more days than not for 6 months or longer. People with GAD also have three or more of the following symptoms (one or more in children):  Restlessness.   Fatigue.  Difficulty concentrating.   Irritability.  Muscle tension.  Difficulty sleeping or unsatisfying sleep. DIAGNOSIS GAD is diagnosed through an assessment by your health care provider. Your health care provider will ask you questions aboutyour mood,physical symptoms, and events in your life. Your health care provider may ask you about your medical history and use of alcohol or drugs, including prescription medicines. Your health care provider may also do a physical exam and blood tests. Certain medical conditions and the use of certain  substances can cause symptoms similar to those associated with GAD. Your health care provider may refer you to a mental health specialist for further evaluation. TREATMENT The following therapies are usually used to treat GAD:   Medication. Antidepressant medication usually is prescribed for long-term daily control. Antianxiety medicines may be added in severe cases, especially when panic attacks occur.   Talk therapy (psychotherapy). Certain types of talk therapy can be helpful in treating GAD by providing support, education, and guidance. A form of talk therapy called cognitive behavioral therapy can teach you healthy ways to think about and react to daily life events and activities.  Stress managementtechniques. These include yoga, meditation, and exercise and can be very helpful when they are practiced regularly. A mental health specialist can help determine which treatment is best for you. Some people see improvement with one therapy. However, other people require a combination of therapies.   This information is not intended to replace advice given to you by your health care provider. Make sure you discuss any questions you have with your health care provider.   Document Released: 04/19/2012 Document Revised: 01/13/2014 Document Reviewed: 04/19/2012 Elsevier Interactive Patient Education [employer]2016 [employer] Inc[employer]

## 2014-10-18 NOTE — ED Provider Notes (Addendum)
CSN: [id number]     Arrival date & time 10/18/14  0240 History   First MD Initiated Contact with Patient 10/18/14 0249     Chief Complaint  Patient presents with  . Anxiety     (Consider location/radiation/quality/duration/timing/severity/associated sxs/prior Treatment) HPI  This is an 81 year old female with a history of COPD, CHF, delirium, diabetes who presents with anxiety. Patient reports that she did not sleep at all yesterday or tonight. She reports a 4-5 week history of worsening anxiety. Most of her anxiety is related to feeling like she will fall. She was seen and evaluated for a fall in mid September. Her workup is reassuring. She was also seen approximate 5 days ago for a urinary tract infection. She denies any physical complaints including fever, chest pain, shortness breath, abdominal pain, urinary symptoms, dizziness, focal weakness or numbness. She currently uses a wheelchair out of fear for falling. She lives in [facility]. Prescribe Xanax but states that this makes her feel worse. Currently she states that she does not feel anxious because she feels safe.  She denies feeling unsafe at her living facility. She denies being harmed by anyone.  Unable to get in contact with nursing facility. It appears since patient's most recent admission in early September, she has had general decline and weakness with increasing falls. She is now using a wheelchair exclusively.  Past Medical History  Diagnosis Date  . COPD (chronic obstructive pulmonary disease) (HCC)   . CHF (congestive heart failure) (HCC)   . Delirium   . Metabolic alkalosis   . Hypertension   . Diabetes mellitus   . Anxiety   . Depression   . Panic attacks   . Chronic diarrhea of unknown origin   . Gout   . Arthritis   . Dementia   . Renal disorder    Past Surgical History  Procedure Laterality Date  . Inguinal hernia repair      right   History reviewed. No pertinent family history. Social  History  Substance Use Topics  . Smoking status: Former Games developermoker  . Smokeless tobacco: None  . Alcohol Use: No   OB History    No data available     Review of Systems  Constitutional: Negative for fever.       Insomnia  Respiratory: Negative for cough, chest tightness and shortness of breath.   Cardiovascular: Negative for chest pain.  Gastrointestinal: Negative for nausea, vomiting and abdominal pain.  Genitourinary: Negative for dysuria.  Musculoskeletal: Negative for back pain.  Neurological: Negative for weakness, numbness and headaches.  Psychiatric/Behavioral: Negative for confusion. The patient is nervous/anxious.   All other systems reviewed and are negative.     Allergies  Shellfish allergy  Home Medications   Prior to Admission medications   Medication Sig Start Date End Date Taking? Authorizing Provider  ALPRAZolam Prudy Feeler(XANAX) 0.5 MG tablet Take 0.5 mg by mouth 2 (two) times daily as needed for anxiety.    Historical Provider, MD  alum & mag hydroxide-simeth (MAALOX/MYLANTA) 200-200-20 MG/5ML suspension Take 30 mLs by mouth every 4 (four) hours as needed for indigestion or heartburn. For indigestion    Historical Provider, MD  amLODipine (NORVASC) 10 MG tablet Take 10 mg by mouth daily.      Historical Provider, MD  benzonatate (TESSALON) 200 MG capsule Take 200 mg by mouth 3 (three) times daily as needed for cough.     Historical Provider, MD  carvedilol (COREG) 12.5 MG tablet Take 12.5 mg by  mouth 2 (two) times daily.    Historical Provider, MD  cephALEXin (KEFLEX) 500 MG capsule Take 1 capsule (500 mg total) by mouth 2 (two) times daily. 10/13/14   Pricilla Loveless, MD  escitalopram (LEXAPRO) 10 MG tablet Take 10 mg by mouth daily.    Historical Provider, MD  furosemide (LASIX) 10 MG/ML injection Inject 2 mLs (20 mg total) into the vein daily. Patient not taking: Reported on 10/13/2014 09/11/14   Oval Linsey, MD  furosemide (LASIX) 20 MG tablet Take 1 tablet (20 mg  total) by mouth daily. Patient not taking: Reported on 10/13/2014 09/11/14   Oval Linsey, MD  guaiFENesin (MUCINEX) 600 MG 12 hr tablet Take 600 mg by mouth 2 (two) times daily.    Historical Provider, MD  levETIRAcetam (KEPPRA) 250 MG tablet Take 250 mg by mouth 2 (two) times daily.      Historical Provider, MD  loratadine (CLARITIN) 10 MG tablet Take 10 mg by mouth daily.    Historical Provider, MD  LORazepam (ATIVAN) 0.5 MG tablet Take 1 tablet (0.5 mg total) by mouth at bedtime. 09/11/14   Oval Linsey, MD  magnesium oxide (MAG-OX) 400 MG tablet Take 400 mg by mouth 2 (two) times daily.    Historical Provider, MD  Memantine HCl ER (NAMENDA XR) 28 MG CP24 Take 28 mg by mouth daily.    Historical Provider, MD  omeprazole (PRILOSEC) 20 MG capsule Take 20 mg by mouth daily.    Historical Provider, MD  potassium chloride SA (K-DUR,KLOR-CON) 20 MEQ tablet Take 20 mEq by mouth daily.    Historical Provider, MD  predniSONE (DELTASONE) 20 MG tablet Take 1 tablet (20 mg total) by mouth daily with breakfast. Patient not taking: Reported on 10/13/2014 09/11/14   Oval Linsey, MD  rivastigmine (EXELON) 4.5 MG capsule Take 4.5 mg by mouth daily.    Historical Provider, MD  simethicone (MYLICON) 80 MG chewable tablet Chew 80 mg by mouth 3 (three) times daily.    Historical Provider, MD  torsemide (DEMADEX) 100 MG tablet Take 100 mg by mouth daily.    Historical Provider, MD  traMADol (ULTRAM) 50 MG tablet Take 50 mg by mouth 4 (four) times daily as needed for moderate pain.    Historical Provider, MD   BP 130/64 mmHg  Pulse 78  Temp(Src) 98.5 F (36.9 C) (Oral)  Resp 18  SpO2 94% Physical Exam  Constitutional: She is oriented to person, place, and time. No distress.  Elderly, anxious  HENT:  Head: Normocephalic and atraumatic.  Cardiovascular: Normal rate, regular rhythm and normal heart sounds.   Pulmonary/Chest: Effort normal. No respiratory distress. She has wheezes.  Scant wheeze   Abdominal: Soft. Bowel sounds are normal. There is no tenderness. There is no rebound.  Musculoskeletal:  5 out of 5 strength in all 4 extremities, cranial nerves II through XII intact  Neurological: She is alert and oriented to person, place, and time. She has normal reflexes.  Skin: Skin is warm and dry.  Psychiatric: She has a normal mood and affect.  Nursing note and vitals reviewed.   ED Course  Procedures (including critical care time) Labs Review Labs Reviewed  URINALYSIS, ROUTINE W REFLEX MICROSCOPIC (NOT AT [facility]) - Abnormal; Notable for the following:    Hgb urine dipstick TRACE (*)    Protein, ur 30 (*)    Leukocytes, UA SMALL (*)    All other components within normal limits  URINE CULTURE  URINE MICROSCOPIC-ADD ON  Imaging Review No results found. I have personally reviewed and evaluated these images and lab results as part of my medical decision-making.   EKG Interpretation None      MDM   Final diagnoses:  Anxiety  Generalized weakness    Patient presents with anxiety. Reports insomnia over the last day. She denies any physical complaints at this time. Per EMS report, she is exclusively using a wheelchair. Approximate 1 month ago, she was ambulatory. Patient reports that she is "deathly afraid of falling." When attempting to use the bedside commode, patient was able to bear weight on her lower extremities but required assist in Saying "I'm scared of falling." She appears to have normal strength and is noted to move bilateral lower extremities spontaneously without difficulty in bed. She denies any pain. She's had a recent CT head that was reassuring. Recent lab work reassuring. She is afebrile.  With otherwise reassuring neurologic exam, do not feel further imaging or workup is warranted at this time. She is also otherwise asymptomatic. She needs to follow-up closely with her primary physician for medication adjustment. Xanax does not appear to be helping her.  She has rested comfortably here without intervention. This suggests to me that she may have some situational anxiety at her living facility. She denies being harmed.  She also needs physical therapy evaluation given generalized decline.     After history, exam, and medical workup I feel the patient has been appropriately medically screened and is safe for discharge home. Pertinent diagnoses were discussed with the patient. Patient was given return precautions.     Shon Baton, MD 10/18/14 9147  Shon Baton, MD 10/18/14 [phone]

## 2014-10-18 NOTE — ED Notes (Signed)
Pt c/o feeling anxious x 1 month and states the feeling has gotten worse over the last few days; pt has fear of falling; pt used to use a walker to get around but now all she uses is a wheelchair

## 2014-10-18 NOTE — ED Notes (Signed)
[facility] bed with 3 person assistance; pt kept stating she was scared she was going to fall

## 2014-10-18 NOTE — ED Notes (Signed)
Called to inform staff at [facility] that pt is ready for discharge, staff stated  They will not have anyone available until 7am to pick up pt

## 2014-10-19 LAB — URINE CULTURE

## 2014-11-24 ENCOUNTER — Inpatient Hospital Stay (HOSPITAL_COMMUNITY)
Admission: EM | Admit: 2014-11-24 | Discharge: 2014-11-27 | DRG: 291 | Disposition: A | Discharge status: Home Health | Payer: Medicare Other | Attending: Pulmonary Disease | Admitting: Pulmonary Disease

## 2014-11-24 ENCOUNTER — Emergency Department (HOSPITAL_COMMUNITY): Payer: Medicare Other

## 2014-11-24 ENCOUNTER — Encounter (HOSPITAL_COMMUNITY): Payer: Self-pay | Admitting: Emergency Medicine

## 2014-11-24 DIAGNOSIS — E1122 Type 2 diabetes mellitus with diabetic chronic kidney disease: Secondary | ICD-10-CM | POA: Diagnosis present

## 2014-11-24 DIAGNOSIS — G9341 Metabolic encephalopathy: Secondary | ICD-10-CM | POA: Diagnosis present

## 2014-11-24 DIAGNOSIS — R0902 Hypoxemia: Secondary | ICD-10-CM | POA: Diagnosis not present

## 2014-11-24 DIAGNOSIS — F0390 Unspecified dementia without behavioral disturbance: Secondary | ICD-10-CM

## 2014-11-24 DIAGNOSIS — M1990 Unspecified osteoarthritis, unspecified site: Secondary | ICD-10-CM | POA: Diagnosis present

## 2014-11-24 DIAGNOSIS — N390 Urinary tract infection, site not specified: Secondary | ICD-10-CM | POA: Diagnosis present

## 2014-11-24 DIAGNOSIS — I5043 Acute on chronic combined systolic (congestive) and diastolic (congestive) heart failure: Secondary | ICD-10-CM | POA: Diagnosis present

## 2014-11-24 DIAGNOSIS — J449 Chronic obstructive pulmonary disease, unspecified: Secondary | ICD-10-CM | POA: Diagnosis present

## 2014-11-24 DIAGNOSIS — I509 Heart failure, unspecified: Secondary | ICD-10-CM

## 2014-11-24 DIAGNOSIS — E876 Hypokalemia: Secondary | ICD-10-CM | POA: Diagnosis present

## 2014-11-24 DIAGNOSIS — F410 Panic disorder [episodic paroxysmal anxiety] without agoraphobia: Secondary | ICD-10-CM | POA: Diagnosis present

## 2014-11-24 DIAGNOSIS — I130 Hypertensive heart and chronic kidney disease with heart failure and stage 1 through stage 4 chronic kidney disease, or unspecified chronic kidney disease: Secondary | ICD-10-CM | POA: Diagnosis not present

## 2014-11-24 DIAGNOSIS — R4182 Altered mental status, unspecified: Secondary | ICD-10-CM | POA: Diagnosis present

## 2014-11-24 DIAGNOSIS — I5033 Acute on chronic diastolic (congestive) heart failure: Secondary | ICD-10-CM

## 2014-11-24 DIAGNOSIS — I5023 Acute on chronic systolic (congestive) heart failure: Secondary | ICD-10-CM | POA: Diagnosis present

## 2014-11-24 DIAGNOSIS — N189 Chronic kidney disease, unspecified: Secondary | ICD-10-CM | POA: Diagnosis present

## 2014-11-24 DIAGNOSIS — J90 Pleural effusion, not elsewhere classified: Secondary | ICD-10-CM

## 2014-11-24 DIAGNOSIS — I5022 Chronic systolic (congestive) heart failure: Secondary | ICD-10-CM | POA: Diagnosis present

## 2014-11-24 DIAGNOSIS — R410 Disorientation, unspecified: Secondary | ICD-10-CM

## 2014-11-24 DIAGNOSIS — Z87891 Personal history of nicotine dependence: Secondary | ICD-10-CM

## 2014-11-24 LAB — URINE MICROSCOPIC-ADD ON

## 2014-11-24 LAB — CBC WITH DIFFERENTIAL/PLATELET
BASOS ABS: 0.1 10*3/uL (ref 0.0–0.1)
Basophils Relative: 1 %
Eosinophils Absolute: 0.5 10*3/uL (ref 0.0–0.7)
Eosinophils Relative: 6 %
HEMATOCRIT: 40.1 % (ref 36.0–46.0)
Hemoglobin: 12.3 g/dL (ref 12.0–15.0)
LYMPHS ABS: 1.5 10*3/uL (ref 0.7–4.0)
LYMPHS PCT: 18 %
MCH: 24.9 pg — AB (ref 26.0–34.0)
MCHC: 30.7 g/dL (ref 30.0–36.0)
MCV: 81.2 fL (ref 78.0–100.0)
Monocytes Absolute: 0.8 10*3/uL (ref 0.1–1.0)
Monocytes Relative: 9 %
NEUTROS ABS: 5.4 10*3/uL (ref 1.7–7.7)
Neutrophils Relative %: 66 %
PLATELETS: 259 10*3/uL (ref 150–400)
RBC: 4.94 MIL/uL (ref 3.87–5.11)
RDW: 15.6 % — AB (ref 11.5–15.5)
WBC: 8.1 10*3/uL (ref 4.0–10.5)

## 2014-11-24 LAB — URINALYSIS, ROUTINE W REFLEX MICROSCOPIC
Bilirubin Urine: NEGATIVE
GLUCOSE, UA: NEGATIVE mg/dL
Ketones, ur: NEGATIVE mg/dL
Nitrite: POSITIVE — AB
PH: 5.5 (ref 5.0–8.0)
PROTEIN: 100 mg/dL — AB
Specific Gravity, Urine: 1.025 (ref 1.005–1.030)

## 2014-11-24 LAB — COMPREHENSIVE METABOLIC PANEL
ALT: 14 U/L (ref 14–54)
AST: 19 U/L (ref 15–41)
Albumin: 3.6 g/dL (ref 3.5–5.0)
Alkaline Phosphatase: 138 U/L — ABNORMAL HIGH (ref 38–126)
Anion gap: 10 (ref 5–15)
BILIRUBIN TOTAL: 0.6 mg/dL (ref 0.3–1.2)
BUN: 15 mg/dL (ref 6–20)
CHLORIDE: 103 mmol/L (ref 101–111)
CO2: 26 mmol/L (ref 22–32)
CREATININE: 1.39 mg/dL — AB (ref 0.44–1.00)
Calcium: 8.8 mg/dL — ABNORMAL LOW (ref 8.9–10.3)
GFR, EST AFRICAN AMERICAN: 38 mL/min — AB (ref >60)
GFR, EST NON AFRICAN AMERICAN: 33 mL/min — AB (ref >60)
Glucose, Bld: 101 mg/dL — ABNORMAL HIGH (ref 65–99)
Potassium: 3.7 mmol/L (ref 3.5–5.1)
Sodium: 139 mmol/L (ref 135–145)
TOTAL PROTEIN: 7.2 g/dL (ref 6.5–8.1)

## 2014-11-24 LAB — BRAIN NATRIURETIC PEPTIDE: B Natriuretic Peptide: 775 pg/mL — ABNORMAL HIGH (ref 0.0–100.0)

## 2014-11-24 LAB — LACTIC ACID, PLASMA
Lactic Acid, Venous: 0.8 mmol/L (ref 0.5–2.0)
Lactic Acid, Venous: 1.2 mmol/L (ref 0.5–2.0)

## 2014-11-24 LAB — TROPONIN I: TROPONIN I: 0.03 ng/mL (ref <0.031)

## 2014-11-24 MED ORDER — ALPRAZOLAM 0.25 MG PO TABS
0.2500 mg | ORAL_TABLET | Freq: Two times a day (BID) | ORAL | Status: DC | PRN
  Administered 2014-11-25: 0.25 mg via ORAL
  Filled 2014-11-24: qty 1

## 2014-11-24 MED ORDER — LEVETIRACETAM 250 MG PO TABS
250.0000 mg | ORAL_TABLET | Freq: Two times a day (BID) | ORAL | Status: DC
  Administered 2014-11-24 – 2014-11-27 (×6): 250 mg via ORAL
  Filled 2014-11-24 (×6): qty 1

## 2014-11-24 MED ORDER — FUROSEMIDE 10 MG/ML IJ SOLN
40.0000 mg | Freq: Once | INTRAMUSCULAR | Status: AC
  Administered 2014-11-24: 40 mg via INTRAVENOUS
  Filled 2014-11-24: qty 4

## 2014-11-24 MED ORDER — MAGNESIUM OXIDE 400 (241.3 MG) MG PO TABS
400.0000 mg | ORAL_TABLET | Freq: Two times a day (BID) | ORAL | Status: DC
  Administered 2014-11-24 – 2014-11-27 (×6): 400 mg via ORAL
  Filled 2014-11-24 (×6): qty 1

## 2014-11-24 MED ORDER — BENZONATATE 100 MG PO CAPS
200.0000 mg | ORAL_CAPSULE | Freq: Three times a day (TID) | ORAL | Status: DC | PRN
Start: 2014-11-24 — End: 2014-11-27

## 2014-11-24 MED ORDER — ACETAMINOPHEN 325 MG PO TABS: 650.0000 mg | ORAL_TABLET | Freq: Four times a day (QID) | ORAL | Status: DC | PRN

## 2014-11-24 MED ORDER — SODIUM CHLORIDE 0.9 % IJ SOLN: 3.0000 mL | INTRAMUSCULAR | Status: DC | PRN

## 2014-11-24 MED ORDER — MEMANTINE HCL ER 28 MG PO CP24
28.0000 mg | ORAL_CAPSULE | Freq: Every day | ORAL | Status: DC
  Administered 2014-11-25 – 2014-11-27 (×3): 28 mg via ORAL
  Filled 2014-11-24 (×5): qty 1

## 2014-11-24 MED ORDER — ALBUTEROL SULFATE (2.5 MG/3ML) 0.083% IN NEBU
2.5000 mg | INHALATION_SOLUTION | RESPIRATORY_TRACT | Status: DC | PRN
  Administered 2014-11-25 – 2014-11-26 (×2): 2.5 mg via RESPIRATORY_TRACT
  Filled 2014-11-24: qty 3

## 2014-11-24 MED ORDER — SODIUM CHLORIDE 0.9 % IV SOLN
INTRAVENOUS | Status: AC
Start: 2014-11-24 — End: 2014-11-25

## 2014-11-24 MED ORDER — ALBUTEROL SULFATE (2.5 MG/3ML) 0.083% IN NEBU
2.5000 mg | INHALATION_SOLUTION | RESPIRATORY_TRACT | Status: AC | PRN
  Filled 2014-11-24: qty 3

## 2014-11-24 MED ORDER — SODIUM CHLORIDE 0.9 % IJ SOLN
3.0000 mL | Freq: Two times a day (BID) | INTRAMUSCULAR | Status: DC
  Administered 2014-11-24 – 2014-11-27 (×4): 3 mL via INTRAVENOUS

## 2014-11-24 MED ORDER — SODIUM CHLORIDE 0.9 % IV SOLN
250.0000 mL | INTRAVENOUS | Status: DC | PRN
  Administered 2014-11-25 – 2014-11-26 (×2): 250 mL via INTRAVENOUS

## 2014-11-24 MED ORDER — FUROSEMIDE 10 MG/ML IJ SOLN
20.0000 mg | Freq: Two times a day (BID) | INTRAMUSCULAR | Status: DC
  Administered 2014-11-25 – 2014-11-27 (×5): 20 mg via INTRAVENOUS
  Filled 2014-11-24 (×5): qty 2

## 2014-11-24 MED ORDER — AMLODIPINE BESYLATE 5 MG PO TABS
10.0000 mg | ORAL_TABLET | Freq: Every day | ORAL | Status: DC
  Administered 2014-11-25 – 2014-11-27 (×3): 10 mg via ORAL
  Filled 2014-11-24 (×3): qty 2

## 2014-11-24 MED ORDER — ACETAMINOPHEN 650 MG RE SUPP
650.0000 mg | Freq: Four times a day (QID) | RECTAL | Status: DC | PRN
Start: 2014-11-24 — End: 2014-11-27

## 2014-11-24 MED ORDER — ONDANSETRON HCL 4 MG/2ML IJ SOLN: 4.0000 mg | Freq: Four times a day (QID) | INTRAMUSCULAR | Status: DC | PRN

## 2014-11-24 MED ORDER — IPRATROPIUM-ALBUTEROL 0.5-2.5 (3) MG/3ML IN SOLN
3.0000 mL | Freq: Once | RESPIRATORY_TRACT | Status: AC
  Administered 2014-11-24: 3 mL via RESPIRATORY_TRACT
  Filled 2014-11-24: qty 3

## 2014-11-24 MED ORDER — ENOXAPARIN SODIUM 30 MG/0.3ML ~~LOC~~ SOLN
30.0000 mg | SUBCUTANEOUS | Status: DC
  Administered 2014-11-25 – 2014-11-27 (×3): 30 mg via SUBCUTANEOUS
  Filled 2014-11-24 (×3): qty 0.3

## 2014-11-24 MED ORDER — LORATADINE 10 MG PO TABS
10.0000 mg | ORAL_TABLET | Freq: Every day | ORAL | Status: DC
  Administered 2014-11-25 – 2014-11-27 (×3): 10 mg via ORAL
  Filled 2014-11-24 (×3): qty 1

## 2014-11-24 MED ORDER — SODIUM CHLORIDE 0.9 % IV SOLN
INTRAVENOUS | Status: AC
  Administered 2014-11-24: via INTRAVENOUS

## 2014-11-24 MED ORDER — ALBUTEROL SULFATE (2.5 MG/3ML) 0.083% IN NEBU: 2.5000 mg | INHALATION_SOLUTION | RESPIRATORY_TRACT | Status: AC | PRN

## 2014-11-24 MED ORDER — ONDANSETRON HCL 4 MG PO TABS: 4.0000 mg | ORAL_TABLET | Freq: Four times a day (QID) | ORAL | Status: DC | PRN

## 2014-11-24 MED ORDER — CEFTRIAXONE SODIUM 1 G IJ SOLR
1.0000 g | Freq: Once | INTRAMUSCULAR | Status: AC
  Administered 2014-11-24: 1 g via INTRAVENOUS
  Filled 2014-11-24: qty 10

## 2014-11-24 MED ORDER — GUAIFENESIN ER 600 MG PO TB12
600.0000 mg | ORAL_TABLET | Freq: Two times a day (BID) | ORAL | Status: DC
Start: 2014-11-24 — End: 2014-11-27
  Administered 2014-11-24 – 2014-11-27 (×6): 600 mg via ORAL
  Filled 2014-11-24 (×6): qty 1

## 2014-11-24 MED ORDER — ALBUTEROL SULFATE (2.5 MG/3ML) 0.083% IN NEBU
2.5000 mg | INHALATION_SOLUTION | Freq: Once | RESPIRATORY_TRACT | Status: AC
  Administered 2014-11-24: 2.5 mg via RESPIRATORY_TRACT
  Filled 2014-11-24: qty 3

## 2014-11-24 MED ORDER — RIVASTIGMINE TARTRATE 3 MG PO CAPS
6.0000 mg | ORAL_CAPSULE | Freq: Every day | ORAL | Status: DC
  Administered 2014-11-25 – 2014-11-27 (×3): 6 mg via ORAL
  Filled 2014-11-24 (×5): qty 2

## 2014-11-24 MED ORDER — CARVEDILOL 12.5 MG PO TABS
12.5000 mg | ORAL_TABLET | Freq: Two times a day (BID) | ORAL | Status: DC
  Administered 2014-11-24 – 2014-11-27 (×6): 12.5 mg via ORAL
  Filled 2014-11-24 (×6): qty 1

## 2014-11-24 MED ORDER — TRAMADOL HCL 50 MG PO TABS: 50.0000 mg | ORAL_TABLET | Freq: Four times a day (QID) | ORAL | Status: DC | PRN

## 2014-11-24 MED ORDER — ENSURE ENLIVE PO LIQD
237.0000 mL | Freq: Two times a day (BID) | ORAL | Status: DC
  Administered 2014-11-25 – 2014-11-27 (×5): 237 mL via ORAL

## 2014-11-24 MED ORDER — ESCITALOPRAM OXALATE 10 MG PO TABS
10.0000 mg | ORAL_TABLET | Freq: Every day | ORAL | Status: DC
  Administered 2014-11-25 – 2014-11-27 (×3): 10 mg via ORAL
  Filled 2014-11-24 (×3): qty 1

## 2014-11-24 MED ORDER — PANTOPRAZOLE SODIUM 40 MG PO TBEC
40.0000 mg | DELAYED_RELEASE_TABLET | Freq: Every day | ORAL | Status: DC
  Administered 2014-11-25 – 2014-11-27 (×3): 40 mg via ORAL
  Filled 2014-11-24 (×2): qty 1

## 2014-11-24 NOTE — ED Notes (Signed)
Pt states she just does not feel herself, decreased appetite, can not sleep

## 2014-11-24 NOTE — ED Provider Notes (Signed)
CSN: [id number]     Arrival date & time 11/24/14  1611 History   First MD Initiated Contact with Patient 11/24/14 1616     Chief Complaint  Patient presents with  . Altered Mental Status      Patient is a 81 y.o. female presenting with altered mental status. The history is provided by the patient, the EMS personnel and the nursing home. The history is limited by the condition of the patient (Hx dementia).  Altered Mental Status Pt was seen at 1630. Per EMS, NH report and pt: NH states pt was agitated and combative after lunch and was given a PO ativan. Pt later on rolled out of bed onto the floor. NH states pt continues to "not act like herself" all day today, so they brought her to the ED for evaluation. NH staff also states pt's urine has a "strong odor" (pt was recently tx for UTI). Pt herself only c/o "not feeling like myself today." Denies CP, no cough, no abd pain, no vomiting/diarrhea, no reported fevers.     Past Medical History  Diagnosis Date  . COPD (chronic obstructive pulmonary disease) (HCC)   . CHF (congestive heart failure) (HCC)   . Delirium   . Metabolic alkalosis   . Hypertension   . Diabetes mellitus   . Anxiety   . Depression   . Panic attacks   . Chronic diarrhea of unknown origin   . Gout   . Arthritis   . Dementia   . Renal disorder    Past Surgical History  Procedure Laterality Date  . Inguinal hernia repair      right    Social History  Substance Use Topics  . Smoking status: Former Games developer  . Smokeless tobacco: None  . Alcohol Use: No    Review of Systems  Unable to perform ROS: Dementia      Allergies  Shellfish allergy  Home Medications   Prior to Admission medications   Medication Sig Start Date End Date Taking? Authorizing Provider  ALPRAZolam (XANAX) 0.25 MG tablet Take 0.25 mg by mouth 2 (two) times daily as needed for anxiety.   Yes Historical Provider, MD  amLODipine (NORVASC) 10 MG tablet Take 10 mg by mouth daily.     Yes  Historical Provider, MD  benzonatate (TESSALON) 200 MG capsule Take 200 mg by mouth 3 (three) times daily as needed for cough.    Yes Historical Provider, MD  carvedilol (COREG) 12.5 MG tablet Take 12.5 mg by mouth 2 (two) times daily.   Yes Historical Provider, MD  escitalopram (LEXAPRO) 10 MG tablet Take 10 mg by mouth daily.   Yes Historical Provider, MD  guaiFENesin (MUCINEX) 600 MG 12 hr tablet Take 600 mg by mouth 2 (two) times daily.   Yes Historical Provider, MD  levETIRAcetam (KEPPRA) 250 MG tablet Take 250 mg by mouth 2 (two) times daily.     Yes Historical Provider, MD  loratadine (CLARITIN) 10 MG tablet Take 10 mg by mouth daily.   Yes Historical Provider, MD  LORazepam (ATIVAN) 0.5 MG tablet Take 0.5 mg by mouth at bedtime.   Yes Historical Provider, MD  magnesium oxide (MAG-OX) 400 MG tablet Take 400 mg by mouth 2 (two) times daily.   Yes Historical Provider, MD  Memantine HCl ER (NAMENDA XR) 28 MG CP24 Take 28 mg by mouth daily.   Yes Historical Provider, MD  omeprazole (PRILOSEC) 20 MG capsule Take 20 mg by mouth daily.   Yes  Historical Provider, MD  OXYGEN Inhale 2 L into the lungs continuous.   Yes Historical Provider, MD  potassium chloride SA (K-DUR,KLOR-CON) 20 MEQ tablet Take 20 mEq by mouth daily.   Yes Historical Provider, MD  rivastigmine (EXELON) 6 MG capsule Take 6 mg by mouth daily.   Yes Historical Provider, MD  simethicone (MI-ACID GAS RELIEF) 80 MG chewable tablet Chew 80 mg by mouth 3 (three) times daily after meals.   Yes Historical Provider, MD  torsemide (DEMADEX) 100 MG tablet Take 50 mg by mouth daily.    Yes Historical Provider, MD  traMADol (ULTRAM) 50 MG tablet Take 50 mg by mouth 4 (four) times daily as needed for moderate pain.   Yes Historical Provider, MD  cephALEXin (KEFLEX) 500 MG capsule Take 1 capsule (500 mg total) by mouth 2 (two) times daily. Patient not taking: Reported on 11/24/2014 10/13/14   Pricilla Loveless, MD  furosemide (LASIX) 10 MG/ML  injection Inject 2 mLs (20 mg total) into the vein daily. Patient not taking: Reported on 10/13/2014 09/11/14   Oval Linsey, MD  predniSONE (DELTASONE) 20 MG tablet Take 1 tablet (20 mg total) by mouth daily with breakfast. Patient not taking: Reported on 10/13/2014 09/11/14   Oval Linsey, MD   BP 137/73 mmHg  Pulse 88  Temp(Src) 98.2 F (36.8 C) (Oral)  Resp 20  SpO2 93%  Filed Vitals:   11/24/14 1800 11/24/14 1815 11/24/14 1830 11/24/14 1900  BP: 139/80 143/79 143/72 137/73  Pulse: 87 85 90 88  Temp:  98.2 F (36.8 C)    TempSrc:  Oral    Resp: 18 16 20 20   SpO2: 94% 95% 93% 93%     Physical Exam  1635: Physical examination:  Nursing notes reviewed; Vital signs and O2 SAT reviewed;  Constitutional: Well developed, Well nourished, Uncomfortable appearing.; Head:  Normocephalic, atraumatic; Eyes: EOMI, PERRL, No scleral icterus; ENMT: Mouth and pharynx normal, Mucous membranes dry;; Neck: Supple, Full range of motion, No lymphadenopathy; Cardiovascular: Regular rate and rhythm, No gallop; Respiratory: Breath sounds coarse & equal bilaterally, No wheezes. Speaking phrases, Tachypneic, sitting upright.; Chest: Nontender, Movement normal; Abdomen: Soft, Nontender, Nondistended, Normal bowel sounds; Genitourinary: No CVA tenderness; Spine:  No midline CS, TS, LS tenderness.;; Extremities: Pulses normal, No tenderness, No edema, No calf edema or asymmetry.; Neuro: Awake, alert, mildly confused per hx dementia. No facial droop. Major CN grossly intact. Speech clear. Grips equal. Moves all extremities on stretcher spontaneously and to command without apparent gross focal motor deficits.; Skin: Color normal, Warm, Dry.   ED Course  Procedures (including critical care time) Labs Review   Imaging Review  I have personally reviewed and evaluated these images and lab results as part of my medical decision-making.   EKG Interpretation   Date/Time:  Friday November 24 2014 16:12:33  EST Ventricular Rate:  86 PR Interval:  181 QRS Duration: 92 QT Interval:  410 QTC Calculation: 490 R Axis:   -6 Text Interpretation:  Sinus rhythm Borderline prolonged QT interval When  compared with ECG of 10/13/2014 No significant change was found Confirmed  by [facility]  MD, Nicholos Johns [phone]) on 11/24/2014 6:41:11 PM      MDM  MDM Reviewed: previous chart, nursing note and vitals Reviewed previous: labs and ECG Interpretation: labs, ECG, x-ray and CT scan   Results for orders placed or performed during the hospital encounter of 11/24/14  Comprehensive metabolic panel  Result Value Ref Range   Sodium 139 135 - 145 mmol/L  Potassium 3.7 3.5 - 5.1 mmol/L   Chloride 103 101 - 111 mmol/L   CO2 26 22 - 32 mmol/L   Glucose, Bld 101 (H) 65 - 99 mg/dL   BUN 15 6 - 20 mg/dL   Creatinine, Ser 4.091.39 (H) 0.44 - 1.00 mg/dL   Calcium 8.8 (L) 8.9 - 10.3 mg/dL   Total Protein 7.2 6.5 - 8.1 g/dL   Albumin 3.6 3.5 - 5.0 g/dL   AST 19 15 - 41 U/L   ALT 14 14 - 54 U/L   Alkaline Phosphatase 138 (H) 38 - 126 U/L   Total Bilirubin 0.6 0.3 - 1.2 mg/dL   GFR calc non Af Amer 33 (L) >60 mL/min   GFR calc Af Amer 38 (L) >60 mL/min   Anion gap 10 5 - 15  Troponin I  Result Value Ref Range   Troponin I 0.03 <0.031 ng/mL  Lactic acid, plasma  Result Value Ref Range   Lactic Acid, Venous 0.8 0.5 - 2.0 mmol/L  CBC with Differential  Result Value Ref Range   WBC 8.1 4.0 - 10.5 K/uL   RBC 4.94 3.87 - 5.11 MIL/uL   Hemoglobin 12.3 12.0 - 15.0 g/dL   HCT 81.140.1 91.436.0 - 78.246.0 %   MCV 81.2 78.0 - 100.0 fL   MCH 24.9 (L) 26.0 - 34.0 pg   MCHC 30.7 30.0 - 36.0 g/dL   RDW 95.615.6 (H) 21.311.5 - 08.615.5 %   Platelets 259 150 - 400 K/uL   Neutrophils Relative % 66 %   Neutro Abs 5.4 1.7 - 7.7 K/uL   Lymphocytes Relative 18 %   Lymphs Abs 1.5 0.7 - 4.0 K/uL   Monocytes Relative 9 %   Monocytes Absolute 0.8 0.1 - 1.0 K/uL   Eosinophils Relative 6 %   Eosinophils Absolute 0.5 0.0 - 0.7 K/uL   Basophils Relative  1 %   Basophils Absolute 0.1 0.0 - 0.1 K/uL  Urinalysis, Routine w reflex microscopic  Result Value Ref Range   Color, Urine YELLOW YELLOW   APPearance HAZY (A) CLEAR   Specific Gravity, Urine 1.025 1.005 - 1.030   pH 5.5 5.0 - 8.0   Glucose, UA NEGATIVE NEGATIVE mg/dL   Hgb urine dipstick TRACE (A) NEGATIVE   Bilirubin Urine NEGATIVE NEGATIVE   Ketones, ur NEGATIVE NEGATIVE mg/dL   Protein, ur 578100 (A) NEGATIVE mg/dL   Nitrite POSITIVE (A) NEGATIVE   Leukocytes, UA SMALL (A) NEGATIVE  Brain natriuretic peptide  Result Value Ref Range   B Natriuretic Peptide 775.0 (H) 0.0 - 100.0 pg/mL  Urine microscopic-add on  Result Value Ref Range   Squamous Epithelial / LPF 0-5 (A) NONE SEEN   WBC, UA TOO NUMEROUS TO COUNT 0 - 5 WBC/hpf   RBC / HPF 0-5 0 - 5 RBC/hpf   Bacteria, UA MANY (A) NONE SEEN   Dg Chest 2 View 11/24/2014  CLINICAL DATA:  Altered mental status with fall. EXAM: CHEST  2 VIEW COMPARISON:  10/13/2014 FINDINGS: Cardiomediastinal silhouette is enlarged. Mediastinal contours appear intact. The aorta is torturous and contains atherosclerotic calcifications. There is no evidence of focal airspace consolidation, or pneumothorax. The interstitial markings are thickened with lower lobe predominance. There are bilateral pleural effusions. Osseous structures are without acute abnormality. Soft tissues are grossly normal. IMPRESSION: Enlarged cardiac silhouette. Findings suggestive of pulmonary edema with bilateral pleural effusions. Electronically Signed   By: Ted Mcalpineobrinka  Dimitrova M.D.   On: 11/24/2014 17:41   Ct Head Wo Contrast  11/24/2014  CLINICAL DATA:  Patient was agitated. Patient status post fall out of bed. Initial encounter. EXAM: CT HEAD WITHOUT CONTRAST CT CERVICAL SPINE WITHOUT CONTRAST TECHNIQUE: Multidetector CT imaging of the head and cervical spine was performed following the standard protocol without intravenous contrast. Multiplanar CT image reconstructions of the cervical  spine were also generated. COMPARISON:  CT brain 10/13/2014; brain and C-spine 09/23/2014. FINDINGS: CT HEAD FINDINGS Ventricles and sulci are prominent compatible with atrophy. Periventricular and subcortical white matter hypodensity compatible with chronic small vessel ischemic changes. No evidence for acute cortically based infarct, intracranial hemorrhage, mass lesion or mass-effect. Mild mucosal thickening ethmoid air cells. Fluid within the sphenoid sinus. Mastoid air cells are unremarkable. Calvarium is intact. CT CERVICAL SPINE FINDINGS No evidence for acute fracture or dislocation. Reversal of the normal cervical lordosis, re- demonstrated. C2-3 facet fusion. Prevertebral soft tissues are unremarkable. There are ground-glass and consolidative opacities involving the visualized left upper lobe. IMPRESSION: No acute intracranial process. No acute cervical spine fracture. Chronic microvascular ischemic changes. Degenerative changes and reversal of the normal cervical lordosis involving the cervical spine. Ground-glass and consolidative opacities within the left upper lobe. This is concerning for an acute infectious process or possibly pulmonary edema. Electronically Signed   By: Annia Belt M.D.   On: 11/24/2014 18:05   Ct Cervical Spine Wo Contrast 11/24/2014  CLINICAL DATA:  Patient was agitated. Patient status post fall out of bed. Initial encounter. EXAM: CT HEAD WITHOUT CONTRAST CT CERVICAL SPINE WITHOUT CONTRAST TECHNIQUE: Multidetector CT imaging of the head and cervical spine was performed following the standard protocol without intravenous contrast. Multiplanar CT image reconstructions of the cervical spine were also generated. COMPARISON:  CT brain 10/13/2014; brain and C-spine 09/23/2014. FINDINGS: CT HEAD FINDINGS Ventricles and sulci are prominent compatible with atrophy. Periventricular and subcortical white matter hypodensity compatible with chronic small vessel ischemic changes. No evidence  for acute cortically based infarct, intracranial hemorrhage, mass lesion or mass-effect. Mild mucosal thickening ethmoid air cells. Fluid within the sphenoid sinus. Mastoid air cells are unremarkable. Calvarium is intact. CT CERVICAL SPINE FINDINGS No evidence for acute fracture or dislocation. Reversal of the normal cervical lordosis, re- demonstrated. C2-3 facet fusion. Prevertebral soft tissues are unremarkable. There are ground-glass and consolidative opacities involving the visualized left upper lobe. IMPRESSION: No acute intracranial process. No acute cervical spine fracture. Chronic microvascular ischemic changes. Degenerative changes and reversal of the normal cervical lordosis involving the cervical spine. Ground-glass and consolidative opacities within the left upper lobe. This is concerning for an acute infectious process or possibly pulmonary edema. Electronically Signed   By: Annia Belt M.D.   On: 11/24/2014 18:05   Results for KAMORIE, ALDOUS (MRN [id number]) as of 11/24/2014 19:46  Ref. Range 09/10/2014 05:51 09/11/2014 05:27 09/23/2014 21:15 10/13/2014 13:10 11/24/2014 16:15  BUN Latest Ref Range: 6-20 mg/dL 56 (H) 49 (H) 21 (H) 17 15  Creatinine Latest Ref Range: 0.44-1.00 mg/dL 7.82 (H) 9.56 (H) 2.13 (H) 1.35 (H) 1.39 (H)   Results for HETAL, PROANO (MRN [id number]) as of 11/24/2014 19:46  Ref. Range 10/13/2014 13:10 11/24/2014 16:15  B Natriuretic Peptide Latest Ref Range: 0.0-100.0 pg/mL 323.0 (H) 775.0 (H)    1950:   Short neb given for pt's initial tachypnea, sitting upright, with O2 Sats 88% R/A. Sats improved to 94-95% on O2 4L N/C and pt appears more comfortable laying on stretcher. BNP elevated with CHF on CXR: IV lasix given. +UTI, UC pending; will dose IV rocephin.  T/C to [facility] Dr. Sharl Ma, case discussed, including:  HPI, pertinent PM/SHx, VS/PE, dx testing, ED course and treatment:  Agreeable to admit, requests to write temporary orders, obtain medical bed to Dr. Juanetta Gosling'  service.     Samuel Jester, DO 11/26/14 2004

## 2014-11-24 NOTE — ED Notes (Signed)
[place], arrived EMS, CBG 134, vitals stable. Reports pt got very agitated and yelling after lunch, was given PO Ativan, pt later rolled out of bed. Pt continue to be confused per staff, Pt was treated for UTI, urine still has strong odor

## 2014-11-24 NOTE — H&P (Addendum)
PCP:   HAWKINS,EDWARD Elbert Ewings, MD   Chief Complaint:  Shortness of breath  HPI: 81 y/o female who  has a past medical history of COPD (chronic obstructive pulmonary disease) (HCC); CHF (congestive heart failure) (HCC); Delirium; Metabolic alkalosis; Hypertension; Diabetes mellitus; Anxiety; Depression; Panic attacks; Chronic diarrhea of unknown origin; Gout; Arthritis; Dementia; and Renal disorder. Patient was brought to the hospital from nursing home for agitation and combative behavior after she was given ativan. Patient later on rolled out of bed.Patient was recently treated for UTI. In the ED she was found to be hypoxic with shortness of breath. O2 sats was 88%. Patient's chest x-ray showed pulmonary edema, BNP was elevated to 775. Patient has history of grade 2 diastolic dysfunction. She was given one dose of Lasix 40 mg IV in the ED. Patient is alert and oriented 2, she denies chest pain, no nausea vomiting or diarrhea. She denies dysuria. She has underlying history of dementia.   Allergies:   Allergies  Allergen Reactions  . Shellfish Allergy Diarrhea    Severe diarrhea and required hospitalization      Past Medical History  Diagnosis Date  . COPD (chronic obstructive pulmonary disease) (HCC)   . CHF (congestive heart failure) (HCC)   . Delirium   . Metabolic alkalosis   . Hypertension   . Diabetes mellitus   . Anxiety   . Depression   . Panic attacks   . Chronic diarrhea of unknown origin   . Gout   . Arthritis   . Dementia   . Renal disorder     Past Surgical History  Procedure Laterality Date  . Inguinal hernia repair      right    Prior to Admission medications   Medication Sig Start Date End Date Taking? Authorizing Provider  ALPRAZolam (XANAX) 0.25 MG tablet Take 0.25 mg by mouth 2 (two) times daily as needed for anxiety.   Yes Historical Provider, MD  amLODipine (NORVASC) 10 MG tablet Take 10 mg by mouth daily.     Yes Historical Provider, MD    benzonatate (TESSALON) 200 MG capsule Take 200 mg by mouth 3 (three) times daily as needed for cough.    Yes Historical Provider, MD  carvedilol (COREG) 12.5 MG tablet Take 12.5 mg by mouth 2 (two) times daily.   Yes Historical Provider, MD  escitalopram (LEXAPRO) 10 MG tablet Take 10 mg by mouth daily.   Yes Historical Provider, MD  guaiFENesin (MUCINEX) 600 MG 12 hr tablet Take 600 mg by mouth 2 (two) times daily.   Yes Historical Provider, MD  levETIRAcetam (KEPPRA) 250 MG tablet Take 250 mg by mouth 2 (two) times daily.     Yes Historical Provider, MD  loratadine (CLARITIN) 10 MG tablet Take 10 mg by mouth daily.   Yes Historical Provider, MD  LORazepam (ATIVAN) 0.5 MG tablet Take 0.5 mg by mouth at bedtime.   Yes Historical Provider, MD  magnesium oxide (MAG-OX) 400 MG tablet Take 400 mg by mouth 2 (two) times daily.   Yes Historical Provider, MD  Memantine HCl ER (NAMENDA XR) 28 MG CP24 Take 28 mg by mouth daily.   Yes Historical Provider, MD  omeprazole (PRILOSEC) 20 MG capsule Take 20 mg by mouth daily.   Yes Historical Provider, MD  OXYGEN Inhale 2 L into the lungs continuous.   Yes Historical Provider, MD  potassium chloride SA (K-DUR,KLOR-CON) 20 MEQ tablet Take 20 mEq by mouth daily.   Yes Historical Provider, MD  rivastigmine (EXELON) 6 MG capsule Take 6 mg by mouth daily.   Yes Historical Provider, MD  simethicone (MI-ACID GAS RELIEF) 80 MG chewable tablet Chew 80 mg by mouth 3 (three) times daily after meals.   Yes Historical Provider, MD  torsemide (DEMADEX) 100 MG tablet Take 50 mg by mouth daily.    Yes Historical Provider, MD  traMADol (ULTRAM) 50 MG tablet Take 50 mg by mouth 4 (four) times daily as needed for moderate pain.   Yes Historical Provider, MD  cephALEXin (KEFLEX) 500 MG capsule Take 1 capsule (500 mg total) by mouth 2 (two) times daily. Patient not taking: Reported on 11/24/2014 10/13/14   Pricilla LovelessScott Goldston, MD  furosemide (LASIX) 10 MG/ML injection Inject 2 mLs (20 mg  total) into the vein daily. Patient not taking: Reported on 10/13/2014 09/11/14   Oval Linseyichard Dondiego, MD  predniSONE (DELTASONE) 20 MG tablet Take 1 tablet (20 mg total) by mouth daily with breakfast. Patient not taking: Reported on 10/13/2014 09/11/14   Oval Linseyichard Dondiego, MD    Social History:  reports that she has quit smoking. She does not have any smokeless tobacco history on file. She reports that she does not drink alcohol or use illicit drugs.   All the positives are listed in BOLD  Review of Systems:  HEENT: Headache, blurred vision, runny nose, sore throat Neck: Hypothyroidism, hyperthyroidism,,lymphadenopathy Chest : Shortness of breath, history of COPD, Asthma Heart : Chest pain, history of coronary arterey disease GI:  Nausea, vomiting, diarrhea, constipation, GERD GU: Dysuria, urgency, frequency of urination, hematuria Neuro: Stroke, seizures, syncope Psych: Depression, anxiety, hallucinations   Physical Exam: Blood pressure 160/67, pulse 88, temperature 98.2 F (36.8 C), temperature source Oral, resp. rate 26, SpO2 93 %. Constitutional:   Patient is a well-developed and well-nourished female in no acute distress and cooperative with exam. Head: Normocephalic and atraumatic Mouth: Mucus membranes moist Eyes: PERRL, EOMI, conjunctivae normal Neck: Supple, No Thyromegaly Cardiovascular: RRR, S1 normal, S2 normal Pulmonary/Chest: Decreased breath sounds bilaterally at lung bases he he accepts and denies 01 Abdominal: Soft. Non-tender, non-distended, bowel sounds are normal, no masses, organomegaly, or guarding present.  Neurological: A&O x3, Strength is normal and symmetric bilaterally, cranial nerve II-XII are grossly intact, no focal motor deficit, sensory intact to light touch bilaterally.  Extremities : No Cyanosis, Clubbing or Edema  Labs on Admission:  Basic Metabolic Panel:  Recent Labs Lab 11/24/14 1615  NA 139  K 3.7  CL 103  CO2 26  GLUCOSE 101*  BUN 15    CREATININE 1.39*  CALCIUM 8.8*   ova and Liver Function Tests:  Recent Labs Lab 11/24/14 1615  AST 19  ALT 14  ALKPHOS 138*  BILITOT 0.6  PROT 7.2  ALBUMIN 3.6   No results for input(s): LIPASE, AMYLASE in the last 168 hours. No results for input(s): AMMONIA in the last 168 hours. CBC:  Recent Labs Lab 11/24/14 1615  WBC 8.1  NEUTROABS 5.4  HGB 12.3  HCT 40.1  MCV 81.2  PLT 259   Cardiac Enzymes:  Recent Labs Lab 11/24/14 1615  TROPONINI 0.03    BNP (last 3 results)  Recent Labs  09/05/14 1002 10/13/14 1310 11/24/14 1615  BNP 1481.0* 323.0* 775.0*    ProBNP (last 3 results) No results for input(s): PROBNP in the last 8760 hours.  CBG: No results for input(s): GLUCAP in the last 168 hours.  Radiological Exams on Admission: Dg Chest 2 View  11/24/2014  CLINICAL DATA:  Altered  mental status with fall. EXAM: CHEST  2 VIEW COMPARISON:  10/13/2014 FINDINGS: Cardiomediastinal silhouette is enlarged. Mediastinal contours appear intact. The aorta is torturous and contains atherosclerotic calcifications. There is no evidence of focal airspace consolidation, or pneumothorax. The interstitial markings are thickened with lower lobe predominance. There are bilateral pleural effusions. Osseous structures are without acute abnormality. Soft tissues are grossly normal. IMPRESSION: Enlarged cardiac silhouette. Findings suggestive of pulmonary edema with bilateral pleural effusions. Electronically Signed   By: Ted Mcalpine M.D.   On: 11/24/2014 17:41   Ct Head Wo Contrast  11/24/2014  CLINICAL DATA:  Patient was agitated. Patient status post fall out of bed. Initial encounter. EXAM: CT HEAD WITHOUT CONTRAST CT CERVICAL SPINE WITHOUT CONTRAST TECHNIQUE: Multidetector CT imaging of the head and cervical spine was performed following the standard protocol without intravenous contrast. Multiplanar CT image reconstructions of the cervical spine were also generated.  COMPARISON:  CT brain 10/13/2014; brain and C-spine 09/23/2014. FINDINGS: CT HEAD FINDINGS Ventricles and sulci are prominent compatible with atrophy. Periventricular and subcortical white matter hypodensity compatible with chronic small vessel ischemic changes. No evidence for acute cortically based infarct, intracranial hemorrhage, mass lesion or mass-effect. Mild mucosal thickening ethmoid air cells. Fluid within the sphenoid sinus. Mastoid air cells are unremarkable. Calvarium is intact. CT CERVICAL SPINE FINDINGS No evidence for acute fracture or dislocation. Reversal of the normal cervical lordosis, re- demonstrated. C2-3 facet fusion. Prevertebral soft tissues are unremarkable. There are ground-glass and consolidative opacities involving the visualized left upper lobe. IMPRESSION: No acute intracranial process. No acute cervical spine fracture. Chronic microvascular ischemic changes. Degenerative changes and reversal of the normal cervical lordosis involving the cervical spine. Ground-glass and consolidative opacities within the left upper lobe. This is concerning for an acute infectious process or possibly pulmonary edema. Electronically Signed   By: Annia Belt M.D.   On: 11/24/2014 18:05   Ct Cervical Spine Wo Contrast  11/24/2014  CLINICAL DATA:  Patient was agitated. Patient status post fall out of bed. Initial encounter. EXAM: CT HEAD WITHOUT CONTRAST CT CERVICAL SPINE WITHOUT CONTRAST TECHNIQUE: Multidetector CT imaging of the head and cervical spine was performed following the standard protocol without intravenous contrast. Multiplanar CT image reconstructions of the cervical spine were also generated. COMPARISON:  CT brain 10/13/2014; brain and C-spine 09/23/2014. FINDINGS: CT HEAD FINDINGS Ventricles and sulci are prominent compatible with atrophy. Periventricular and subcortical white matter hypodensity compatible with chronic small vessel ischemic changes. No evidence for acute cortically  based infarct, intracranial hemorrhage, mass lesion or mass-effect. Mild mucosal thickening ethmoid air cells. Fluid within the sphenoid sinus. Mastoid air cells are unremarkable. Calvarium is intact. CT CERVICAL SPINE FINDINGS No evidence for acute fracture or dislocation. Reversal of the normal cervical lordosis, re- demonstrated. C2-3 facet fusion. Prevertebral soft tissues are unremarkable. There are ground-glass and consolidative opacities involving the visualized left upper lobe. IMPRESSION: No acute intracranial process. No acute cervical spine fracture. Chronic microvascular ischemic changes. Degenerative changes and reversal of the normal cervical lordosis involving the cervical spine. Ground-glass and consolidative opacities within the left upper lobe. This is concerning for an acute infectious process or possibly pulmonary edema. Electronically Signed   By: Annia Belt M.D.   On: 11/24/2014 18:05    EKG: Independently reviewed.    Assessment/Plan Active Problems:   Dementia   Altered mental status   CHF exacerbation (HCC)   UTI (lower urinary tract infection)  Acute on chronic diastolic CHF Patient is presenting with CHF exacerbation,  received 1 dose of Lasix 40 mg IV 1 in the ED. Will continue with Lasix 20 mg IV every 12 hours. Strict intake and output.  UTI Patient has abnormal UA, received 1 dose of ceftriaxone the ED. Will obtain urine culture and continue Rocephin per pharmacy consultation.  Dementia Patient currently does not have any behavior disturbance, likely at baseline. Continue Exelon, Namenda  Hypertension Continue amlodipine, Coreg.  DVT prophylaxis Lovenox  Continue home medications including Keppra  Code status: Full code  Family discussion: No family at bedside   Time Spent on Admission: 60 min  Shacoria Latif S [facility] Pager: [phone] 11/24/2014, 8:25 PM  If 7PM-7AM, please contact night-coverage  [email or web address]  Password [email or web address]

## 2014-11-25 DIAGNOSIS — E876 Hypokalemia: Secondary | ICD-10-CM | POA: Diagnosis present

## 2014-11-25 DIAGNOSIS — R0902 Hypoxemia: Secondary | ICD-10-CM | POA: Diagnosis present

## 2014-11-25 DIAGNOSIS — G9341 Metabolic encephalopathy: Secondary | ICD-10-CM | POA: Diagnosis present

## 2014-11-25 DIAGNOSIS — F0390 Unspecified dementia without behavioral disturbance: Secondary | ICD-10-CM | POA: Diagnosis present

## 2014-11-25 DIAGNOSIS — N189 Chronic kidney disease, unspecified: Secondary | ICD-10-CM | POA: Diagnosis present

## 2014-11-25 DIAGNOSIS — Z87891 Personal history of nicotine dependence: Secondary | ICD-10-CM | POA: Diagnosis not present

## 2014-11-25 DIAGNOSIS — M1990 Unspecified osteoarthritis, unspecified site: Secondary | ICD-10-CM | POA: Diagnosis present

## 2014-11-25 DIAGNOSIS — J449 Chronic obstructive pulmonary disease, unspecified: Secondary | ICD-10-CM | POA: Diagnosis present

## 2014-11-25 DIAGNOSIS — I5043 Acute on chronic combined systolic (congestive) and diastolic (congestive) heart failure: Secondary | ICD-10-CM | POA: Diagnosis present

## 2014-11-25 DIAGNOSIS — N390 Urinary tract infection, site not specified: Secondary | ICD-10-CM | POA: Diagnosis present

## 2014-11-25 DIAGNOSIS — I130 Hypertensive heart and chronic kidney disease with heart failure and stage 1 through stage 4 chronic kidney disease, or unspecified chronic kidney disease: Secondary | ICD-10-CM | POA: Diagnosis present

## 2014-11-25 DIAGNOSIS — E1122 Type 2 diabetes mellitus with diabetic chronic kidney disease: Secondary | ICD-10-CM | POA: Diagnosis present

## 2014-11-25 DIAGNOSIS — F410 Panic disorder [episodic paroxysmal anxiety] without agoraphobia: Secondary | ICD-10-CM | POA: Diagnosis present

## 2014-11-25 LAB — COMPREHENSIVE METABOLIC PANEL
ALT: 13 U/L — ABNORMAL LOW (ref 14–54)
ANION GAP: 8 (ref 5–15)
AST: 18 U/L (ref 15–41)
Albumin: 3.1 g/dL — ABNORMAL LOW (ref 3.5–5.0)
Alkaline Phosphatase: 127 U/L — ABNORMAL HIGH (ref 38–126)
BILIRUBIN TOTAL: 0.6 mg/dL (ref 0.3–1.2)
BUN: 12 mg/dL (ref 6–20)
CHLORIDE: 104 mmol/L (ref 101–111)
CO2: 27 mmol/L (ref 22–32)
Calcium: 8.7 mg/dL — ABNORMAL LOW (ref 8.9–10.3)
Creatinine, Ser: 1.17 mg/dL — ABNORMAL HIGH (ref 0.44–1.00)
GFR, EST AFRICAN AMERICAN: 47 mL/min — AB (ref >60)
GFR, EST NON AFRICAN AMERICAN: 41 mL/min — AB (ref >60)
Glucose, Bld: 96 mg/dL (ref 65–99)
POTASSIUM: 3.1 mmol/L — AB (ref 3.5–5.1)
Sodium: 139 mmol/L (ref 135–145)
TOTAL PROTEIN: 6.4 g/dL — AB (ref 6.5–8.1)

## 2014-11-25 LAB — CBC
HCT: 37.2 % (ref 36.0–46.0)
Hemoglobin: 11.7 g/dL — ABNORMAL LOW (ref 12.0–15.0)
MCH: 25.3 pg — ABNORMAL LOW (ref 26.0–34.0)
MCHC: 31.5 g/dL (ref 30.0–36.0)
MCV: 80.5 fL (ref 78.0–100.0)
PLATELETS: 235 10*3/uL (ref 150–400)
RBC: 4.62 MIL/uL (ref 3.87–5.11)
RDW: 15.6 % — AB (ref 11.5–15.5)
WBC: 6.3 10*3/uL (ref 4.0–10.5)

## 2014-11-25 LAB — MRSA PCR SCREENING: MRSA BY PCR: NEGATIVE

## 2014-11-25 MED ORDER — POTASSIUM CHLORIDE CRYS ER 20 MEQ PO TBCR
20.0000 meq | EXTENDED_RELEASE_TABLET | Freq: Two times a day (BID) | ORAL | Status: DC
  Administered 2014-11-25 – 2014-11-27 (×5): 20 meq via ORAL
  Filled 2014-11-25 (×5): qty 1

## 2014-11-25 MED ORDER — DEXTROSE 5 % IV SOLN
1.0000 g | INTRAVENOUS | Status: DC
  Administered 2014-11-25 – 2014-11-26 (×2): 1 g via INTRAVENOUS
  Filled 2014-11-25 (×3): qty 10

## 2014-11-25 MED ORDER — POTASSIUM CHLORIDE CRYS ER 20 MEQ PO TBCR
40.0000 meq | EXTENDED_RELEASE_TABLET | Freq: Once | ORAL | Status: AC
  Administered 2014-11-25: 40 meq via ORAL
  Filled 2014-11-25: qty 2

## 2014-11-25 NOTE — Progress Notes (Signed)
Pt arrived on unit 300.  MRSA swap sent.

## 2014-11-25 NOTE — Progress Notes (Signed)
Patient is still confused, lungs with wheezes,

## 2014-11-25 NOTE — Progress Notes (Signed)
ANTIBIOTIC CONSULT NOTE - INITIAL  Pharmacy Consult for Ceftriaxone Indication: UTI  Allergies  Allergen Reactions  . Shellfish Allergy Diarrhea    Severe diarrhea and required hospitalization    Patient Measurements: Weight: 137 lb (62.143 kg)  Vital Signs: Temp: 99.8 F (37.7 C) (11/19 0650) Temp Source: Oral (11/19 0650) BP: 136/53 mmHg (11/19 0650) Pulse Rate: 85 (11/19 0650) Intake/Output from previous day:   Intake/Output from this shift:    Labs:  Recent Labs  11/24/14 1615 11/25/14 0633  WBC 8.1 6.3  HGB 12.3 11.7*  PLT 259 235  CREATININE 1.39* 1.17*   Estimated Creatinine Clearance: 29.3 mL/min (by C-G formula based on Cr of 1.17). No results for input(s): VANCOTROUGH, VANCOPEAK, VANCORANDOM, GENTTROUGH, GENTPEAK, GENTRANDOM, TOBRATROUGH, TOBRAPEAK, TOBRARND, AMIKACINPEAK, AMIKACINTROU, AMIKACIN in the last 72 hours.   Microbiology: Recent Results (from the past 720 hour(s))  MRSA PCR Screening     Status: None   Collection Time: 11/25/14  2:30 AM  Result Value Ref Range Status   MRSA by PCR NEGATIVE NEGATIVE Final    Comment:        The GeneXpert MRSA Assay (FDA approved for NASAL specimens only), is one component of a comprehensive MRSA colonization surveillance program. It is not intended to diagnose MRSA infection nor to guide or monitor treatment for MRSA infections.     Medical History: Past Medical History  Diagnosis Date  . COPD (chronic obstructive pulmonary disease) (HCC)   . CHF (congestive heart failure) (HCC)   . Delirium   . Metabolic alkalosis   . Hypertension   . Diabetes mellitus   . Anxiety   . Depression   . Panic attacks   . Chronic diarrhea of unknown origin   . Gout   . Arthritis   . Dementia   . Renal disorder     Medications:  Prescriptions prior to admission  Medication Sig Dispense Refill Last Dose  . ALPRAZolam (XANAX) 0.25 MG tablet Take 0.25 mg by mouth 2 (two) times daily as needed for anxiety.    11/24/2014 at 1335  . amLODipine (NORVASC) 10 MG tablet Take 10 mg by mouth daily.     11/24/2014 at 900a  . carvedilol (COREG) 12.5 MG tablet Take 12.5 mg by mouth 2 (two) times daily.   11/24/2014 at 900a  . escitalopram (LEXAPRO) 10 MG tablet Take 10 mg by mouth daily.   11/24/2014 at 900a  . guaiFENesin (MUCINEX) 600 MG 12 hr tablet Take 600 mg by mouth 2 (two) times daily.   11/24/2014 at 900a  . levETIRAcetam (KEPPRA) 250 MG tablet Take 250 mg by mouth 2 (two) times daily.     11/24/2014 at 900a  . loratadine (CLARITIN) 10 MG tablet Take 10 mg by mouth daily.   11/24/2014 at 900a  . LORazepam (ATIVAN) 0.5 MG tablet Take 0.5 mg by mouth at bedtime.   11/23/2014 at 2100  . magnesium oxide (MAG-OX) 400 MG tablet Take 400 mg by mouth 2 (two) times daily.   11/24/2014 at 900a  . Memantine HCl ER (NAMENDA XR) 28 MG CP24 Take 28 mg by mouth daily.   11/24/2014 at 900a  . omeprazole (PRILOSEC) 20 MG capsule Take 20 mg by mouth daily.   11/24/2014 at 900a  . OXYGEN Inhale 2 L into the lungs continuous.   11/23/2014 at 2000  . potassium chloride SA (K-DUR,KLOR-CON) 20 MEQ tablet Take 20 mEq by mouth daily.   11/24/2014 at 800a  . rivastigmine (EXELON) 6 MG  capsule Take 6 mg by mouth daily.   11/24/2014 at 900a  . simethicone (MI-ACID GAS RELIEF) 80 MG chewable tablet Chew 80 mg by mouth 3 (three) times daily after meals.   11/24/2014 at 1300  . torsemide (DEMADEX) 100 MG tablet Take 50 mg by mouth daily.    11/24/2014 at 900a  . traMADol (ULTRAM) 50 MG tablet Take 50 mg by mouth 4 (four) times daily as needed for moderate pain.   11/24/2014 at 402a  . benzonatate (TESSALON) 200 MG capsule Take 200 mg by mouth 3 (three) times daily as needed for cough.    Unknown at Unknown time  . cephALEXin (KEFLEX) 500 MG capsule Take 1 capsule (500 mg total) by mouth 2 (two) times daily. (Patient not taking: Reported on 11/24/2014) 14 capsule 0 Unknown at Unknown time  . furosemide (LASIX) 10 MG/ML injection Inject  2 mLs (20 mg total) into the vein daily. (Patient not taking: Reported on 10/13/2014) 4 mL 0 Unknown at Unknown time  . predniSONE (DELTASONE) 20 MG tablet Take 1 tablet (20 mg total) by mouth daily with breakfast. (Patient not taking: Reported on 10/13/2014) 5 tablet 0 Unknown at Unknown time   Assessment: 81 yo demented patient admitted for altered mental status. No dysuria but UA shows many bacteria, small leukocytes, nitrate positive. Empiric treatment for UTI with Rocephin. Temp 99.8,  WBC normal.  Goal of Therapy:  eradication of infection  Plan:  Ceftriaxone 1gm iv q24h Follow up culture results Monitor vital signs and labs  Elder CyphersLorie Tilia Faso, BS Pharm D, BCPS Clinical Pharmacist Pager [phone] 11/25/2014,7:31 AM

## 2014-11-25 NOTE — Progress Notes (Signed)
Initial Nutrition Assessment  INTERVENTION:  Ensure Enlive po BID, each supplement provides 350 kcal and 20 grams of protein  NUTRITION DIAGNOSIS:   Inadequate oral intake related to lethargy/confusion as evidenced by meal completion < 50%.  GOAL:   Patient will meet greater than or equal to 90% of their needs  MONITOR:   PO intake, Supplement acceptance, Labs, Weight trends, I & O's  REASON FOR ASSESSMENT:   Malnutrition Screening Tool   ASSESSMENT:   Pt with hx of dementia, moderately severe COPD, CHF and lives at ALF admitted with UTI.    Medications reviewed and include: lasix, mag-ox, KCl Labs reviewed: potassium low (3.1) Unable to complete Nutrition-Focused physical exam at this time.  Weight trends PTA 130-145 lb. Unable to confirm weight loss PTA.   Diet Order:  Diet Heart Room service appropriate?: Yes; Fluid consistency:: Thin  Skin:  Reviewed, no issues  Last BM:  unknown  Height:   Ht Readings from Last 1 Encounters:  11/25/14 5\' 4"  (1.626 m)   Weight:   Wt Readings from Last 1 Encounters:  11/24/14 137 lb (62.143 kg)   Ideal Body Weight:  54.5 kg  BMI:  Body mass index is 23.5 kg/(m^2).  Estimated Nutritional Needs:   Kcal:  1300-1500  Protein:  65-75 grams  Fluid:  > 1.5 L/day  EDUCATION NEEDS:   No education needs identified at this time  Kendell BaneHeather Hokulani Rogel RD, LDN, CNSC [phone] Pager [phone] After Hours Pager

## 2014-11-25 NOTE — Progress Notes (Signed)
Subjective: She was admitted with altered mental status, probable urinary tract infection and pulmonary edema. Although apparently she's better as far as her mental status is concerned she is clearly not back at baseline. She does not recognize me which is not her normal situation  Objective: Vital signs in last 24 hours: Temp:  [97.4 F (36.3 C)-99.8 F (37.7 C)] 99.8 F (37.7 C) (11/19 0650) Pulse Rate:  [83-90] 85 (11/19 0650) Resp:  [16-26] 20 (11/19 0650) BP: (127-160)/(53-81) 136/53 mmHg (11/19 0650) SpO2:  [88 %-95 %] 94 % (11/19 0650) Weight:  [62.143 kg (137 lb)] 62.143 kg (137 lb) (11/18 2038) Weight change:     Intake/Output from previous day:    PHYSICAL EXAM General appearance: alert and mild distress Resp: rales bibasilar Cardio: regular rate and rhythm, S1, S2 normal, no murmur, click, rub or gallop GI: soft, non-tender; bowel sounds normal; no masses,  no organomegaly Extremities: extremities normal, atraumatic, no cyanosis or edema  Lab Results:  Results for orders placed or performed during the hospital encounter of 11/24/14 (from the past 48 hour(s))  Comprehensive metabolic panel     Status: Abnormal   Collection Time: 11/24/14  4:15 PM  Result Value Ref Range   Sodium 139 135 - 145 mmol/L   Potassium 3.7 3.5 - 5.1 mmol/L   Chloride 103 101 - 111 mmol/L   CO2 26 22 - 32 mmol/L   Glucose, Bld 101 (H) 65 - 99 mg/dL   BUN 15 6 - 20 mg/dL   Creatinine, Ser 1.39 (H) 0.44 - 1.00 mg/dL   Calcium 8.8 (L) 8.9 - 10.3 mg/dL   Total Protein 7.2 6.5 - 8.1 g/dL   Albumin 3.6 3.5 - 5.0 g/dL   AST 19 15 - 41 U/L   ALT 14 14 - 54 U/L   Alkaline Phosphatase 138 (H) 38 - 126 U/L   Total Bilirubin 0.6 0.3 - 1.2 mg/dL   GFR calc non Af Amer 33 (L) >60 mL/min   GFR calc Af Amer 38 (L) >60 mL/min    Comment: (NOTE) The eGFR has been calculated using the CKD EPI equation. This calculation has not been validated in all clinical situations. eGFR's persistently <60 mL/min  signify possible Chronic Kidney Disease.    Anion gap 10 5 - 15  Troponin I     Status: None   Collection Time: 11/24/14  4:15 PM  Result Value Ref Range   Troponin I 0.03 <0.031 ng/mL    Comment:        NO INDICATION OF MYOCARDIAL INJURY.   CBC with Differential     Status: Abnormal   Collection Time: 11/24/14  4:15 PM  Result Value Ref Range   WBC 8.1 4.0 - 10.5 K/uL   RBC 4.94 3.87 - 5.11 MIL/uL   Hemoglobin 12.3 12.0 - 15.0 g/dL   HCT 40.1 36.0 - 46.0 %   MCV 81.2 78.0 - 100.0 fL   MCH 24.9 (L) 26.0 - 34.0 pg   MCHC 30.7 30.0 - 36.0 g/dL   RDW 15.6 (H) 11.5 - 15.5 %   Platelets 259 150 - 400 K/uL   Neutrophils Relative % 66 %   Neutro Abs 5.4 1.7 - 7.7 K/uL   Lymphocytes Relative 18 %   Lymphs Abs 1.5 0.7 - 4.0 K/uL   Monocytes Relative 9 %   Monocytes Absolute 0.8 0.1 - 1.0 K/uL   Eosinophils Relative 6 %   Eosinophils Absolute 0.5 0.0 - 0.7 K/uL  Basophils Relative 1 %   Basophils Absolute 0.1 0.0 - 0.1 K/uL  Brain natriuretic peptide     Status: Abnormal   Collection Time: 11/24/14  4:15 PM  Result Value Ref Range   B Natriuretic Peptide 775.0 (H) 0.0 - 100.0 pg/mL  Lactic acid, plasma     Status: None   Collection Time: 11/24/14  4:51 PM  Result Value Ref Range   Lactic Acid, Venous 0.8 0.5 - 2.0 mmol/L  Urinalysis, Routine w reflex microscopic     Status: Abnormal   Collection Time: 11/24/14  6:13 PM  Result Value Ref Range   Color, Urine YELLOW YELLOW   APPearance HAZY (A) CLEAR   Specific Gravity, Urine 1.025 1.005 - 1.030   pH 5.5 5.0 - 8.0   Glucose, UA NEGATIVE NEGATIVE mg/dL   Hgb urine dipstick TRACE (A) NEGATIVE   Bilirubin Urine NEGATIVE NEGATIVE   Ketones, ur NEGATIVE NEGATIVE mg/dL   Protein, ur 100 (A) NEGATIVE mg/dL   Nitrite POSITIVE (A) NEGATIVE   Leukocytes, UA SMALL (A) NEGATIVE  Urine microscopic-add on     Status: Abnormal   Collection Time: 11/24/14  6:13 PM  Result Value Ref Range   Squamous Epithelial / LPF 0-5 (A) NONE SEEN     Comment: Please note change in reference range.   WBC, UA TOO NUMEROUS TO COUNT 0 - 5 WBC/hpf    Comment: Please note change in reference range.   RBC / HPF 0-5 0 - 5 RBC/hpf    Comment: Please note change in reference range.   Bacteria, UA MANY (A) NONE SEEN    Comment: Please note change in reference range.  Lactic acid, plasma     Status: None   Collection Time: 11/24/14  7:33 PM  Result Value Ref Range   Lactic Acid, Venous 1.2 0.5 - 2.0 mmol/L  MRSA PCR Screening     Status: None   Collection Time: 11/25/14  2:30 AM  Result Value Ref Range   MRSA by PCR NEGATIVE NEGATIVE    Comment:        The GeneXpert MRSA Assay (FDA approved for NASAL specimens only), is one component of a comprehensive MRSA colonization surveillance program. It is not intended to diagnose MRSA infection nor to guide or monitor treatment for MRSA infections.   CBC     Status: Abnormal   Collection Time: 11/25/14  6:33 AM  Result Value Ref Range   WBC 6.3 4.0 - 10.5 K/uL   RBC 4.62 3.87 - 5.11 MIL/uL   Hemoglobin 11.7 (L) 12.0 - 15.0 g/dL   HCT 37.2 36.0 - 46.0 %   MCV 80.5 78.0 - 100.0 fL   MCH 25.3 (L) 26.0 - 34.0 pg   MCHC 31.5 30.0 - 36.0 g/dL   RDW 15.6 (H) 11.5 - 15.5 %   Platelets 235 150 - 400 K/uL  Comprehensive metabolic panel     Status: Abnormal   Collection Time: 11/25/14  6:33 AM  Result Value Ref Range   Sodium 139 135 - 145 mmol/L   Potassium 3.1 (L) 3.5 - 5.1 mmol/L   Chloride 104 101 - 111 mmol/L   CO2 27 22 - 32 mmol/L   Glucose, Bld 96 65 - 99 mg/dL   BUN 12 6 - 20 mg/dL   Creatinine, Ser 1.17 (H) 0.44 - 1.00 mg/dL   Calcium 8.7 (L) 8.9 - 10.3 mg/dL   Total Protein 6.4 (L) 6.5 - 8.1 g/dL   Albumin 3.1 (  L) 3.5 - 5.0 g/dL   AST 18 15 - 41 U/L   ALT 13 (L) 14 - 54 U/L   Alkaline Phosphatase 127 (H) 38 - 126 U/L   Total Bilirubin 0.6 0.3 - 1.2 mg/dL   GFR calc non Af Amer 41 (L) >60 mL/min   GFR calc Af Amer 47 (L) >60 mL/min    Comment: (NOTE) The eGFR has been  calculated using the CKD EPI equation. This calculation has not been validated in all clinical situations. eGFR's persistently <60 mL/min signify possible Chronic Kidney Disease.    Anion gap 8 5 - 15    ABGS No results for input(s): PHART, PO2ART, TCO2, HCO3 in the last 72 hours.  Invalid input(s): PCO2 CULTURES Recent Results (from the past 240 hour(s))  MRSA PCR Screening     Status: None   Collection Time: 11/25/14  2:30 AM  Result Value Ref Range Status   MRSA by PCR NEGATIVE NEGATIVE Final    Comment:        The GeneXpert MRSA Assay (FDA approved for NASAL specimens only), is one component of a comprehensive MRSA colonization surveillance program. It is not intended to diagnose MRSA infection nor to guide or monitor treatment for MRSA infections.    Studies/Results: Dg Chest 2 View  11/24/2014  CLINICAL DATA:  Altered mental status with fall. EXAM: CHEST  2 VIEW COMPARISON:  10/13/2014 FINDINGS: Cardiomediastinal silhouette is enlarged. Mediastinal contours appear intact. The aorta is torturous and contains atherosclerotic calcifications. There is no evidence of focal airspace consolidation, or pneumothorax. The interstitial markings are thickened with lower lobe predominance. There are bilateral pleural effusions. Osseous structures are without acute abnormality. Soft tissues are grossly normal. IMPRESSION: Enlarged cardiac silhouette. Findings suggestive of pulmonary edema with bilateral pleural effusions. Electronically Signed   By: Fidela Salisbury M.D.   On: 11/24/2014 17:41   Ct Head Wo Contrast  11/24/2014  CLINICAL DATA:  Patient was agitated. Patient status post fall out of bed. Initial encounter. EXAM: CT HEAD WITHOUT CONTRAST CT CERVICAL SPINE WITHOUT CONTRAST TECHNIQUE: Multidetector CT imaging of the head and cervical spine was performed following the standard protocol without intravenous contrast. Multiplanar CT image reconstructions of the cervical spine  were also generated. COMPARISON:  CT brain 10/13/2014; brain and C-spine 09/23/2014. FINDINGS: CT HEAD FINDINGS Ventricles and sulci are prominent compatible with atrophy. Periventricular and subcortical white matter hypodensity compatible with chronic small vessel ischemic changes. No evidence for acute cortically based infarct, intracranial hemorrhage, mass lesion or mass-effect. Mild mucosal thickening ethmoid air cells. Fluid within the sphenoid sinus. Mastoid air cells are unremarkable. Calvarium is intact. CT CERVICAL SPINE FINDINGS No evidence for acute fracture or dislocation. Reversal of the normal cervical lordosis, re- demonstrated. C2-3 facet fusion. Prevertebral soft tissues are unremarkable. There are ground-glass and consolidative opacities involving the visualized left upper lobe. IMPRESSION: No acute intracranial process. No acute cervical spine fracture. Chronic microvascular ischemic changes. Degenerative changes and reversal of the normal cervical lordosis involving the cervical spine. Ground-glass and consolidative opacities within the left upper lobe. This is concerning for an acute infectious process or possibly pulmonary edema. Electronically Signed   By: Lovey Newcomer M.D.   On: 11/24/2014 18:05   Ct Cervical Spine Wo Contrast  11/24/2014  CLINICAL DATA:  Patient was agitated. Patient status post fall out of bed. Initial encounter. EXAM: CT HEAD WITHOUT CONTRAST CT CERVICAL SPINE WITHOUT CONTRAST TECHNIQUE: Multidetector CT imaging of the head and cervical spine was performed  following the standard protocol without intravenous contrast. Multiplanar CT image reconstructions of the cervical spine were also generated. COMPARISON:  CT brain 10/13/2014; brain and C-spine 09/23/2014. FINDINGS: CT HEAD FINDINGS Ventricles and sulci are prominent compatible with atrophy. Periventricular and subcortical white matter hypodensity compatible with chronic small vessel ischemic changes. No evidence for  acute cortically based infarct, intracranial hemorrhage, mass lesion or mass-effect. Mild mucosal thickening ethmoid air cells. Fluid within the sphenoid sinus. Mastoid air cells are unremarkable. Calvarium is intact. CT CERVICAL SPINE FINDINGS No evidence for acute fracture or dislocation. Reversal of the normal cervical lordosis, re- demonstrated. C2-3 facet fusion. Prevertebral soft tissues are unremarkable. There are ground-glass and consolidative opacities involving the visualized left upper lobe. IMPRESSION: No acute intracranial process. No acute cervical spine fracture. Chronic microvascular ischemic changes. Degenerative changes and reversal of the normal cervical lordosis involving the cervical spine. Ground-glass and consolidative opacities within the left upper lobe. This is concerning for an acute infectious process or possibly pulmonary edema. Electronically Signed   By: Lovey Newcomer M.D.   On: 11/24/2014 18:05    Medications:  Prior to Admission:  Prescriptions prior to admission  Medication Sig Dispense Refill Last Dose  . ALPRAZolam (XANAX) 0.25 MG tablet Take 0.25 mg by mouth 2 (two) times daily as needed for anxiety.   11/24/2014 at 1335  . amLODipine (NORVASC) 10 MG tablet Take 10 mg by mouth daily.     11/24/2014 at 900a  . carvedilol (COREG) 12.5 MG tablet Take 12.5 mg by mouth 2 (two) times daily.   11/24/2014 at 900a  . escitalopram (LEXAPRO) 10 MG tablet Take 10 mg by mouth daily.   11/24/2014 at 900a  . guaiFENesin (MUCINEX) 600 MG 12 hr tablet Take 600 mg by mouth 2 (two) times daily.   11/24/2014 at 900a  . levETIRAcetam (KEPPRA) 250 MG tablet Take 250 mg by mouth 2 (two) times daily.     11/24/2014 at 900a  . loratadine (CLARITIN) 10 MG tablet Take 10 mg by mouth daily.   11/24/2014 at [place]  . LORazepam (ATIVAN) 0.5 MG tablet Take 0.5 mg by mouth at bedtime.   11/23/2014 at 2100  . magnesium oxide (MAG-OX) 400 MG tablet Take 400 mg by mouth 2 (two) times daily.   11/24/2014  at [place]  . Memantine HCl ER (NAMENDA XR) 28 MG CP24 Take 28 mg by mouth daily.   11/24/2014 at 900a  . omeprazole (PRILOSEC) 20 MG capsule Take 20 mg by mouth daily.   11/24/2014 at [place]  . OXYGEN Inhale 2 L into the lungs continuous.   11/23/2014 at 2000  . potassium chloride SA (K-DUR,KLOR-CON) 20 MEQ tablet Take 20 mEq by mouth daily.   11/24/2014 at [place]  . rivastigmine (EXELON) 6 MG capsule Take 6 mg by mouth daily.   11/24/2014 at 900a  . simethicone (MI-ACID GAS RELIEF) 80 MG chewable tablet Chew 80 mg by mouth 3 (three) times daily after meals.   11/24/2014 at 1300  . torsemide (DEMADEX) 100 MG tablet Take 50 mg by mouth daily.    11/24/2014 at 900a  . traMADol (ULTRAM) 50 MG tablet Take 50 mg by mouth 4 (four) times daily as needed for moderate pain.   11/24/2014 at 402a  . benzonatate (TESSALON) 200 MG capsule Take 200 mg by mouth 3 (three) times daily as needed for cough.    Unknown at Unknown time  . cephALEXin (KEFLEX) 500 MG capsule Take 1 capsule (500 mg total) by  mouth 2 (two) times daily. (Patient not taking: Reported on 11/24/2014) 14 capsule 0 Unknown at Unknown time  . furosemide (LASIX) 10 MG/ML injection Inject 2 mLs (20 mg total) into the vein daily. (Patient not taking: Reported on 10/13/2014) 4 mL 0 Unknown at Unknown time  . predniSONE (DELTASONE) 20 MG tablet Take 1 tablet (20 mg total) by mouth daily with breakfast. (Patient not taking: Reported on 10/13/2014) 5 tablet 0 Unknown at Unknown time   Scheduled: . sodium chloride   Intravenous STAT  . sodium chloride   Intravenous STAT  . amLODipine  10 mg Oral Daily  . carvedilol  12.5 mg Oral BID  . cefTRIAXone (ROCEPHIN)  IV  1 g Intravenous Q24H  . enoxaparin (LOVENOX) injection  30 mg Subcutaneous Q24H  . escitalopram  10 mg Oral Daily  . feeding supplement (ENSURE ENLIVE)  237 mL Oral BID BM  . furosemide  20 mg Intravenous Q12H  . guaiFENesin  600 mg Oral BID  . levETIRAcetam  250 mg Oral BID  . loratadine  10 mg  Oral Daily  . magnesium oxide  400 mg Oral BID  . memantine  28 mg Oral Daily  . pantoprazole  40 mg Oral Daily  . potassium chloride  20 mEq Oral BID  . potassium chloride  40 mEq Oral Once  . rivastigmine  6 mg Oral Daily  . sodium chloride  3 mL Intravenous Q12H   Continuous:  QQV:ZDGLOV chloride, acetaminophen **OR** acetaminophen, albuterol, albuterol, albuterol, ALPRAZolam, benzonatate, ondansetron **OR** ondansetron (ZOFRAN) IV, sodium chloride, traMADol  Assesment: She has altered mental status which is thought to be a metabolic encephalopathy. At baseline she has dementia but she typically recognizes me.  She has heart failure and I think that's better but she will continue with IV Lasix  She has urinary tract infection she's on Rocephin and I think that's better. She's been having increasing problems with UTIs recently  She has moderately severe COPD at baseline Active Problems:   Dementia   CHF (congestive heart failure) (HCC)   Altered mental status   CHF exacerbation (HCC)   UTI (lower urinary tract infection)    Plan: I changed her from observation to admission. She's not ready to go back to her assisted living facility. I will continue with her IV Lasix continue with IV antibiotics and replace her potassium    LOS: 0 days   Rahman Ferrall L 11/25/2014, 10:13 AM

## 2014-11-26 DIAGNOSIS — G9341 Metabolic encephalopathy: Secondary | ICD-10-CM | POA: Diagnosis present

## 2014-11-26 LAB — BASIC METABOLIC PANEL
ANION GAP: 7 (ref 5–15)
BUN: 11 mg/dL (ref 6–20)
CALCIUM: 8.7 mg/dL — AB (ref 8.9–10.3)
CO2: 26 mmol/L (ref 22–32)
CREATININE: 1.08 mg/dL — AB (ref 0.44–1.00)
Chloride: 106 mmol/L (ref 101–111)
GFR calc Af Amer: 52 mL/min — ABNORMAL LOW (ref >60)
GFR calc non Af Amer: 45 mL/min — ABNORMAL LOW (ref >60)
GLUCOSE: 104 mg/dL — AB (ref 65–99)
Potassium: 3.6 mmol/L (ref 3.5–5.1)
Sodium: 139 mmol/L (ref 135–145)

## 2014-11-26 NOTE — Progress Notes (Signed)
Subjective: She looks a little better today. She seems a little bit less confused. She has no new complaints  Objective: Vital signs in last 24 hours: Temp:  [98.1 F (36.7 C)-99.9 F (37.7 C)] 99.1 F (37.3 C) (11/20 0500) Pulse Rate:  [81-94] 81 (11/20 0500) Resp:  [20-22] 21 (11/20 0500) BP: (111-133)/(44-81) 129/62 mmHg (11/20 0500) SpO2:  [92 %-94 %] 92 % (11/20 0500) Weight change:  Last BM Date: 11/25/14  Intake/Output from previous day: 11/19 0701 - 11/20 0700 In: 360 [P.O.:360] Out: -   PHYSICAL EXAM General appearance: alert, mild distress and Mildly confused Resp: rales bibasilar Cardio: regular rate and rhythm, S1, S2 normal, no murmur, click, rub or gallop GI: soft, non-tender; bowel sounds normal; no masses,  no organomegaly Extremities: extremities normal, atraumatic, no cyanosis or edema  Lab Results:  Results for orders placed or performed during the hospital encounter of 11/24/14 (from the past 48 hour(s))  Comprehensive metabolic panel     Status: Abnormal   Collection Time: 11/24/14  4:15 PM  Result Value Ref Range   Sodium 139 135 - 145 mmol/[name]   Potassium 3.7 3.5 - 5.1 mmol/[name]   Chloride 103 101 - 111 mmol/[name]   CO2 26 22 - 32 mmol/[name]   Glucose, Bld 101 (H) 65 - 99 mg/dL   BUN 15 6 - 20 mg/dL   Creatinine, Ser 1.39 (H) 0.44 - 1.00 mg/dL   Calcium 8.8 ([name]) 8.9 - 10.3 mg/dL   Total Protein 7.2 6.5 - 8.1 g/dL   Albumin 3.6 3.5 - 5.0 g/dL   AST 19 15 - 41 U/[name]   ALT 14 14 - 54 U/[name]   Alkaline Phosphatase 138 (H) 38 - 126 U/[name]   Total Bilirubin 0.6 0.3 - 1.2 mg/dL   GFR calc non Af Amer 33 ([name]) >60 mL/min   GFR calc Af Amer 38 ([name]) >60 mL/min    Comment: (NOTE) The eGFR has been calculated using the CKD EPI equation. This calculation has not been validated in all clinical situations. eGFR's persistently <60 mL/min signify possible Chronic Kidney Disease.    Anion gap 10 5 - 15  Troponin I     Status: None   Collection Time: 11/24/14  4:15 PM  Result  Value Ref Range   Troponin I 0.03 <0.031 ng/mL    Comment:        NO INDICATION OF MYOCARDIAL INJURY.   CBC with Differential     Status: Abnormal   Collection Time: 11/24/14  4:15 PM  Result Value Ref Range   WBC 8.1 4.0 - 10.5 K/uL   RBC 4.94 3.87 - 5.11 MIL/uL   Hemoglobin 12.3 12.0 - 15.0 g/dL   HCT 40.1 36.0 - 46.0 %   MCV 81.2 78.0 - 100.0 fL   MCH 24.9 ([name]) 26.0 - 34.0 pg   MCHC 30.7 30.0 - 36.0 g/dL   RDW 15.6 (H) 11.5 - 15.5 %   Platelets 259 150 - 400 K/uL   Neutrophils Relative % 66 %   Neutro Abs 5.4 1.7 - 7.7 K/uL   Lymphocytes Relative 18 %   Lymphs Abs 1.5 0.7 - 4.0 K/uL   Monocytes Relative 9 %   Monocytes Absolute 0.8 0.1 - 1.0 K/uL   Eosinophils Relative 6 %   Eosinophils Absolute 0.5 0.0 - 0.7 K/uL   Basophils Relative 1 %   Basophils Absolute 0.1 0.0 - 0.1 K/uL  Brain natriuretic peptide     Status: Abnormal  Collection Time: 11/24/14  4:15 PM  Result Value Ref Range   B Natriuretic Peptide 775.0 (H) 0.0 - 100.0 pg/mL  Lactic acid, plasma     Status: None   Collection Time: 11/24/14  4:51 PM  Result Value Ref Range   Lactic Acid, Venous 0.8 0.5 - 2.0 mmol/[name]  Urinalysis, Routine w reflex microscopic     Status: Abnormal   Collection Time: 11/24/14  6:13 PM  Result Value Ref Range   Color, Urine YELLOW YELLOW   APPearance HAZY (A) CLEAR   Specific Gravity, Urine 1.025 1.005 - 1.030   pH 5.5 5.0 - 8.0   Glucose, UA NEGATIVE NEGATIVE mg/dL   Hgb urine dipstick TRACE (A) NEGATIVE   Bilirubin Urine NEGATIVE NEGATIVE   Ketones, ur NEGATIVE NEGATIVE mg/dL   Protein, ur 100 (A) NEGATIVE mg/dL   Nitrite POSITIVE (A) NEGATIVE   Leukocytes, UA SMALL (A) NEGATIVE  Urine microscopic-add on     Status: Abnormal   Collection Time: 11/24/14  6:13 PM  Result Value Ref Range   Squamous Epithelial / LPF 0-5 (A) NONE SEEN    Comment: Please note change in reference range.   WBC, UA TOO NUMEROUS TO COUNT 0 - 5 WBC/hpf    Comment: Please note change in reference  range.   RBC / HPF 0-5 0 - 5 RBC/hpf    Comment: Please note change in reference range.   Bacteria, UA MANY (A) NONE SEEN    Comment: Please note change in reference range.  Lactic acid, plasma     Status: None   Collection Time: 11/24/14  7:33 PM  Result Value Ref Range   Lactic Acid, Venous 1.2 0.5 - 2.0 mmol/[name]  MRSA PCR Screening     Status: None   Collection Time: 11/25/14  2:30 AM  Result Value Ref Range   MRSA by PCR NEGATIVE NEGATIVE    Comment:        The GeneXpert MRSA Assay (FDA approved for NASAL specimens only), is one component of a comprehensive MRSA colonization surveillance program. It is not intended to diagnose MRSA infection nor to guide or monitor treatment for MRSA infections.   CBC     Status: Abnormal   Collection Time: 11/25/14  6:33 AM  Result Value Ref Range   WBC 6.3 4.0 - 10.5 K/uL   RBC 4.62 3.87 - 5.11 MIL/uL   Hemoglobin 11.7 ([name]) 12.0 - 15.0 g/dL   HCT 37.2 36.0 - 46.0 %   MCV 80.5 78.0 - 100.0 fL   MCH 25.3 ([name]) 26.0 - 34.0 pg   MCHC 31.5 30.0 - 36.0 g/dL   RDW 15.6 (H) 11.5 - 15.5 %   Platelets 235 150 - 400 K/uL  Comprehensive metabolic panel     Status: Abnormal   Collection Time: 11/25/14  6:33 AM  Result Value Ref Range   Sodium 139 135 - 145 mmol/[name]   Potassium 3.1 ([name]) 3.5 - 5.1 mmol/[name]   Chloride 104 101 - 111 mmol/[name]   CO2 27 22 - 32 mmol/[name]   Glucose, Bld 96 65 - 99 mg/dL   BUN 12 6 - 20 mg/dL   Creatinine, Ser 1.17 (H) 0.44 - 1.00 mg/dL   Calcium 8.7 ([name]) 8.9 - 10.3 mg/dL   Total Protein 6.4 ([name]) 6.5 - 8.1 g/dL   Albumin 3.1 ([name]) 3.5 - 5.0 g/dL   AST 18 15 - 41 U/[name]   ALT 13 ([name]) 14 - 54 U/[name]   Alkaline  Phosphatase 127 (H) 38 - 126 U/[name]   Total Bilirubin 0.6 0.3 - 1.2 mg/dL   GFR calc non Af Amer 41 ([name]) >60 mL/min   GFR calc Af Amer 47 ([name]) >60 mL/min    Comment: (NOTE) The eGFR has been calculated using the CKD EPI equation. This calculation has not been validated in all clinical situations. eGFR's persistently <60 mL/min signify  possible Chronic Kidney Disease.    Anion gap 8 5 - 15  Basic metabolic panel     Status: Abnormal   Collection Time: 11/26/14  6:24 AM  Result Value Ref Range   Sodium 139 135 - 145 mmol/[name]   Potassium 3.6 3.5 - 5.1 mmol/[name]   Chloride 106 101 - 111 mmol/[name]   CO2 26 22 - 32 mmol/[name]   Glucose, Bld 104 (H) 65 - 99 mg/dL   BUN 11 6 - 20 mg/dL   Creatinine, Ser 1.08 (H) 0.44 - 1.00 mg/dL   Calcium 8.7 ([name]) 8.9 - 10.3 mg/dL   GFR calc non Af Amer 45 ([name]) >60 mL/min   GFR calc Af Amer 52 ([name]) >60 mL/min    Comment: (NOTE) The eGFR has been calculated using the CKD EPI equation. This calculation has not been validated in all clinical situations. eGFR's persistently <60 mL/min signify possible Chronic Kidney Disease.    Anion gap 7 5 - 15    ABGS No results for input(s): PHART, PO2ART, TCO2, HCO3 in the last 72 hours.  Invalid input(s): PCO2 CULTURES Recent Results (from the past 240 hour(s))  MRSA PCR Screening     Status: None   Collection Time: 11/25/14  2:30 AM  Result Value Ref Range Status   MRSA by PCR NEGATIVE NEGATIVE Final    Comment:        The GeneXpert MRSA Assay (FDA approved for NASAL specimens only), is one component of a comprehensive MRSA colonization surveillance program. It is not intended to diagnose MRSA infection nor to guide or monitor treatment for MRSA infections.    Studies/Results: Dg Chest 2 View  11/24/2014  CLINICAL DATA:  Altered mental status with fall. EXAM: CHEST  2 VIEW COMPARISON:  10/13/2014 FINDINGS: Cardiomediastinal silhouette is enlarged. Mediastinal contours appear intact. The aorta is torturous and contains atherosclerotic calcifications. There is no evidence of focal airspace consolidation, or pneumothorax. The interstitial markings are thickened with lower lobe predominance. There are bilateral pleural effusions. Osseous structures are without acute abnormality. Soft tissues are grossly normal. IMPRESSION: Enlarged cardiac silhouette.  Findings suggestive of pulmonary edema with bilateral pleural effusions. Electronically Signed   By: Fidela Salisbury M.D.   On: 11/24/2014 17:41   Ct Head Wo Contrast  11/24/2014  CLINICAL DATA:  Patient was agitated. Patient status post fall out of bed. Initial encounter. EXAM: CT HEAD WITHOUT CONTRAST CT CERVICAL SPINE WITHOUT CONTRAST TECHNIQUE: Multidetector CT imaging of the head and cervical spine was performed following the standard protocol without intravenous contrast. Multiplanar CT image reconstructions of the cervical spine were also generated. COMPARISON:  CT brain 10/13/2014; brain and C-spine 09/23/2014. FINDINGS: CT HEAD FINDINGS Ventricles and sulci are prominent compatible with atrophy. Periventricular and subcortical white matter hypodensity compatible with chronic small vessel ischemic changes. No evidence for acute cortically based infarct, intracranial hemorrhage, mass lesion or mass-effect. Mild mucosal thickening ethmoid air cells. Fluid within the sphenoid sinus. Mastoid air cells are unremarkable. Calvarium is intact. CT CERVICAL SPINE FINDINGS No evidence for acute fracture or dislocation. Reversal of the normal cervical lordosis,  re- demonstrated. C2-3 facet fusion. Prevertebral soft tissues are unremarkable. There are ground-glass and consolidative opacities involving the visualized left upper lobe. IMPRESSION: No acute intracranial process. No acute cervical spine fracture. Chronic microvascular ischemic changes. Degenerative changes and reversal of the normal cervical lordosis involving the cervical spine. Ground-glass and consolidative opacities within the left upper lobe. This is concerning for an acute infectious process or possibly pulmonary edema. Electronically Signed   By: Lovey Newcomer M.D.   On: 11/24/2014 18:05   Ct Cervical Spine Wo Contrast  11/24/2014  CLINICAL DATA:  Patient was agitated. Patient status post fall out of bed. Initial encounter. EXAM: CT HEAD  WITHOUT CONTRAST CT CERVICAL SPINE WITHOUT CONTRAST TECHNIQUE: Multidetector CT imaging of the head and cervical spine was performed following the standard protocol without intravenous contrast. Multiplanar CT image reconstructions of the cervical spine were also generated. COMPARISON:  CT brain 10/13/2014; brain and C-spine 09/23/2014. FINDINGS: CT HEAD FINDINGS Ventricles and sulci are prominent compatible with atrophy. Periventricular and subcortical white matter hypodensity compatible with chronic small vessel ischemic changes. No evidence for acute cortically based infarct, intracranial hemorrhage, mass lesion or mass-effect. Mild mucosal thickening ethmoid air cells. Fluid within the sphenoid sinus. Mastoid air cells are unremarkable. Calvarium is intact. CT CERVICAL SPINE FINDINGS No evidence for acute fracture or dislocation. Reversal of the normal cervical lordosis, re- demonstrated. C2-3 facet fusion. Prevertebral soft tissues are unremarkable. There are ground-glass and consolidative opacities involving the visualized left upper lobe. IMPRESSION: No acute intracranial process. No acute cervical spine fracture. Chronic microvascular ischemic changes. Degenerative changes and reversal of the normal cervical lordosis involving the cervical spine. Ground-glass and consolidative opacities within the left upper lobe. This is concerning for an acute infectious process or possibly pulmonary edema. Electronically Signed   By: Lovey Newcomer M.D.   On: 11/24/2014 18:05    Medications:  Prior to Admission:  Prescriptions prior to admission  Medication Sig Dispense Refill Last Dose  . ALPRAZolam (XANAX) 0.25 MG tablet Take 0.25 mg by mouth 2 (two) times daily as needed for anxiety.   11/24/2014 at 1335  . amLODipine (NORVASC) 10 MG tablet Take 10 mg by mouth daily.     11/24/2014 at 900a  . carvedilol (COREG) 12.5 MG tablet Take 12.5 mg by mouth 2 (two) times daily.   11/24/2014 at 900a  . escitalopram  (LEXAPRO) 10 MG tablet Take 10 mg by mouth daily.   11/24/2014 at 900a  . guaiFENesin (MUCINEX) 600 MG 12 hr tablet Take 600 mg by mouth 2 (two) times daily.   11/24/2014 at 900a  . levETIRAcetam (KEPPRA) 250 MG tablet Take 250 mg by mouth 2 (two) times daily.     11/24/2014 at 900a  . loratadine (CLARITIN) 10 MG tablet Take 10 mg by mouth daily.   11/24/2014 at [place]  . LORazepam (ATIVAN) 0.5 MG tablet Take 0.5 mg by mouth at bedtime.   11/23/2014 at 2100  . magnesium oxide (MAG-OX) 400 MG tablet Take 400 mg by mouth 2 (two) times daily.   11/24/2014 at [place]  . Memantine HCl ER (NAMENDA XR) 28 MG CP24 Take 28 mg by mouth daily.   11/24/2014 at 900a  . omeprazole (PRILOSEC) 20 MG capsule Take 20 mg by mouth daily.   11/24/2014 at [place]  . OXYGEN Inhale 2 [name] into the lungs continuous.   11/23/2014 at 2000  . potassium chloride SA (K-DUR,KLOR-CON) 20 MEQ tablet Take 20 mEq by mouth daily.   11/24/2014 at  800a  . rivastigmine (EXELON) 6 MG capsule Take 6 mg by mouth daily.   11/24/2014 at 900a  . simethicone (MI-ACID GAS RELIEF) 80 MG chewable tablet Chew 80 mg by mouth 3 (three) times daily after meals.   11/24/2014 at 1300  . torsemide (DEMADEX) 100 MG tablet Take 50 mg by mouth daily.    11/24/2014 at 900a  . traMADol (ULTRAM) 50 MG tablet Take 50 mg by mouth 4 (four) times daily as needed for moderate pain.   11/24/2014 at 402a  . benzonatate (TESSALON) 200 MG capsule Take 200 mg by mouth 3 (three) times daily as needed for cough.    Unknown at Unknown time  . cephALEXin (KEFLEX) 500 MG capsule Take 1 capsule (500 mg total) by mouth 2 (two) times daily. (Patient not taking: Reported on 11/24/2014) 14 capsule 0 Unknown at Unknown time  . furosemide (LASIX) 10 MG/ML injection Inject 2 mLs (20 mg total) into the vein daily. (Patient not taking: Reported on 10/13/2014) 4 mL 0 Unknown at Unknown time  . predniSONE (DELTASONE) 20 MG tablet Take 1 tablet (20 mg total) by mouth daily with breakfast. (Patient  not taking: Reported on 10/13/2014) 5 tablet 0 Unknown at Unknown time   Scheduled: . amLODipine  10 mg Oral Daily  . carvedilol  12.5 mg Oral BID  . cefTRIAXone (ROCEPHIN)  IV  1 g Intravenous Q24H  . enoxaparin (LOVENOX) injection  30 mg Subcutaneous Q24H  . escitalopram  10 mg Oral Daily  . feeding supplement (ENSURE ENLIVE)  237 mL Oral BID BM  . furosemide  20 mg Intravenous Q12H  . guaiFENesin  600 mg Oral BID  . levETIRAcetam  250 mg Oral BID  . loratadine  10 mg Oral Daily  . magnesium oxide  400 mg Oral BID  . memantine  28 mg Oral Daily  . pantoprazole  40 mg Oral Daily  . potassium chloride  20 mEq Oral BID  . rivastigmine  6 mg Oral Daily  . sodium chloride  3 mL Intravenous Q12H   Continuous:  TKP:[id number] chloride, acetaminophen **OR** acetaminophen, albuterol, ALPRAZolam, benzonatate, ondansetron **OR** ondansetron (ZOFRAN) IV, sodium chloride, traMADol  Assesment: She was admitted with UTI which is being treated. Culture is still pending. She had another recent UTI that was treated at her assisted living facility. She has acute on chronic systolic heart failure which seems better. She had altered mental status thought to be related to the urinary tract infection with metabolic encephalopathy. She was hypokalemic which is better with replacement. She is generally improved Active Problems:   COPD (chronic obstructive pulmonary disease) (HCC)   Chronic systolic congestive heart failure (HCC)   Chronic renal failure   Dementia   Hypokalemia   Acute on chronic systolic congestive heart failure (HCC)   CHF (congestive heart failure) (HCC)   Altered mental status   CHF exacerbation (HCC)   UTI (lower urinary tract infection)    Plan: Continue current treatments. No changes today.    LOS: 1 day   Melissa [name] 11/26/2014, 10:15 AM

## 2014-11-27 LAB — BASIC METABOLIC PANEL
Anion gap: 7 (ref 5–15)
BUN: 13 mg/dL (ref 6–20)
CO2: 28 mmol/L (ref 22–32)
CREATININE: 1.21 mg/dL — AB (ref 0.44–1.00)
Calcium: 9 mg/dL (ref 8.9–10.3)
Chloride: 103 mmol/L (ref 101–111)
GFR calc Af Amer: 45 mL/min — ABNORMAL LOW (ref >60)
GFR, EST NON AFRICAN AMERICAN: 39 mL/min — AB (ref >60)
Glucose, Bld: 104 mg/dL — ABNORMAL HIGH (ref 65–99)
POTASSIUM: 3.9 mmol/L (ref 3.5–5.1)
SODIUM: 138 mmol/L (ref 135–145)

## 2014-11-27 MED ORDER — POTASSIUM CHLORIDE CRYS ER 20 MEQ PO TBCR: 20.0000 meq | EXTENDED_RELEASE_TABLET | Freq: Two times a day (BID) | ORAL | Status: AC

## 2014-11-27 MED ORDER — CEPHALEXIN 500 MG PO CAPS: 500.0000 mg | ORAL_CAPSULE | Freq: Three times a day (TID) | ORAL | Status: AC

## 2014-11-27 NOTE — Progress Notes (Signed)
Patient alert and oriented, independent, VSS, pt. Tolerating diet well. No complaints of pain or nausea. Pt. Had IV removed tip intact. Pt. Had prescriptions given. Pt. Voiced understanding of discharge instructions with no further questions. Pt. Discharged [facility] wheelchair [facility].

## 2014-11-27 NOTE — Progress Notes (Signed)
Subjective: She says she feels okay. She is much less confused. She is lying flat with no difficulty.  Objective: Vital signs in last 24 hours: Temp:  [98.2 F (36.8 C)-99.6 F (37.6 C)] 98.7 F (37.1 C) (11/21 0528) Pulse Rate:  [83-89] 89 (11/21 0528) Resp:  [20-22] 20 (11/21 0528) BP: (124-138)/(61-70) 138/70 mmHg (11/21 0528) SpO2:  [94 %-97 %] 96 % (11/21 0528) Weight change:  Last BM Date: 11/26/14  Intake/Output from previous day: 11/20 0701 - 11/21 0700 In: 600 [P.O.:600] Out: -   PHYSICAL EXAM General appearance: alert, cooperative and no distress Resp: clear to auscultation bilaterally Cardio: regular rate and rhythm, S1, S2 normal, no murmur, click, rub or gallop GI: soft, non-tender; bowel sounds normal; no masses,  no organomegaly Extremities: extremities normal, atraumatic, no cyanosis or edema  Lab Results:  Results for orders placed or performed during the hospital encounter of 11/24/14 (from the past 48 hour(s))  Basic metabolic panel     Status: Abnormal   Collection Time: 11/26/14  6:24 AM  Result Value Ref Range   Sodium 139 135 - 145 mmol/L   Potassium 3.6 3.5 - 5.1 mmol/L   Chloride 106 101 - 111 mmol/L   CO2 26 22 - 32 mmol/L   Glucose, Bld 104 (H) 65 - 99 mg/dL   BUN 11 6 - 20 mg/dL   Creatinine, Ser 1.08 (H) 0.44 - 1.00 mg/dL   Calcium 8.7 (L) 8.9 - 10.3 mg/dL   GFR calc non Af Amer 45 (L) >60 mL/min   GFR calc Af Amer 52 (L) >60 mL/min    Comment: (NOTE) The eGFR has been calculated using the CKD EPI equation. This calculation has not been validated in all clinical situations. eGFR's persistently <60 mL/min signify possible Chronic Kidney Disease.    Anion gap 7 5 - 15  Basic metabolic panel     Status: Abnormal   Collection Time: 11/27/14  6:56 AM  Result Value Ref Range   Sodium 138 135 - 145 mmol/L   Potassium 3.9 3.5 - 5.1 mmol/L   Chloride 103 101 - 111 mmol/L   CO2 28 22 - 32 mmol/L   Glucose, Bld 104 (H) 65 - 99 mg/dL   BUN  13 6 - 20 mg/dL   Creatinine, Ser 1.21 (H) 0.44 - 1.00 mg/dL   Calcium 9.0 8.9 - 10.3 mg/dL   GFR calc non Af Amer 39 (L) >60 mL/min   GFR calc Af Amer 45 (L) >60 mL/min    Comment: (NOTE) The eGFR has been calculated using the CKD EPI equation. This calculation has not been validated in all clinical situations. eGFR's persistently <60 mL/min signify possible Chronic Kidney Disease.    Anion gap 7 5 - 15    ABGS No results for input(s): PHART, PO2ART, TCO2, HCO3 in the last 72 hours.  Invalid input(s): PCO2 CULTURES Recent Results (from the past 240 hour(s))  Urine culture     Status: None (Preliminary result)   Collection Time: 11/24/14  6:13 PM  Result Value Ref Range Status   Specimen Description URINE, CATHETERIZED  Final   Special Requests NONE  Final   Culture   Final    TOO YOUNG TO READ Performed at [facility]    Report Status PENDING  Incomplete  MRSA PCR Screening     Status: None   Collection Time: 11/25/14  2:30 AM  Result Value Ref Range Status   MRSA by PCR NEGATIVE NEGATIVE Final  Comment:        The GeneXpert MRSA Assay (FDA approved for NASAL specimens only), is one component of a comprehensive MRSA colonization surveillance program. It is not intended to diagnose MRSA infection nor to guide or monitor treatment for MRSA infections.    Studies/Results: No results found.  Medications:  Prior to Admission:  Prescriptions prior to admission  Medication Sig Dispense Refill Last Dose  . ALPRAZolam (XANAX) 0.25 MG tablet Take 0.25 mg by mouth 2 (two) times daily as needed for anxiety.   11/24/2014 at 1335  . amLODipine (NORVASC) 10 MG tablet Take 10 mg by mouth daily.     11/24/2014 at 900a  . carvedilol (COREG) 12.5 MG tablet Take 12.5 mg by mouth 2 (two) times daily.   11/24/2014 at 900a  . escitalopram (LEXAPRO) 10 MG tablet Take 10 mg by mouth daily.   11/24/2014 at 900a  . guaiFENesin (MUCINEX) 600 MG 12 hr tablet Take 600 mg by  mouth 2 (two) times daily.   11/24/2014 at 900a  . levETIRAcetam (KEPPRA) 250 MG tablet Take 250 mg by mouth 2 (two) times daily.     11/24/2014 at 900a  . loratadine (CLARITIN) 10 MG tablet Take 10 mg by mouth daily.   11/24/2014 at [place]  . LORazepam (ATIVAN) 0.5 MG tablet Take 0.5 mg by mouth at bedtime.   11/23/2014 at 2100  . magnesium oxide (MAG-OX) 400 MG tablet Take 400 mg by mouth 2 (two) times daily.   11/24/2014 at [place]  . Memantine HCl ER (NAMENDA XR) 28 MG CP24 Take 28 mg by mouth daily.   11/24/2014 at 900a  . omeprazole (PRILOSEC) 20 MG capsule Take 20 mg by mouth daily.   11/24/2014 at [place]  . OXYGEN Inhale 2 L into the lungs continuous.   11/23/2014 at 2000  . potassium chloride SA (K-DUR,KLOR-CON) 20 MEQ tablet Take 20 mEq by mouth daily.   11/24/2014 at [place]  . rivastigmine (EXELON) 6 MG capsule Take 6 mg by mouth daily.   11/24/2014 at 900a  . simethicone (MI-ACID GAS RELIEF) 80 MG chewable tablet Chew 80 mg by mouth 3 (three) times daily after meals.   11/24/2014 at 1300  . torsemide (DEMADEX) 100 MG tablet Take 50 mg by mouth daily.    11/24/2014 at 900a  . traMADol (ULTRAM) 50 MG tablet Take 50 mg by mouth 4 (four) times daily as needed for moderate pain.   11/24/2014 at 402a  . benzonatate (TESSALON) 200 MG capsule Take 200 mg by mouth 3 (three) times daily as needed for cough.    Unknown at Unknown time  . cephALEXin (KEFLEX) 500 MG capsule Take 1 capsule (500 mg total) by mouth 2 (two) times daily. (Patient not taking: Reported on 11/24/2014) 14 capsule 0 Unknown at Unknown time  . furosemide (LASIX) 10 MG/ML injection Inject 2 mLs (20 mg total) into the vein daily. (Patient not taking: Reported on 10/13/2014) 4 mL 0 Unknown at Unknown time  . predniSONE (DELTASONE) 20 MG tablet Take 1 tablet (20 mg total) by mouth daily with breakfast. (Patient not taking: Reported on 10/13/2014) 5 tablet 0 Unknown at Unknown time   Scheduled: . amLODipine  10 mg Oral Daily  . carvedilol   12.5 mg Oral BID  . cefTRIAXone (ROCEPHIN)  IV  1 g Intravenous Q24H  . enoxaparin (LOVENOX) injection  30 mg Subcutaneous Q24H  . escitalopram  10 mg Oral Daily  . feeding supplement (ENSURE ENLIVE)  237 mL  Oral BID BM  . furosemide  20 mg Intravenous Q12H  . guaiFENesin  600 mg Oral BID  . levETIRAcetam  250 mg Oral BID  . loratadine  10 mg Oral Daily  . magnesium oxide  400 mg Oral BID  . memantine  28 mg Oral Daily  . pantoprazole  40 mg Oral Daily  . potassium chloride  20 mEq Oral BID  . rivastigmine  6 mg Oral Daily  . sodium chloride  3 mL Intravenous Q12H   Continuous:  PWX:[id number] chloride, acetaminophen **OR** acetaminophen, albuterol, ALPRAZolam, benzonatate, ondansetron **OR** ondansetron (ZOFRAN) IV, sodium chloride, traMADol  Assesment: She is overall better. She has no new complaints. She was admitted with urinary tract infection, metabolic encephalopathy which is much better and acute on chronic systolic heart failure which has improved. Urine culture is still pending but she is much improved on Rocephin. Active Problems:   COPD (chronic obstructive pulmonary disease) (HCC)   Chronic systolic congestive heart failure (HCC)   Chronic renal failure   Dementia   Hypokalemia   Acute on chronic systolic congestive heart failure (HCC)   CHF (congestive heart failure) (HCC)   Altered mental status   CHF exacerbation (HCC)   UTI (lower urinary tract infection)   Encephalopathy, metabolic    Plan: I think she can go back to the assisted living facility today on an oral cephalosporin pending urine culture    LOS: 2 days   Roderick Sweezy L 11/27/2014, 8:50 AM

## 2014-11-27 NOTE — [place] FL2 (Signed)
[place] MEDICAID FL2 LEVEL OF CARE SCREENING TOOL     IDENTIFICATION  Patient Name: Melissa [name] Birthdate: 1925-06-25 Sex: female Admission Date (Current Location): 11/24/2014  [facility] and [place] Number:     Facility and Address:  [facility],  [address] [place] [address]      Provider Number: [id number]  Attending Physician Name and Address:  Kari Baars, MD  Relative Name and Phone Number:       Current Level of Care: Hospital Recommended Level of Care: Assisted Living Facility Prior Approval Number:    Date Approved/Denied:   PASRR Number:    Discharge Plan: Other (Comment) (Assisted living)    Current Diagnoses: Patient Active Problem List   Diagnosis Date Noted  . Encephalopathy, metabolic 11/26/2014  . Altered mental status 11/24/2014  . CHF exacerbation (HCC) 11/24/2014  . UTI (lower urinary tract infection) 11/24/2014  . CHF (congestive heart failure) (HCC) 09/05/2014  . Acute on chronic respiratory failure (HCC) 09/05/2014  . HOH (hard of hearing) 09/05/2014  . Diabetes (HCC)   . COPD exacerbation (HCC) 06/22/2013  . Acute on chronic systolic congestive heart failure (HCC) 06/16/2011  . Healthcare-associated pneumonia 06/05/2011  . COPD (chronic obstructive pulmonary disease) (HCC) 06/05/2011  . Chronic systolic congestive heart failure (HCC) 06/05/2011  . Peripheral arterial disease (HCC) 06/05/2011  . Chronic renal failure 06/05/2011  . Dementia 06/05/2011  . Hypokalemia 06/05/2011    Orientation ACTIVITIES/SOCIAL BLADDER RESPIRATION    Self  Family supportive Incontinent O2 (As needed) (5L)  BEHAVIORAL SYMPTOMS/MOOD NEUROLOGICAL BOWEL NUTRITION STATUS  Other (Comment) (n/a)  (n/a) Incontinent Diet (heart healthy)  PHYSICIAN VISITS COMMUNICATION OF NEEDS Height & Weight Skin    Verbally  (162.6 cm) 137 lbs. Normal          AMBULATORY STATUS RESPIRATION     (not ambulatory. uses wheelchair) O2 (As needed)  (5L)      Personal Care Assistance Level of Assistance  Bathing, Feeding, Dressing Bathing Assistance: Maximum assistance Feeding assistance: Limited assistance Dressing Assistance: Maximum assistance      Functional Limitations Info  Sight, Hearing, Speech Sight Info: Adequate Hearing Info: Adequate Speech Info: Adequate       SPECIAL CARE FACTORS FREQUENCY   (home health PT)                   Additional Factors Info  Psychotropic Code Status Info: Full code Allergies Info: Shellfish Allergy Psychotropic Info: Xanax, Ativan         Current Medications (11/27/2014): Current Facility-Administered Medications  Medication Dose Route Frequency Provider Last Rate Last Dose  . 0.9 %  sodium chloride infusion  250 mL Intravenous PRN Meredeth Ide, MD 10 mL/hr at 11/26/14 1753 250 mL at 11/26/14 1753  . acetaminophen (TYLENOL) tablet 650 mg  650 mg Oral Q6H PRN Meredeth Ide, MD       Or  . acetaminophen (TYLENOL) suppository 650 mg  650 mg Rectal Q6H PRN Meredeth Ide, MD      . albuterol (PROVENTIL) (2.5 MG/3ML) 0.083% nebulizer solution 2.5 mg  2.5 mg Nebulization Q2H PRN Meredeth Ide, MD   2.5 mg at 11/26/14 0011  . ALPRAZolam Prudy Feeler) tablet 0.25 mg  0.25 mg Oral BID PRN Meredeth Ide, MD   0.25 mg at 11/25/14 2214  . amLODipine (NORVASC) tablet 10 mg  10 mg Oral Daily Meredeth Ide, MD   10 mg at 11/27/14 0756  . benzonatate (TESSALON)  capsule 200 mg  200 mg Oral TID PRN Meredeth Ide, MD      . carvedilol (COREG) tablet 12.5 mg  12.5 mg Oral BID Meredeth Ide, MD   12.5 mg at 11/27/14 0758  . cefTRIAXone (ROCEPHIN) 1 g in dextrose 5 % 50 mL IVPB  1 g Intravenous Q24H Kari Baars, MD   1 g at 11/26/14 2021  . enoxaparin (LOVENOX) injection 30 mg  30 mg Subcutaneous Q24H Meredeth Ide, MD   30 mg at 11/27/14 0756  . escitalopram (LEXAPRO) tablet 10 mg  10 mg Oral Daily Meredeth Ide, MD   10 mg at 11/27/14 0757  . feeding supplement (ENSURE ENLIVE) (ENSURE ENLIVE) liquid 237  mL  237 mL Oral BID BM Kari Baars, MD   237 mL at 11/27/14 0755  . furosemide (LASIX) injection 20 mg  20 mg Intravenous Q12H Meredeth Ide, MD   20 mg at 11/27/14 0506  . guaiFENesin (MUCINEX) 12 hr tablet 600 mg  600 mg Oral BID Meredeth Ide, MD   600 mg at 11/27/14 0757  . levETIRAcetam (KEPPRA) tablet 250 mg  250 mg Oral BID Meredeth Ide, MD   250 mg at 11/27/14 0757  . loratadine (CLARITIN) tablet 10 mg  10 mg Oral Daily Meredeth Ide, MD   10 mg at 11/27/14 0757  . magnesium oxide (MAG-OX) tablet 400 mg  400 mg Oral BID Meredeth Ide, MD   400 mg at 11/27/14 0757  . memantine (NAMENDA XR) 24 hr capsule 28 mg  28 mg Oral Daily Meredeth Ide, MD   28 mg at 11/27/14 0757  . ondansetron (ZOFRAN) tablet 4 mg  4 mg Oral Q6H PRN Meredeth Ide, MD       Or  . ondansetron (ZOFRAN) injection 4 mg  4 mg Intravenous Q6H PRN Meredeth Ide, MD      . pantoprazole (PROTONIX) EC tablet 40 mg  40 mg Oral Daily Meredeth Ide, MD   40 mg at 11/27/14 0757  . potassium chloride SA (K-DUR,KLOR-CON) CR tablet 20 mEq  20 mEq Oral BID Kari Baars, MD   20 mEq at 11/27/14 0757  . rivastigmine (EXELON) capsule 6 mg  6 mg Oral Daily Meredeth Ide, MD   6 mg at 11/27/14 0756  . sodium chloride 0.9 % injection 3 mL  3 mL Intravenous Q12H Meredeth Ide, MD   3 mL at 11/27/14 0758  . sodium chloride 0.9 % injection 3 mL  3 mL Intravenous PRN Meredeth Ide, MD      . traMADol Janean Sark) tablet 50 mg  50 mg Oral QID PRN Meredeth Ide, MD       Do not use this list as official medication orders. Please verify with discharge summary.  Discharge Medications:   Medication List    STOP taking these medications        furosemide 10 MG/ML injection  Commonly known as:  LASIX     predniSONE 20 MG tablet  Commonly known as:  DELTASONE      TAKE these medications        ALPRAZolam 0.25 MG tablet  Commonly known as:  XANAX  Take 0.25 mg by mouth 2 (two) times daily as needed for anxiety.     amLODipine 10 MG tablet   Commonly known as:  NORVASC  Take 10 mg by mouth daily.     benzonatate 200 MG  capsule  Commonly known as:  TESSALON  Take 200 mg by mouth 3 (three) times daily as needed for cough.     carvedilol 12.5 MG tablet  Commonly known as:  COREG  Take 12.5 mg by mouth 2 (two) times daily.     cephALEXin 500 MG capsule  Commonly known as:  KEFLEX  Take 1 capsule (500 mg total) by mouth 3 (three) times daily.     escitalopram 10 MG tablet  Commonly known as:  LEXAPRO  Take 10 mg by mouth daily.     guaiFENesin 600 MG 12 hr tablet  Commonly known as:  MUCINEX  Take 600 mg by mouth 2 (two) times daily.     levETIRAcetam 250 MG tablet  Commonly known as:  KEPPRA  Take 250 mg by mouth 2 (two) times daily.     loratadine 10 MG tablet  Commonly known as:  CLARITIN  Take 10 mg by mouth daily.     LORazepam 0.5 MG tablet  Commonly known as:  ATIVAN  Take 0.5 mg by mouth at bedtime.     magnesium oxide 400 MG tablet  Commonly known as:  MAG-OX  Take 400 mg by mouth 2 (two) times daily.     MI-ACID GAS RELIEF 80 MG chewable tablet  Generic drug:  simethicone  Chew 80 mg by mouth 3 (three) times daily after meals.     NAMENDA XR 28 MG Cp24 24 hr capsule  Generic drug:  memantine  Take 28 mg by mouth daily.     omeprazole 20 MG capsule  Commonly known as:  PRILOSEC  Take 20 mg by mouth daily.     OXYGEN  Inhale 2 L into the lungs continuous.     potassium chloride SA 20 MEQ tablet  Commonly known as:  K-DUR,KLOR-CON  Take 1 tablet (20 mEq total) by mouth 2 (two) times daily.     rivastigmine 6 MG capsule  Commonly known as:  EXELON  Take 6 mg by mouth daily.     torsemide 100 MG tablet  Commonly known as:  DEMADEX  Take 50 mg by mouth daily.     traMADol 50 MG tablet  Commonly known as:  ULTRAM  Take 50 mg by mouth 4 (four) times daily as needed for moderate pain.        Relevant Imaging Results:  Relevant Lab Results:  Recent Labs    Additional  Information home health PT  Karn CassisStultz, Chere Babson [place], [place] [phone]

## 2014-11-27 NOTE — Clinical Social Work Note (Signed)
Clinical Social Work Assessment  Patient Details  Name: Melissa MannersHelen H [name] MRN: [id number] Date of Birth: [birth year]  Date of referral:  11/27/14               Reason for consult:  Discharge Planning                Permission sought to share information with:    Permission granted to share information::     Name::        Agency::     Relationship::     Contact Information:     Housing/Transportation Living arrangements for the past 2 months:  Assisted Living Facility Source of Information:  Adult Children Patient Interpreter Needed:  None Criminal Activity/Legal Involvement Pertinent to Current Situation/Hospitalization:  No - Comment as needed Significant Relationships:  Adult Children Lives with:  Facility Resident Do you feel safe going back to the place where you live?  Yes Need for family participation in patient care:  Yes (Comment)  Care giving concerns:  Pt is long term resident at ALF.    Social Worker assessment / plan:  CSW attempted to meet with pt at bedside. Pt sleeping and is oriented to self only per chart. CSW spoke with pt's son, Melissa [name] who reports plan is to return to [facility] at d/c. He is aware pt is to be d/c today and facility will provide transport. Per Melissa [name] at [facility], pt requires assist with dressing, bathing, and transfers to wheelchair. She has had several falls recently. Aware of home health PT orders. [facility] and is on AL unit. CSW notified facility pt is on 5 liters oxygen currently. Okay to return.   Employment status:  Retired Health and safety inspectornsurance information:  Medicare PT Recommendations:  Not assessed at this time Information / Referral to community resources:  Other (Comment Required) (return [facility])  Patient/Family's Response to care:  Family requests return to [facility].   Patient/Family's Understanding of and Emotional Response to Diagnosis, Current Treatment, and Prognosis: Pt's family is very  involved and aware of health history.   Emotional Assessment Appearance:  Appears stated age Attitude/Demeanor/Rapport:  Unable to Assess Affect (typically observed):  Unable to Assess Orientation:    Alcohol / Substance use:  Not Applicable Psych involvement (Current and /or in the community):  No (Comment)  Discharge Needs  Concerns to be addressed:  Discharge Planning Concerns Readmission within the last 30 days:  No Current discharge risk:  Cognitively Impaired Barriers to Discharge:  No Barriers Identified   Melissa [name], Melissa Besecker [place], LCSW 11/27/2014, 11:53 AM [phone]

## 2014-11-27 NOTE — Care Management Important Message (Signed)
Important Message  Patient Details  Name: Melissa [name] MRN: [id number] Date of Birth: [birth year]   Medicare Important Message Given:  N/A - LOS <3 / Initial given by admissions    Cheryl FlashBlackwell, Loni Delbridge Crowder, RN 11/27/2014, 10:03 AM

## 2014-11-27 NOTE — Discharge Summary (Signed)
Physician Discharge Summary  Patient ID: Melissa [name] MRN: [id number] DOB/AGE: [birth year] 79 y.o. Primary Care Physician:Armin Yerger L, MD Admit date: 11/24/2014 Discharge date: 11/27/2014    Discharge Diagnoses:   Active Problems:   COPD (chronic obstructive pulmonary disease) (HCC)   Chronic systolic congestive heart failure (HCC)   Chronic renal failure   Dementia   Hypokalemia   Acute on chronic systolic congestive heart failure (HCC)   CHF (congestive heart failure) (HCC)   Altered mental status   CHF exacerbation (HCC)   UTI (lower urinary tract infection)   Encephalopathy, metabolic     Medication List    STOP taking these medications        furosemide 10 MG/ML injection  Commonly known as:  LASIX     predniSONE 20 MG tablet  Commonly known as:  DELTASONE      TAKE these medications        ALPRAZolam 0.25 MG tablet  Commonly known as:  XANAX  Take 0.25 mg by mouth 2 (two) times daily as needed for anxiety.     amLODipine 10 MG tablet  Commonly known as:  NORVASC  Take 10 mg by mouth daily.     benzonatate 200 MG capsule  Commonly known as:  TESSALON  Take 200 mg by mouth 3 (three) times daily as needed for cough.     carvedilol 12.5 MG tablet  Commonly known as:  COREG  Take 12.5 mg by mouth 2 (two) times daily.     cephALEXin 500 MG capsule  Commonly known as:  KEFLEX  Take 1 capsule (500 mg total) by mouth 3 (three) times daily.     escitalopram 10 MG tablet  Commonly known as:  LEXAPRO  Take 10 mg by mouth daily.     guaiFENesin 600 MG 12 hr tablet  Commonly known as:  MUCINEX  Take 600 mg by mouth 2 (two) times daily.     levETIRAcetam 250 MG tablet  Commonly known as:  KEPPRA  Take 250 mg by mouth 2 (two) times daily.     loratadine 10 MG tablet  Commonly known as:  CLARITIN  Take 10 mg by mouth daily.     LORazepam 0.5 MG tablet  Commonly known as:  ATIVAN  Take 0.5 mg by mouth at bedtime.     magnesium oxide 400 MG  tablet  Commonly known as:  MAG-OX  Take 400 mg by mouth 2 (two) times daily.     MI-ACID GAS RELIEF 80 MG chewable tablet  Generic drug:  simethicone  Chew 80 mg by mouth 3 (three) times daily after meals.     NAMENDA XR 28 MG Cp24 24 hr capsule  Generic drug:  memantine  Take 28 mg by mouth daily.     omeprazole 20 MG capsule  Commonly known as:  PRILOSEC  Take 20 mg by mouth daily.     OXYGEN  Inhale 2 L into the lungs continuous.     potassium chloride SA 20 MEQ tablet  Commonly known as:  K-DUR,KLOR-CON  Take 1 tablet (20 mEq total) by mouth 2 (two) times daily.     rivastigmine 6 MG capsule  Commonly known as:  EXELON  Take 6 mg by mouth daily.     torsemide 100 MG tablet  Commonly known as:  DEMADEX  Take 50 mg by mouth daily.     traMADol 50 MG tablet  Commonly known as:  ULTRAM  Take 50 mg by mouth  4 (four) times daily as needed for moderate pain.        Discharged Condition: Improved    Consults: None  Significant Diagnostic Studies: Dg Chest 2 View  11/24/2014  CLINICAL DATA:  Altered mental status with fall. EXAM: CHEST  2 VIEW COMPARISON:  10/13/2014 FINDINGS: Cardiomediastinal silhouette is enlarged. Mediastinal contours appear intact. The aorta is torturous and contains atherosclerotic calcifications. There is no evidence of focal airspace consolidation, or pneumothorax. The interstitial markings are thickened with lower lobe predominance. There are bilateral pleural effusions. Osseous structures are without acute abnormality. Soft tissues are grossly normal. IMPRESSION: Enlarged cardiac silhouette. Findings suggestive of pulmonary edema with bilateral pleural effusions. Electronically Signed   By: Ted Mcalpine M.D.   On: 11/24/2014 17:41   Ct Head Wo Contrast  11/24/2014  CLINICAL DATA:  Patient was agitated. Patient status post fall out of bed. Initial encounter. EXAM: CT HEAD WITHOUT CONTRAST CT CERVICAL SPINE WITHOUT CONTRAST TECHNIQUE:  Multidetector CT imaging of the head and cervical spine was performed following the standard protocol without intravenous contrast. Multiplanar CT image reconstructions of the cervical spine were also generated. COMPARISON:  CT brain 10/13/2014; brain and C-spine 09/23/2014. FINDINGS: CT HEAD FINDINGS Ventricles and sulci are prominent compatible with atrophy. Periventricular and subcortical white matter hypodensity compatible with chronic small vessel ischemic changes. No evidence for acute cortically based infarct, intracranial hemorrhage, mass lesion or mass-effect. Mild mucosal thickening ethmoid air cells. Fluid within the sphenoid sinus. Mastoid air cells are unremarkable. Calvarium is intact. CT CERVICAL SPINE FINDINGS No evidence for acute fracture or dislocation. Reversal of the normal cervical lordosis, re- demonstrated. C2-3 facet fusion. Prevertebral soft tissues are unremarkable. There are ground-glass and consolidative opacities involving the visualized left upper lobe. IMPRESSION: No acute intracranial process. No acute cervical spine fracture. Chronic microvascular ischemic changes. Degenerative changes and reversal of the normal cervical lordosis involving the cervical spine. Ground-glass and consolidative opacities within the left upper lobe. This is concerning for an acute infectious process or possibly pulmonary edema. Electronically Signed   By: Annia Belt M.D.   On: 11/24/2014 18:05   Ct Cervical Spine Wo Contrast  11/24/2014  CLINICAL DATA:  Patient was agitated. Patient status post fall out of bed. Initial encounter. EXAM: CT HEAD WITHOUT CONTRAST CT CERVICAL SPINE WITHOUT CONTRAST TECHNIQUE: Multidetector CT imaging of the head and cervical spine was performed following the standard protocol without intravenous contrast. Multiplanar CT image reconstructions of the cervical spine were also generated. COMPARISON:  CT brain 10/13/2014; brain and C-spine 09/23/2014. FINDINGS: CT HEAD  FINDINGS Ventricles and sulci are prominent compatible with atrophy. Periventricular and subcortical white matter hypodensity compatible with chronic small vessel ischemic changes. No evidence for acute cortically based infarct, intracranial hemorrhage, mass lesion or mass-effect. Mild mucosal thickening ethmoid air cells. Fluid within the sphenoid sinus. Mastoid air cells are unremarkable. Calvarium is intact. CT CERVICAL SPINE FINDINGS No evidence for acute fracture or dislocation. Reversal of the normal cervical lordosis, re- demonstrated. C2-3 facet fusion. Prevertebral soft tissues are unremarkable. There are ground-glass and consolidative opacities involving the visualized left upper lobe. IMPRESSION: No acute intracranial process. No acute cervical spine fracture. Chronic microvascular ischemic changes. Degenerative changes and reversal of the normal cervical lordosis involving the cervical spine. Ground-glass and consolidative opacities within the left upper lobe. This is concerning for an acute infectious process or possibly pulmonary edema. Electronically Signed   By: Annia Belt M.D.   On: 11/24/2014 18:05    Lab Results:  Basic Metabolic Panel:  Recent Labs  78/29/5611/20/16 0624 11/27/14 0656  NA 139 138  K 3.6 3.9  CL 106 103  CO2 26 28  GLUCOSE 104* 104*  BUN 11 13  CREATININE 1.08* 1.21*  CALCIUM 8.7* 9.0   Liver Function Tests:  Recent Labs  11/24/14 1615 11/25/14 0633  AST 19 18  ALT 14 13*  ALKPHOS 138* 127*  BILITOT 0.6 0.6  PROT 7.2 6.4*  ALBUMIN 3.6 3.1*     CBC:  Recent Labs  11/24/14 1615 11/25/14 0633  WBC 8.1 6.3  NEUTROABS 5.4  --   HGB 12.3 11.7*  HCT 40.1 37.2  MCV 81.2 80.5  PLT 259 235    Recent Results (from the past 240 hour(s))  Urine culture     Status: None (Preliminary result)   Collection Time: 11/24/14  6:13 PM  Result Value Ref Range Status   Specimen Description URINE, CATHETERIZED  Final   Special Requests NONE  Final   Culture    Final    TOO YOUNG TO READ Performed at [facility]    Report Status PENDING  Incomplete  MRSA PCR Screening     Status: None   Collection Time: 11/25/14  2:30 AM  Result Value Ref Range Status   MRSA by PCR NEGATIVE NEGATIVE Final    Comment:        The GeneXpert MRSA Assay (FDA approved for NASAL specimens only), is one component of a comprehensive MRSA colonization surveillance program. It is not intended to diagnose MRSA infection nor to guide or monitor treatment for MRSA infections.      Hospital Course: This is an 81 year old who lives at an assisted living facility and was brought to the emergency department because of altered mental status. She had recently had a urinary tract infection which had cleared. When she was seen in the emergency department she had abnormal urinalysis and had chest x-ray suggestive of pulmonary edema and she was admitted for treatment. She was begun on IV diuretics and on Rocephin. She has improved. Urine culture is still pending since she has improved on Rocephin presumably she will be able to be treated with an oral cephalosporin. Her potassium level was low and was replaced. She had metabolic encephalopathy which has cleared. She is back at her baseline dementia. She is now able to lie flat in the bed without any shortness of breath  Discharge Exam: Blood pressure 138/70, pulse 89, temperature 98.7 F (37.1 C), temperature source Oral, resp. rate 20, height 5\' 4"  (1.626 m), weight 62.143 kg (137 lb), SpO2 96 %. She is awake and alert. Her chest is clear. Her heart is regular. She is back at baseline dementia  Disposition: Return to the assisted living facility.      Discharge Instructions    Discharge patient    Complete by:  As directed              Signed: Janeth Terry L   11/27/2014, 8:58 AM

## 2014-11-27 NOTE — Care Management Note (Signed)
Case Management Note  Patient Details  Name: Melissa [name] MRN: [id number] Date of Birth: [birth year]  Subjective/Objective:                  Pt [facility] with UTI and altered mental status. Anticipate discharge to back to facility.  Action/Plan: CSW is aware and will arrange discharge [facility]. [facility] to provide PT inhouse.  Expected Discharge Date:                  Expected Discharge Plan:  Assisted Living / Rest Home  In-House Referral:  Clinical Social Work  Discharge planning Services  CM Consult  Post Acute Care Choice:  NA Choice offered to:  NA  DME Arranged:    DME Agency:     HH Arranged:    HH Agency:     Status of Service:  Completed, signed off  Medicare Important Message Given:    Date Medicare IM Given:    Medicare IM give by:    Date Additional Medicare IM Given:    Additional Medicare Important Message give by:     If discussed at Long Length of Stay Meetings, dates discussed:    Additional Comments:  Cheryl FlashBlackwell, Mitch Arquette Crowder, RN 11/27/2014, 9:58 AM

## 2014-11-28 LAB — URINE CULTURE

## 2014-12-08 ENCOUNTER — Encounter (HOSPITAL_COMMUNITY)
Admission: RE | Admit: 2014-12-08 | Discharge: 2014-12-08 | Disposition: A | Discharge status: Home / Self Care | Payer: Medicare Other | Source: Skilled Nursing Facility | Attending: Pulmonary Disease | Admitting: Pulmonary Disease

## 2014-12-08 DIAGNOSIS — N390 Urinary tract infection, site not specified: Secondary | ICD-10-CM | POA: Insufficient documentation

## 2014-12-08 DIAGNOSIS — I129 Hypertensive chronic kidney disease with stage 1 through stage 4 chronic kidney disease, or unspecified chronic kidney disease: Secondary | ICD-10-CM | POA: Insufficient documentation

## 2014-12-08 DIAGNOSIS — E119 Type 2 diabetes mellitus without complications: Secondary | ICD-10-CM | POA: Diagnosis present

## 2014-12-08 LAB — BASIC METABOLIC PANEL
Anion gap: 11 (ref 5–15)
BUN: 19 mg/dL (ref 6–20)
CHLORIDE: 100 mmol/L — AB (ref 101–111)
CO2: 27 mmol/L (ref 22–32)
CREATININE: 1.43 mg/dL — AB (ref 0.44–1.00)
Calcium: 8.9 mg/dL (ref 8.9–10.3)
GFR calc Af Amer: 37 mL/min — ABNORMAL LOW (ref >60)
GFR calc non Af Amer: 32 mL/min — ABNORMAL LOW (ref >60)
Glucose, Bld: 106 mg/dL — ABNORMAL HIGH (ref 65–99)
Potassium: 3.6 mmol/L (ref 3.5–5.1)
Sodium: 138 mmol/L (ref 135–145)

## 2014-12-08 LAB — URINALYSIS, ROUTINE W REFLEX MICROSCOPIC
BILIRUBIN URINE: NEGATIVE
Glucose, UA: NEGATIVE mg/dL
HGB URINE DIPSTICK: NEGATIVE
Ketones, ur: NEGATIVE mg/dL
Nitrite: NEGATIVE
Protein, ur: 30 mg/dL — AB
SPECIFIC GRAVITY, URINE: 1.02 (ref 1.005–1.030)
pH: 6 (ref 5.0–8.0)

## 2014-12-08 LAB — URINE MICROSCOPIC-ADD ON
RBC / HPF: NONE SEEN RBC/hpf (ref 0–5)
SQUAMOUS EPITHELIAL / LPF: NONE SEEN

## 2014-12-08 LAB — CBC WITH DIFFERENTIAL/PLATELET
Basophils Absolute: 0.1 10*3/uL (ref 0.0–0.1)
Basophils Relative: 1 %
EOS ABS: 0.4 10*3/uL (ref 0.0–0.7)
EOS PCT: 5 %
HCT: 38.5 % (ref 36.0–46.0)
HEMOGLOBIN: 12 g/dL (ref 12.0–15.0)
LYMPHS ABS: 1.8 10*3/uL (ref 0.7–4.0)
Lymphocytes Relative: 24 %
MCH: 25.2 pg — AB (ref 26.0–34.0)
MCHC: 31.2 g/dL (ref 30.0–36.0)
MCV: 80.9 fL (ref 78.0–100.0)
MONOS PCT: 9 %
Monocytes Absolute: 0.7 10*3/uL (ref 0.1–1.0)
Neutro Abs: 4.5 10*3/uL (ref 1.7–7.7)
Neutrophils Relative %: 61 %
PLATELETS: 283 10*3/uL (ref 150–400)
RBC: 4.76 MIL/uL (ref 3.87–5.11)
RDW: 15.3 % (ref 11.5–15.5)
WBC: 7.3 10*3/uL (ref 4.0–10.5)

## 2014-12-09 LAB — HEMOGLOBIN A1C
HEMOGLOBIN A1C: 5.9 % — AB (ref 4.8–5.6)
MEAN PLASMA GLUCOSE: 123 mg/dL

## 2014-12-10 LAB — URINE CULTURE

## 2015-01-07 DEATH — deceased

## 2015-06-21 ENCOUNTER — Ambulatory Visit (INDEPENDENT_AMBULATORY_CARE_PROVIDER_SITE_OTHER): Payer: Medicare Other | Admitting: Otolaryngology

## 2015-12-04 IMAGING — DX DG CHEST 2V
2 series · 2 of 2 positions shown · non-contrast
Comparison: 10/13/2014

CLINICAL DATA: Altered mental status with fall.

EXAM:
CHEST  2 VIEW

[chest lat]
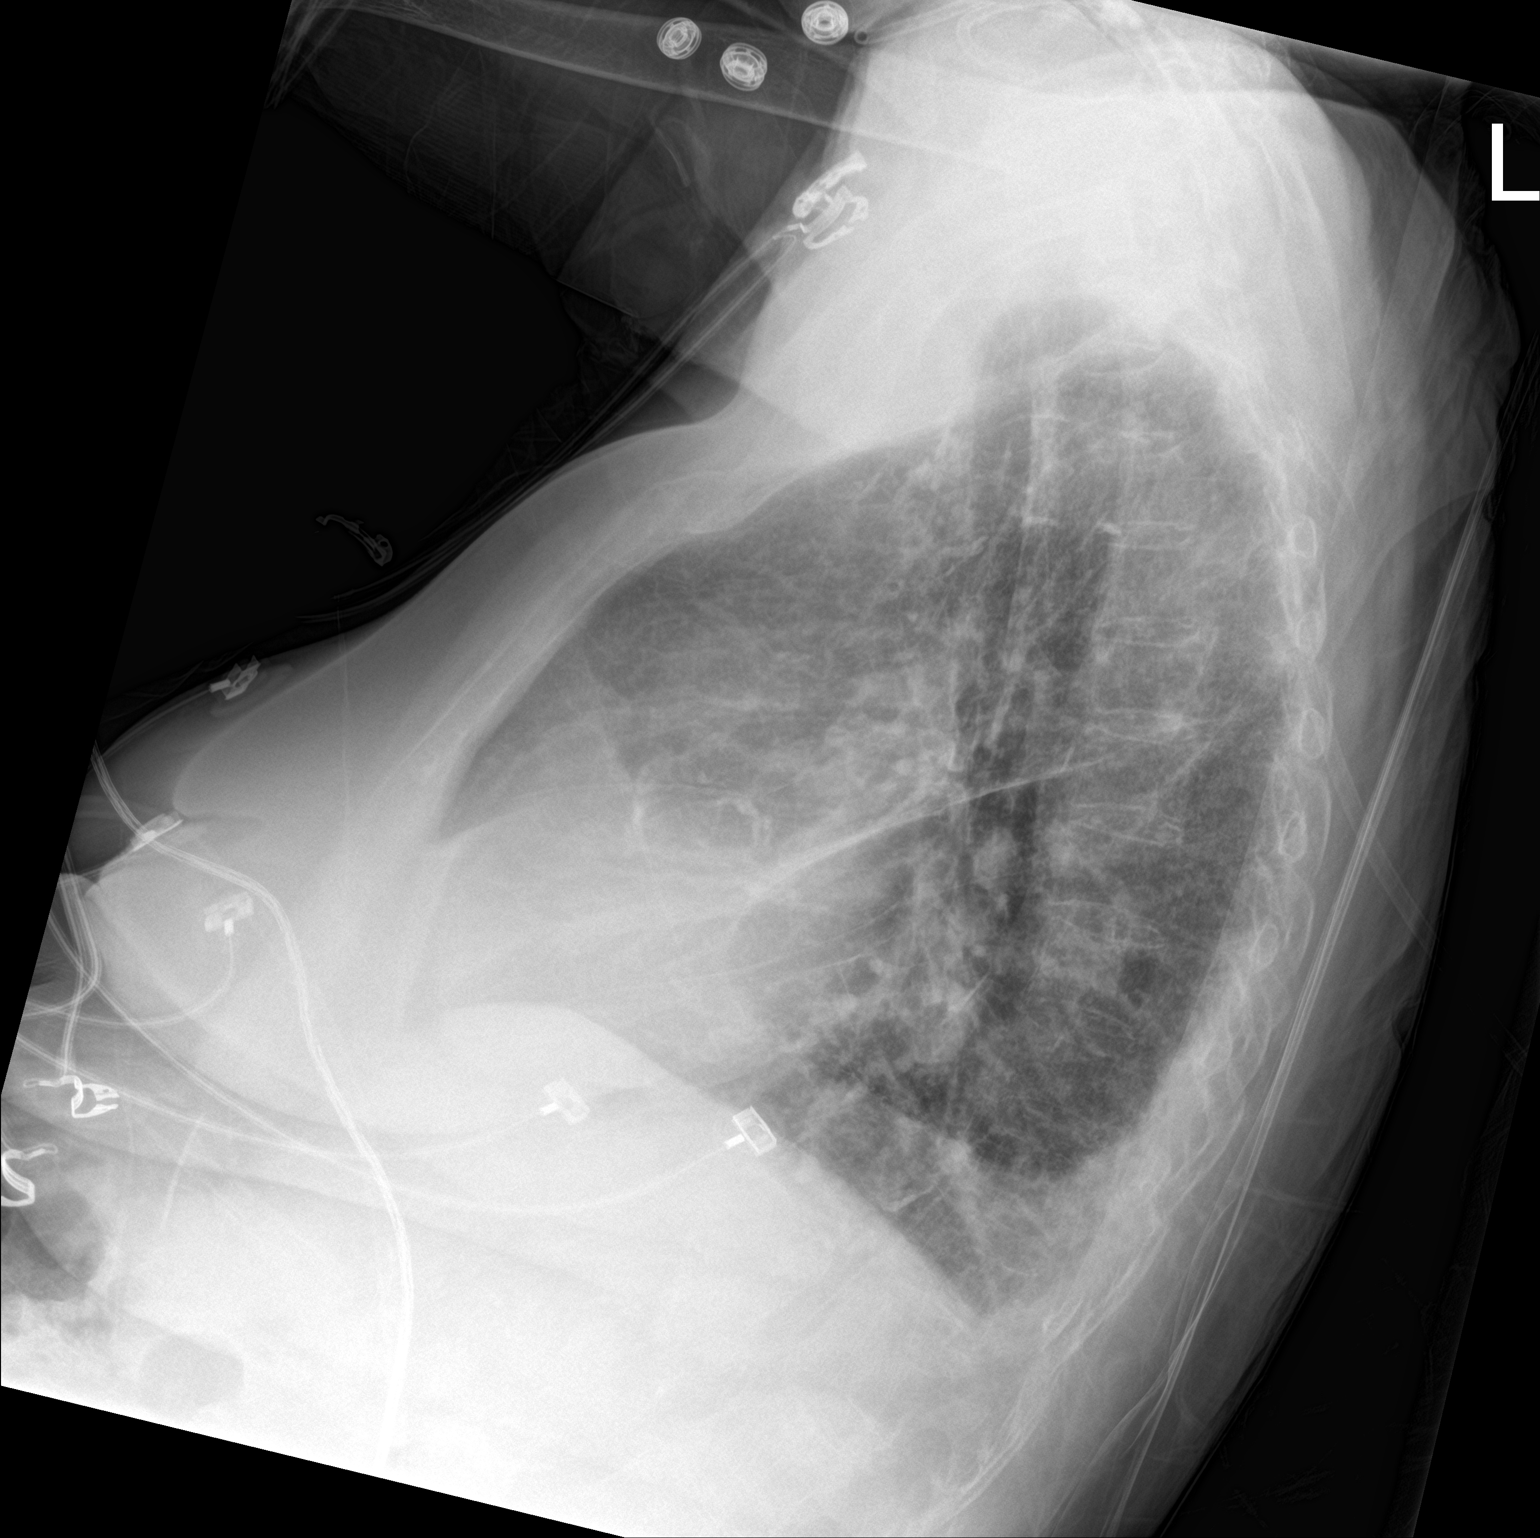

[chest ap]
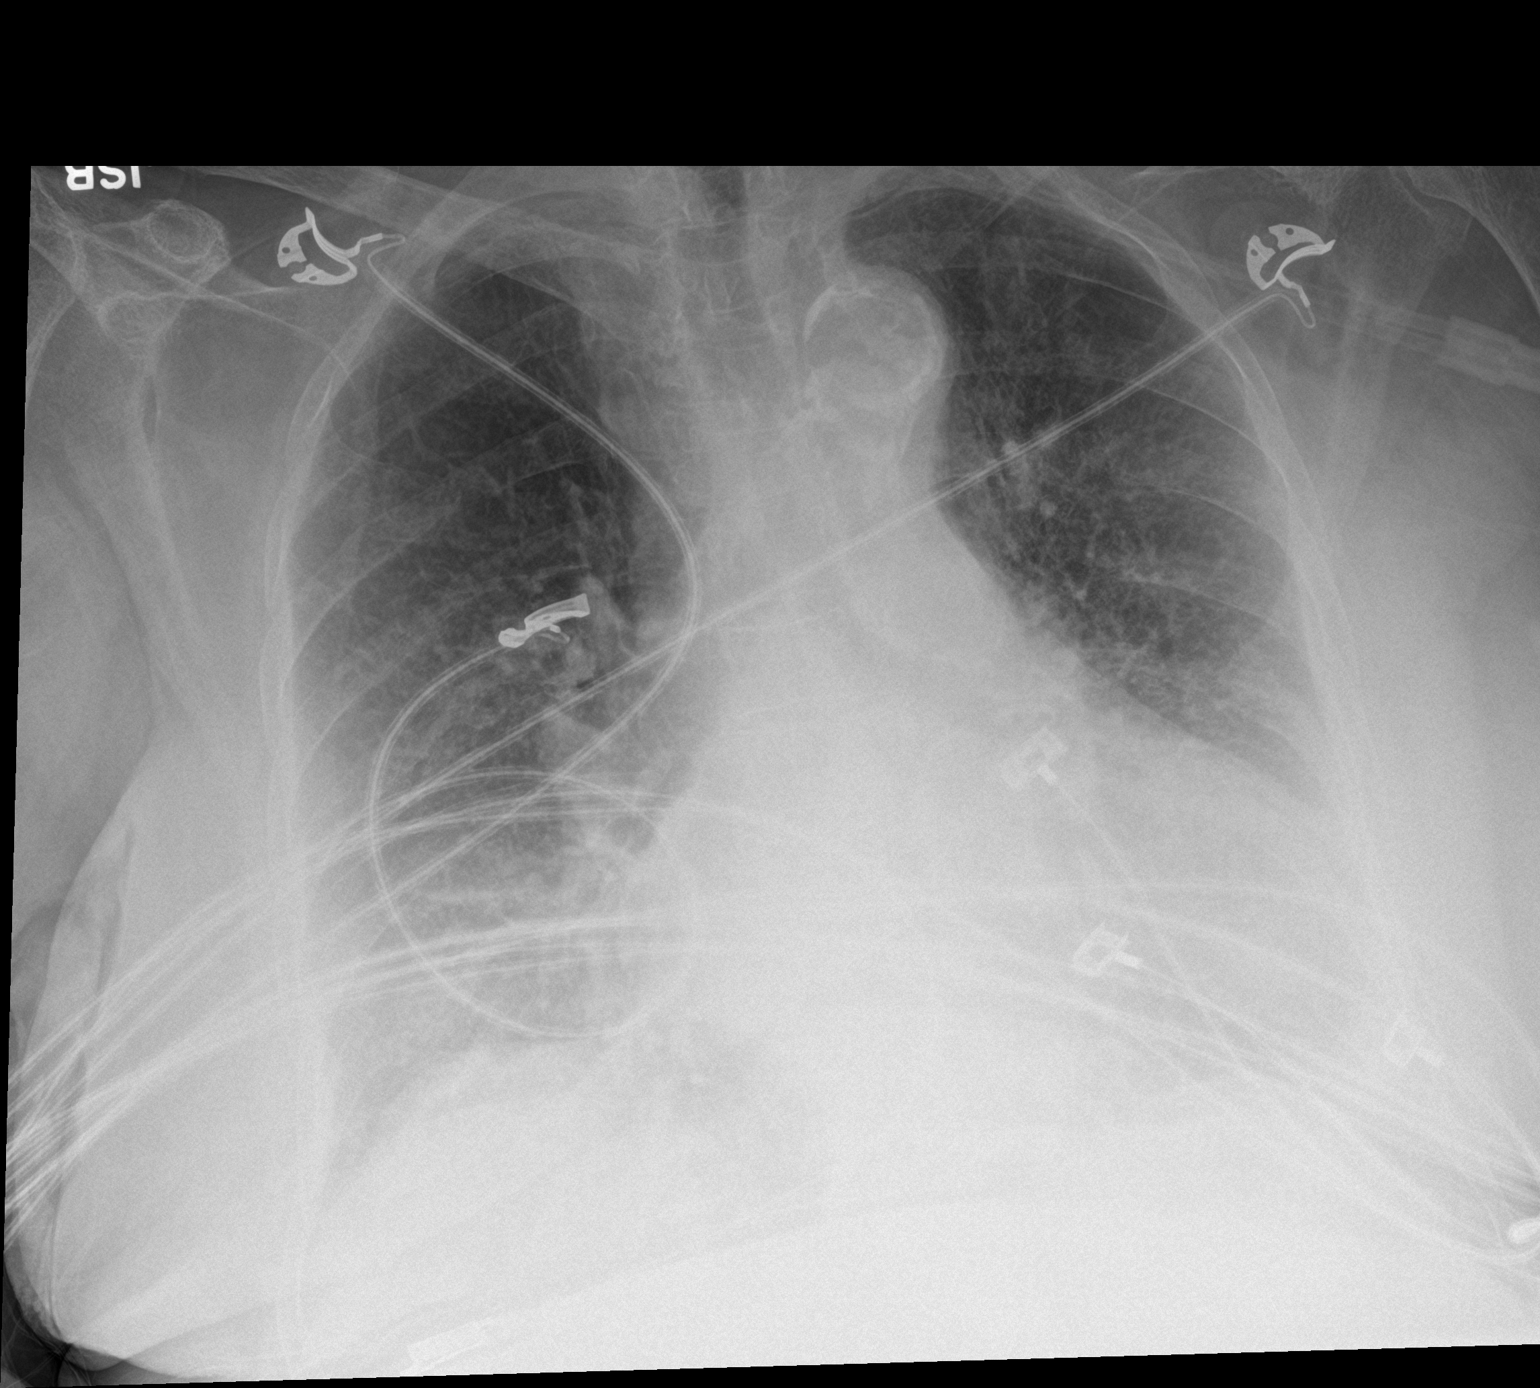

[2 of 2 positions shown; findings below may reference images not displayed]

FINDINGS: Cardiomediastinal silhouette is enlarged. Mediastinal contours
appear intact. The aorta is torturous and contains atherosclerotic
calcifications.

There is no evidence of focal airspace consolidation, or
pneumothorax. The interstitial markings are thickened with lower
lobe predominance. There are bilateral pleural effusions.

Osseous structures are without acute abnormality. Soft tissues are
grossly normal.
IMPRESSION: Enlarged cardiac silhouette.

Findings suggestive of pulmonary edema with bilateral pleural
effusions.

## 2015-12-04 IMAGING — CT CT HEAD W/O CM
4 of 6 series · 11 of 47 positions shown, 12 images · non-contrast
Comparison: CT brain 10/13/2014; brain and C-spine 09/23/2014.

CLINICAL DATA: Patient was agitated. Patient status post fall out
of bed. Initial encounter.

EXAM:
CT HEAD WITHOUT CONTRAST
CT CERVICAL SPINE WITHOUT CONTRAST
TECHNIQUE: Multidetector CT imaging of the head and cervical spine was
performed following the standard protocol without intravenous
contrast. Multiplanar CT image reconstructions of the cervical spine
were also generated.

[Series 2: headseq 4.8 h37s · axial · 0.43mm/px · z∈[+156,+204]mm · 2 of 30 slices shown, 3 images]
[im 10/30  brain]
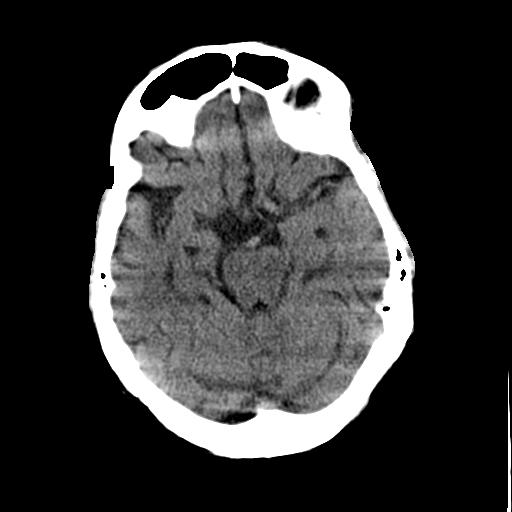
[im 10/30  bone]
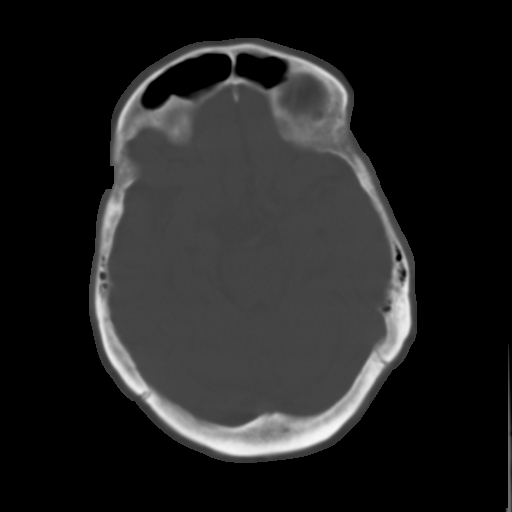
[im 20/30  brain]
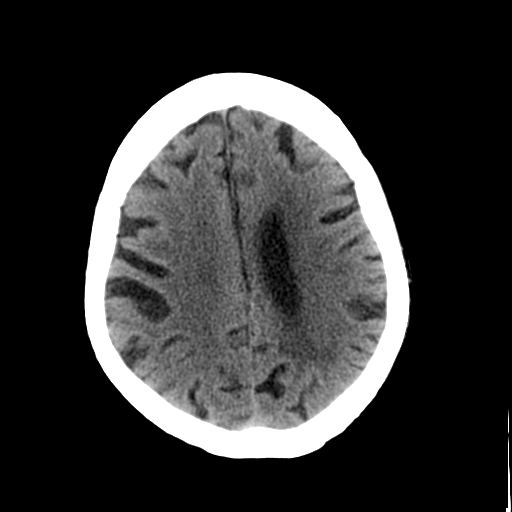

[Series 9: sagittal bone 2.0 · sagittal · 0.19mm/px · 3 of 60 slices shown]
[im 20/60  brain]
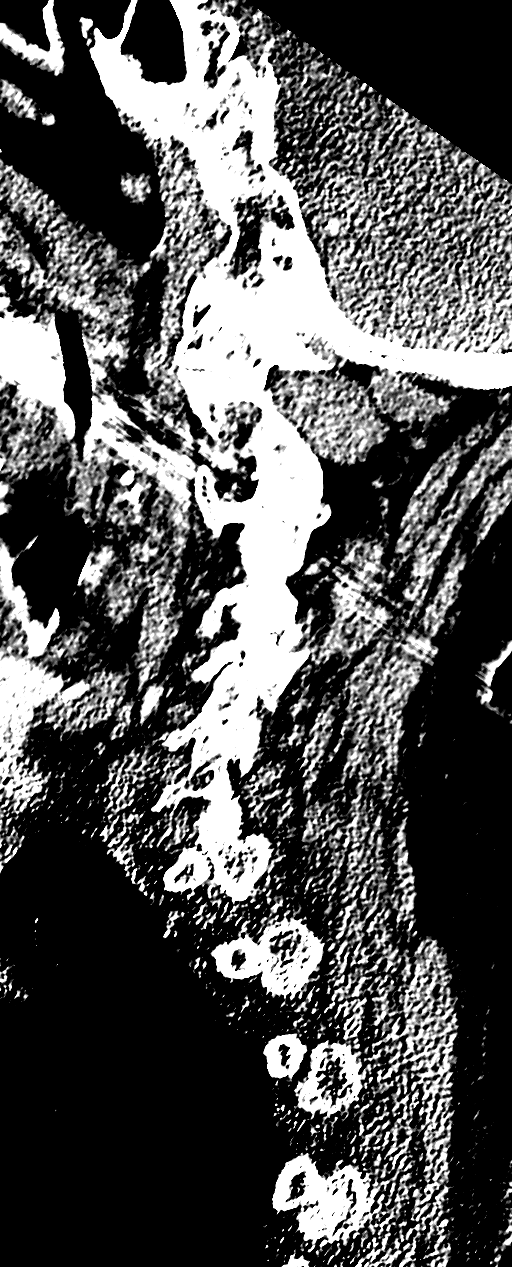
[im 30/60  brain]
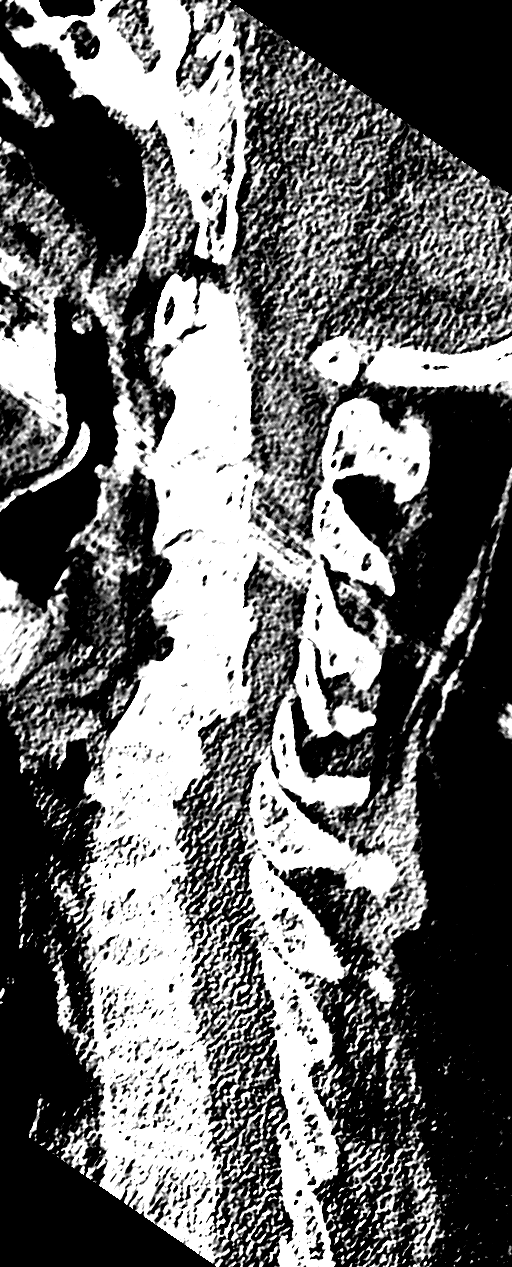
[im 40/60  brain]
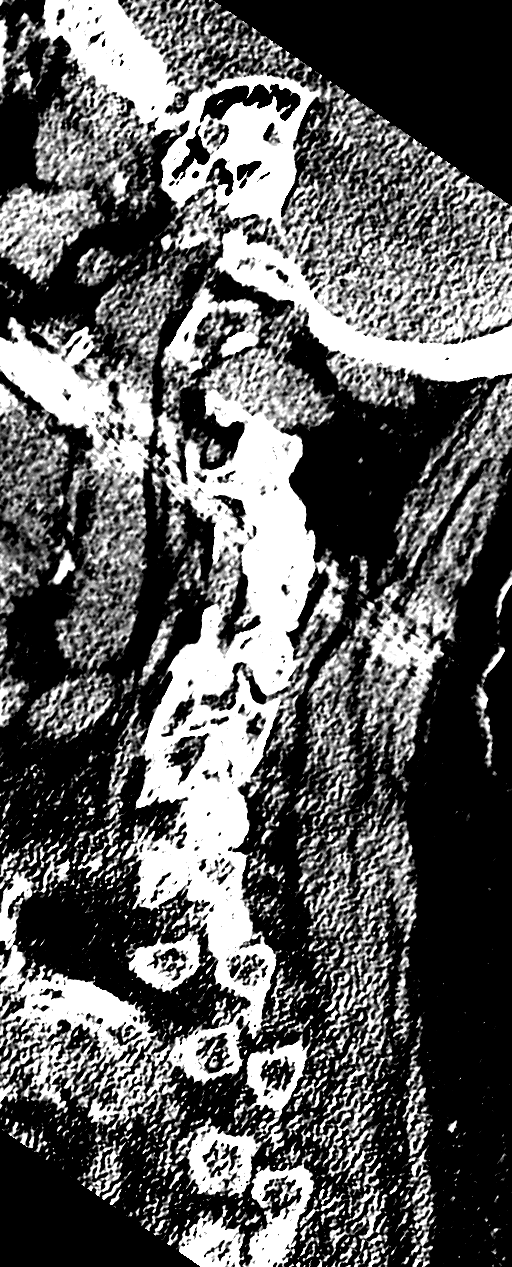

[Series 10: coronal bone 2.0 · coronal · 0.25mm/px · 3 of 54 slices shown]
[im 18/54  brain]
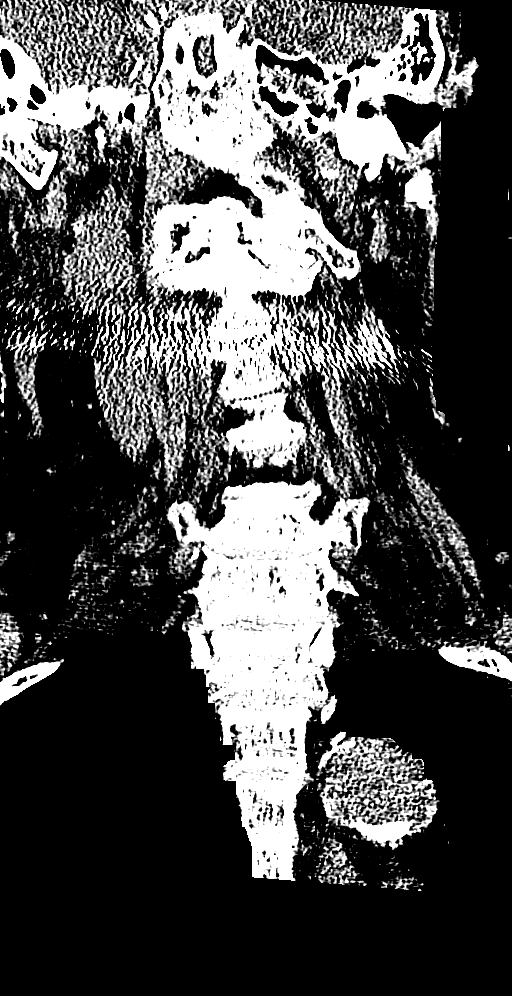
[im 24/54  brain]
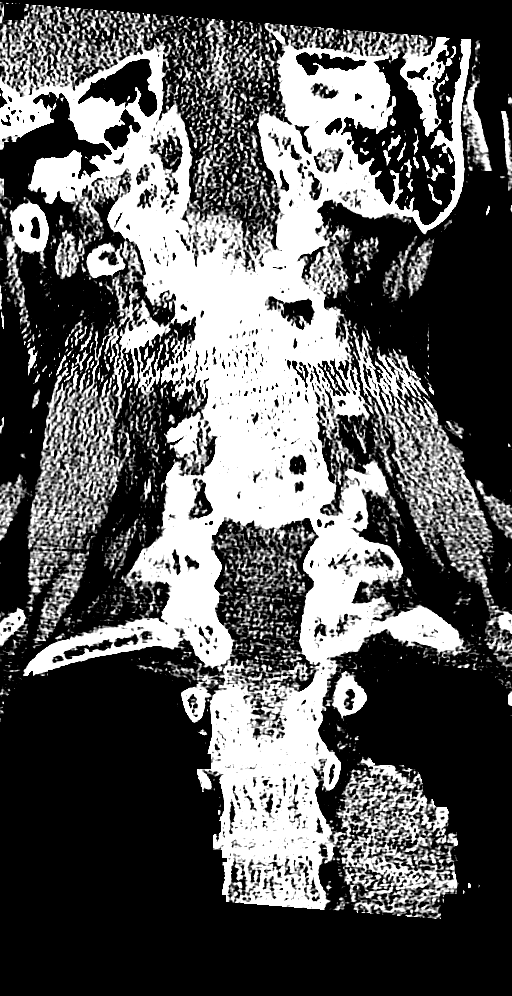
[im 30/54  brain]
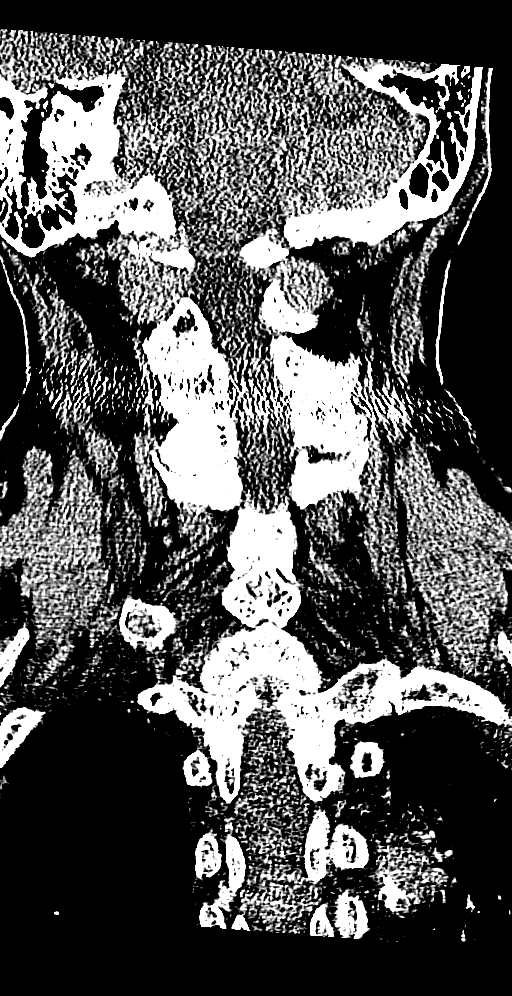

[Series 11: axial bone 2.0 · axial · 0.22mm/px · z∈[-70,-24]mm · 3 of 102 slices shown]
[im 9/102  bone]
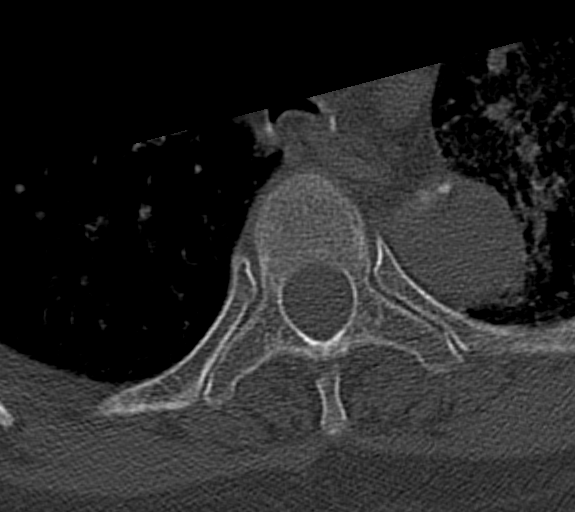
[im 26/102  bone]
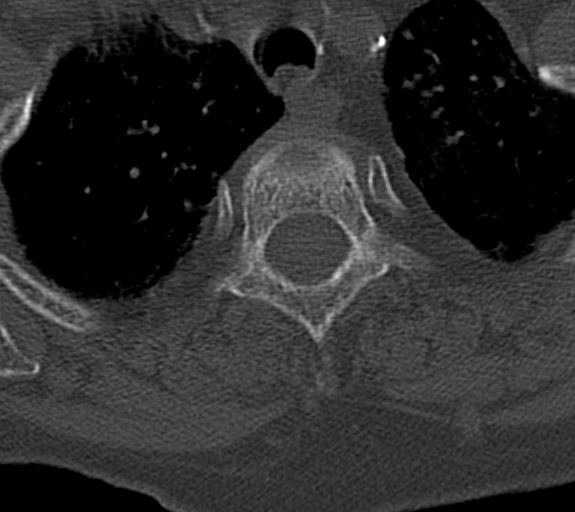
[im 34/102  bone]
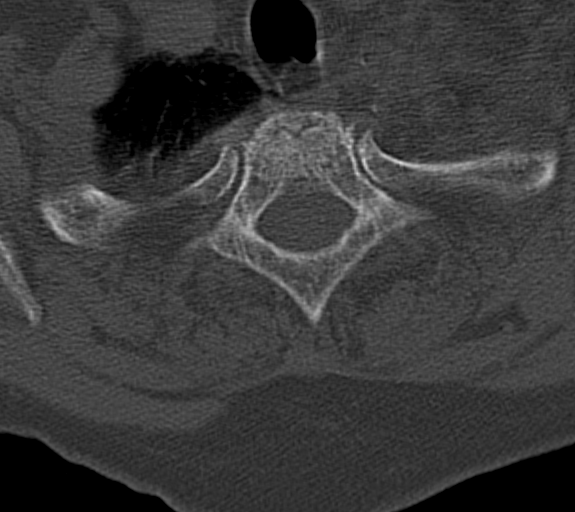

[11 of 47 positions shown; findings below may reference images not displayed]

FINDINGS: CT HEAD FINDINGS

Ventricles and sulci are prominent compatible with atrophy.
Periventricular and subcortical white matter hypodensity compatible
with chronic small vessel ischemic changes. No evidence for acute
cortically based infarct, intracranial hemorrhage, mass lesion or
mass-effect. Mild mucosal thickening ethmoid air cells. Fluid within
the sphenoid sinus. Mastoid air cells are unremarkable. Calvarium is
intact.

CT CERVICAL SPINE FINDINGS

No evidence for acute fracture or dislocation. Reversal of the
normal cervical lordosis, re- demonstrated. C2-3 facet fusion.
Prevertebral soft tissues are unremarkable. There are ground-glass
and consolidative opacities involving the visualized left upper
lobe.
IMPRESSION: No acute intracranial process.

No acute cervical spine fracture.

Chronic microvascular ischemic changes.

Degenerative changes and reversal of the normal cervical lordosis
involving the cervical spine.

Ground-glass and consolidative opacities within the left upper lobe.
This is concerning for an acute infectious process or possibly
pulmonary edema.
